# Patient Record
Sex: Female | Born: 1985 | Race: Black or African American | Hispanic: No | Marital: Single | Smoking: Never smoker
Health system: Southern US, Community
[De-identification: ages and names within clinical notes are randomized; demographics above are authoritative.]

## PROBLEM LIST (undated history)

## (undated) ENCOUNTER — Emergency Department (HOSPITAL_COMMUNITY): Admission: EM | Payer: No Typology Code available for payment source | Source: Home / Self Care

## (undated) DIAGNOSIS — F200 Paranoid schizophrenia: Secondary | ICD-10-CM

## (undated) DIAGNOSIS — I671 Cerebral aneurysm, nonruptured: Secondary | ICD-10-CM

## (undated) DIAGNOSIS — F79 Unspecified intellectual disabilities: Secondary | ICD-10-CM

## (undated) DIAGNOSIS — R011 Cardiac murmur, unspecified: Secondary | ICD-10-CM

## (undated) HISTORY — PX: BRAIN SURGERY: SHX531

---
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
## 2005-11-07 ENCOUNTER — Emergency Department (HOSPITAL_COMMUNITY): Admission: EM | Admit: 2005-11-07 | Discharge: 2005-11-07 | Payer: Self-pay | Admitting: Family Medicine

## 2007-09-28 ENCOUNTER — Emergency Department (HOSPITAL_COMMUNITY): Admission: EM | Admit: 2007-09-28 | Discharge: 2007-09-28 | Payer: Self-pay | Admitting: Emergency Medicine

## 2009-01-07 ENCOUNTER — Emergency Department (HOSPITAL_COMMUNITY): Admission: EM | Admit: 2009-01-07 | Discharge: 2009-01-08 | Payer: Self-pay | Admitting: Emergency Medicine

## 2010-07-25 ENCOUNTER — Encounter: Payer: Self-pay | Admitting: Orthopedic Surgery

## 2010-12-14 ENCOUNTER — Emergency Department (HOSPITAL_COMMUNITY)
Admission: EM | Admit: 2010-12-14 | Discharge: 2010-12-14 | Disposition: A | Discharge status: Home / Self Care | Payer: No Typology Code available for payment source | Attending: Emergency Medicine | Admitting: Emergency Medicine

## 2010-12-14 DIAGNOSIS — Y9229 Other specified public building as the place of occurrence of the external cause: Secondary | ICD-10-CM | POA: Insufficient documentation

## 2010-12-14 DIAGNOSIS — S0003XA Contusion of scalp, initial encounter: Secondary | ICD-10-CM | POA: Insufficient documentation

## 2010-12-14 DIAGNOSIS — W208XXA Other cause of strike by thrown, projected or falling object, initial encounter: Secondary | ICD-10-CM | POA: Insufficient documentation

## 2010-12-14 DIAGNOSIS — E669 Obesity, unspecified: Secondary | ICD-10-CM | POA: Insufficient documentation

## 2010-12-14 DIAGNOSIS — S1093XA Contusion of unspecified part of neck, initial encounter: Secondary | ICD-10-CM | POA: Insufficient documentation

## 2010-12-14 DIAGNOSIS — Y9301 Activity, walking, marching and hiking: Secondary | ICD-10-CM | POA: Insufficient documentation

## 2014-09-17 ENCOUNTER — Encounter (HOSPITAL_COMMUNITY): Payer: Self-pay | Admitting: *Deleted

## 2014-09-17 ENCOUNTER — Emergency Department (HOSPITAL_COMMUNITY)
Admission: EM | Admit: 2014-09-17 | Discharge: 2014-09-17 | Disposition: A | Discharge status: Home / Self Care | Payer: No Typology Code available for payment source | Attending: Emergency Medicine | Admitting: Emergency Medicine

## 2014-09-17 ENCOUNTER — Emergency Department (HOSPITAL_COMMUNITY): Payer: No Typology Code available for payment source

## 2014-09-17 DIAGNOSIS — S5001XA Contusion of right elbow, initial encounter: Secondary | ICD-10-CM | POA: Insufficient documentation

## 2014-09-17 DIAGNOSIS — Y9289 Other specified places as the place of occurrence of the external cause: Secondary | ICD-10-CM | POA: Insufficient documentation

## 2014-09-17 DIAGNOSIS — Y9389 Activity, other specified: Secondary | ICD-10-CM | POA: Insufficient documentation

## 2014-09-17 DIAGNOSIS — Z79899 Other long term (current) drug therapy: Secondary | ICD-10-CM | POA: Insufficient documentation

## 2014-09-17 DIAGNOSIS — R011 Cardiac murmur, unspecified: Secondary | ICD-10-CM | POA: Insufficient documentation

## 2014-09-17 DIAGNOSIS — Y998 Other external cause status: Secondary | ICD-10-CM | POA: Insufficient documentation

## 2014-09-17 DIAGNOSIS — S0990XA Unspecified injury of head, initial encounter: Secondary | ICD-10-CM

## 2014-09-17 DIAGNOSIS — S40022A Contusion of left upper arm, initial encounter: Secondary | ICD-10-CM

## 2014-09-17 DIAGNOSIS — S0011XA Contusion of right eyelid and periocular area, initial encounter: Secondary | ICD-10-CM | POA: Insufficient documentation

## 2014-09-17 DIAGNOSIS — Z8679 Personal history of other diseases of the circulatory system: Secondary | ICD-10-CM | POA: Insufficient documentation

## 2014-09-17 HISTORY — DX: Cerebral aneurysm, nonruptured: I67.1

## 2014-09-17 HISTORY — DX: Cardiac murmur, unspecified: R01.1

## 2014-09-17 MED ORDER — OXYCODONE-ACETAMINOPHEN 5-325 MG PO TABS
1.0000 | ORAL_TABLET | Freq: Once | ORAL | Status: AC
  Administered 2014-09-17: 1 via ORAL
  Filled 2014-09-17: qty 1
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
## 2014-09-17 NOTE — ED Notes (Signed)
Note:  She declines v.s., nor any other intervention.

## 2014-09-17 NOTE — Discharge Instructions (Signed)
Head Injury °You have received a head injury. It does not appear serious at this time. Headaches and vomiting are common following head injury. It should be easy to awaken from sleeping. Sometimes it is necessary for you to stay in the emergency department for a while for observation. Sometimes admission to the hospital may be needed. After injuries such as yours, most problems occur within the first 24 hours, but side effects may occur up to 7-10 days after the injury. It is important for you to carefully monitor your condition and contact your health care provider or seek immediate medical care if there is a change in your condition. °WHAT ARE THE TYPES OF HEAD INJURIES? °Head injuries can be as minor as a bump. Some head injuries can be more severe. More severe head injuries include: °· A jarring injury to the brain (concussion). °· A bruise of the brain (contusion). This mean there is bleeding in the brain that can cause swelling. °· A cracked skull (skull fracture). °· Bleeding in the brain that collects, clots, and forms a bump (hematoma). °WHAT CAUSES A HEAD INJURY? °A serious head injury is most likely to happen to someone who is in a car wreck and is not wearing a seat belt. Other causes of major head injuries include bicycle or motorcycle accidents, sports injuries, and falls. °HOW ARE HEAD INJURIES DIAGNOSED? °A complete history of the event leading to the injury and your current symptoms will be helpful in diagnosing head injuries. Many times, pictures of the brain, such as CT or MRI are needed to see the extent of the injury. Often, an overnight hospital stay is necessary for observation.  °WHEN SHOULD I SEEK IMMEDIATE MEDICAL CARE?  °You should get help right away if: °· You have confusion or drowsiness. °· You feel sick to your stomach (nauseous) or have continued, forceful vomiting. °· You have dizziness or unsteadiness that is getting worse. °· You have severe, continued headaches not relieved by  medicine. Only take over-the-counter or prescription medicines for pain, fever, or discomfort as directed by your health care provider. °· You do not have normal function of the arms or legs or are unable to walk. °· You notice changes in the black spots in the center of the colored part of your eye (pupil). °· You have a clear or bloody fluid coming from your nose or ears. °· You have a loss of vision. °During the next 24 hours after the injury, you must stay with someone who can watch you for the warning signs. This person should contact local emergency services (911 in the [place] if you have seizures, you become unconscious, or you are unable to wake up. °HOW CAN I PREVENT A HEAD INJURY IN THE FUTURE? °The most important factor for preventing major head injuries is avoiding motor vehicle accidents.  To minimize the potential for damage to your head, it is crucial to wear seat belts while riding in motor vehicles. Wearing helmets while bike riding and playing collision sports (like football) is also helpful. Also, avoiding dangerous activities around the house will further help reduce your risk of head injury.  °WHEN CAN I RETURN TO NORMAL ACTIVITIES AND ATHLETICS? °You should be reevaluated by your health care provider before returning to these activities. If you have any of the following symptoms, you should not return to activities or contact sports until 1 week after the symptoms have stopped: °· Persistent headache. °· Dizziness or vertigo. °· Poor attention and concentration. °· Confusion. °·   Memory problems. °· Nausea or vomiting. °· Fatigue or tire easily. °· Irritability. °· Intolerant of bright lights or loud noises. °· Anxiety or depression. °· Disturbed sleep. °MAKE SURE YOU:  °· Understand these instructions. °· Will watch your condition. °· Will get help right away if you are not doing well or get worse. °Document Released: 06/20/2005 Document Revised: 06/25/2013 Document Reviewed:  02/25/2013 °ExitCare® Patient Information ©2015 [employer]. This information is not intended to replace advice given to you by your health care provider. Make sure you discuss any questions you have with your health care provider. ° ° °Emergency Department Resource Guide °1) Find a Doctor and Pay Out of Pocket °Although you won't have to find out who is covered by your insurance plan, it is a good idea to ask around and get recommendations. You will then need to call the office and see if the doctor you have chosen will accept you as a new patient and what types of options they offer for patients who are self-pay. Some doctors offer discounts or will set up payment plans for their patients who do not have insurance, but you will need to ask so you aren't surprised when you get to your appointment. ° °2) Contact Your Local Health Department °Not all health departments have doctors that can see patients for sick visits, but many do, so it is worth a call to see if yours does. If you don't know where your local health department is, you can check in your phone book. The CDC also has a tool to help you locate your state's health department, and many state websites also have listings of all of their local health departments. ° °3) Find a Walk-in Clinic °If your illness is not likely to be very severe or complicated, you may want to try a walk in clinic. These are popping up all over the country in pharmacies, drugstores, and shopping centers. They're usually staffed by nurse practitioners or physician assistants that have been trained to treat common illnesses and complaints. They're usually fairly quick and inexpensive. However, if you have serious medical issues or chronic medical problems, these are probably not your best option. ° °No Primary Care Doctor: °- Call [facility] at  832-8000 - they can help you locate a primary care doctor that  accepts your insurance, provides certain services, etc. °- Physician  Referral Service- 1-800-533-3463 ° °Chronic Pain Problems: °Organization         Address  Phone   Notes  °[facility]  (336) 297-2271 Patients need to be referred by their primary care doctor.  ° °Medication Assistance: °Organization         Address  Phone   Notes  °[place] [facility] [address] °[place], [place] [address] (336) 641-8030 --Must be a resident of [place] °-- Must have NO insurance coverage whatsoever (no Medicaid/ Medicare, etc.) °-- The pt. MUST have a primary care doctor that directs their care regularly and follows them in the community °  °[facility]  (866) 331-1348   °[employer]  (888) 892-1162   ° °Agencies that provide inexpensive medical care: °Organization         Address  Phone   Notes  °[facility]  (336) 832-8035   °[facility]    (336) 832-7272   °[facility] [address] °[place], [place] [address] (336) 832-4777   °[facility] [address], °[place] (336) 271-4999   °[facility]    (336) 373-0678   °[facility]    (336) 272-1050   °[facility] ° [address], [place] Phone:  (336) 832-4444, Fax:  (336) 832-4440 Hours of Operation:  9 am - 6 pm, M-F.  Also accepts Medicaid/Medicare and self-pay.  °[facility] ° [address], [place] Phone: (336) 832-3150, Fax: (336) 832-3151. Hours of Operation:  8:30 am - 5:30 pm, M-F.  Also accepts Medicaid and self-pay.  °[facility] [place] [address], [place] Phone: (336) 878-6027   °[facility] [address], [place], [place] (336)723-1848, Ext. 123 Mondays & Thursdays: 7-9 AM.  First 15 patients are seen on a first come, first serve basis. °  ° °Medicaid-accepting [place] Providers: ° °Organization         Address  Phone   Notes  °[facility] [address], [place] (336) 641-2100 Also accepts self-pay patients.  °[facility] [address], [place] ° (336) 856-9996   °[facility] [address], [place] (336) 288-8857   °[facility] [address] [place] [address], [place] (336) 299-7000   °Veita Bland [address], No Name  ° (336) 373-1557 Only accepts [place] Access Medicaid patients after they have their name applied to their card.  ° °Self-Pay (no insurance) in [place]: ° °Organization         Address  Phone   Notes  °Sickle Cell Patients, [facility] [address], [place] (336) 832-1970   °[facility] [address], [place] (336) 832-4400   ° [facility] ° [address], [place] (336) 992-4800   °[facility]/Dr. Osei-Bonsu ° [address] [place] [address], [place] or [address], [place] (336) [phone] Phone number for both [place] and [place] locations is the same.  °[facility] [address], [place] (336) 299-0000   °[facility] [address] [place] [address], [place] or [address] (336) 852-7530 °(336) 878-2260   °[facility] [address], [place] (336) 350-1642, phone; (336) 294-5005, fax Sees patients 1st and 3rd Saturday of every month.  Must not qualify for public or private insurance (i.e. Medicaid, Medicare, [place] Health Choice, Veterans' Benefits) • Household income should be no more than 200% of the poverty level •The clinic cannot treat you if you are pregnant or think you are pregnant • Sexually transmitted diseases are not treated at the clinic.  ° ° °Dental Care: °Organization         Address  Phone  Notes  °[facility] [address], [place] (336) 641-6152 Accepts children up to age 21 who are enrolled  in Medicaid or [place] Health Choice; pregnant women with a Medicaid card; and children who have applied for Medicaid or [place] Health Choice, but were declined, whose parents can pay a reduced fee at time of service.  °[facility] [place]  [address], [place] (336) 641-7733 Accepts children up to age 21 who are enrolled in Medicaid or [place] Health Choice; pregnant women with a Medicaid card; and children who have applied for   Medicaid or [place] Health Choice, but were declined, whose parents can pay a reduced fee at time of service.  °[facility] ° [address], [place] (336) 641-4533 Patients are seen by appointment only. Walk-ins are not accepted. [facility] will see patients 18 years of age and older. °Monday - Tuesday (8am-5pm) °Most Wednesdays (8:30-5pm) °$30 per visit, cash only  °[facility] ° [address], [place] (336) 641-4533 Patients are seen by appointment only. Walk-ins are not accepted. [facility] will see patients 18 years of age and older. °One Wednesday Evening (Monthly: Volunteer Based).  $30 per visit, cash only  °[facility]  (919) 537-3737 for adults; Children under age 4, call Graduate Pediatric Dentistry at (919) [phone]. Children aged 4-14, please call (919) [phone] to request a pediatric application. ° Dental services are provided in all areas of dental care including fillings, crowns and bridges, complete and partial dentures, implants, gum treatment, root canals, and extractions. Preventive care is also provided. Treatment is provided to both adults and children. °Patients are selected via a lottery and there is often a waiting list. °  °[facility] [address], °[place] ° (336) 763-8833 [email or web address] °  °[facility] [address], [place], [place] (336)723-1848, Ext. 123 Second and Fourth Thursday of each month, opens at  6:30 AM; Clinic ends at 9 AM.  Patients are seen on a first-come first-served basis, and a limited number are seen during each clinic.  ° °[facility] ° [address], [place], [place] (336) 723-7904   Eligibility Requirements °You must have lived in [place], [place], or [place] counties for at least the last three months. °  You cannot be eligible for state or federal sponsored healthcare insurance, including Veterans Administration, Medicaid, or Medicare. °  You generally cannot be eligible for healthcare insurance through your employer.  °  How to apply: °Eligibility screenings are held every Tuesday and Wednesday afternoon from 1:00 pm until 4:00 pm. You do not need an appointment for the interview!  °[facility] [address], [place], [place] 336-631-2330   °[facility]  336-342-8273   °[facility]  336-703-3100   °[facility]  336-570-6415   ° °Behavioral Health Resources in the Community: °Intensive Outpatient Programs °Organization         Address  Phone  Notes  °[facility] [address], [place], [place] 336-878-6098   °[facility] [address], [place], [place] 336-832-9800   °[facility] [address], [place], [place] ° 336-882-2125   °[facility] [address],  °[place], [place] 1-800-853-5163 or 336-641-4981   °Substance Abuse Resources °Organization         Address  Phone  Notes  °[facility]  336-882-2125   °[facility]  336-784-9470   °[facility]  336-285-9073   °[facility]  336-845-3988   °Residential & Outpatient Substance Abuse Program  1-800-659-3381   °Psychological Services °Organization         Address  Phone  Notes  °[facility]  336- 832-9600   °[facility]  336- 378-7881   °[facility] [address],   1-800-853-5163 or 336-641-4981   ° °Mobile Crisis Teams °Organization         Address    Phone  Notes  °[facility], Mobile Crisis Care Unit  1-877-626-1772   °Assertive °[facility] ° [address] [place], [place] 336-834-9664   °Sharon DeEsch [address] °[place] 336-554-5454   ° °Self-Help/Support Groups °Organization         Address  Phone             Notes  °[facility] [place] - variety of support groups  336- 373-1402 Call for more information  °Narcotics Anonymous (NA), [facility] [address], °[place]  2 meetings at this location  ° °Residential Treatment Programs °Organization         Address  Phone  Notes  °[facility] [address],    °[place]  1-866-801-8205   °[facility] ° [address], [place], [place] 704-293-8524   °[facility] [address], [place] 336-845-3988 Admissions: 8am-3pm M-F  °[facility] [address]    °[place], [place] 336-841-1104   °[facility] [address], [place], [place] 336-379-7146   °[facility] [address]  °[place], [place] 336-285-9073   °[facility] - Intensive Outpatient [address], [place], [place] 336-852-3033   °[facility] [address]  °[place], [place] 1-877-615-2722 or 336-784-9470   °[facility] [address] [place], [place] 336-227-7417 Accepts Medicaid  °[facility] [address]  °[place] 1-800-659-3381 Substance Abuse/Addiction Treatment  ° °[place] Behavioral Health Resources °Organization         Address  Phone  Notes  °[facility]  (888) 581-9988   °Julie Brannon, PhD [address] [place], [place]   (336) 349-5553 or (336) 951-0000   °[facility]   [address] °[place], [place] (336) 349-4454   °[facility]  [address], [place], [place] (336) 342-8316 Insurance/Medicaid/sponsorship through [facility]  °[facility] [address]                                    [place], [place] (336) 342-8316 Therapy/tele-psych/case  °[facility] [address]  ° [place], [place] (336) 349-2233    °Dr. Arfeen  (336) 349-4544   °[facility] [place]  [employer] [place] [facility] 1) [address], [place] °2) [address], [place] °3)  [address], [place] (336) 349-3220 °(336) 342-7768 ° °(336) 342-8140   °[place] [facility] (336) 342-1394 or (336) 342-3537 (After Hours)    ° ° ° °

## 2015-02-09 ENCOUNTER — Emergency Department (HOSPITAL_COMMUNITY)
Admission: EM | Admit: 2015-02-09 | Discharge: 2015-02-09 | Disposition: A | Discharge status: Home / Self Care | Payer: Medicaid Other | Attending: Emergency Medicine | Admitting: Emergency Medicine

## 2015-02-09 ENCOUNTER — Encounter (HOSPITAL_COMMUNITY): Payer: Self-pay | Admitting: Emergency Medicine

## 2015-02-09 ENCOUNTER — Emergency Department (HOSPITAL_COMMUNITY): Payer: Medicaid Other

## 2015-02-09 DIAGNOSIS — Y998 Other external cause status: Secondary | ICD-10-CM | POA: Insufficient documentation

## 2015-02-09 DIAGNOSIS — Y9389 Activity, other specified: Secondary | ICD-10-CM | POA: Insufficient documentation

## 2015-02-09 DIAGNOSIS — W228XXA Striking against or struck by other objects, initial encounter: Secondary | ICD-10-CM | POA: Diagnosis not present

## 2015-02-09 DIAGNOSIS — Z8679 Personal history of other diseases of the circulatory system: Secondary | ICD-10-CM | POA: Diagnosis not present

## 2015-02-09 DIAGNOSIS — Z79899 Other long term (current) drug therapy: Secondary | ICD-10-CM | POA: Diagnosis not present

## 2015-02-09 DIAGNOSIS — S8992XA Unspecified injury of left lower leg, initial encounter: Secondary | ICD-10-CM | POA: Diagnosis present

## 2015-02-09 DIAGNOSIS — R011 Cardiac murmur, unspecified: Secondary | ICD-10-CM | POA: Diagnosis not present

## 2015-02-09 DIAGNOSIS — Z23 Encounter for immunization: Secondary | ICD-10-CM | POA: Diagnosis not present

## 2015-02-09 DIAGNOSIS — Y92513 Shop (commercial) as the place of occurrence of the external cause: Secondary | ICD-10-CM | POA: Insufficient documentation

## 2015-02-09 DIAGNOSIS — S91312A Laceration without foreign body, left foot, initial encounter: Secondary | ICD-10-CM | POA: Insufficient documentation

## 2015-02-09 MED ORDER — TETANUS-DIPHTH-ACELL PERTUSSIS 5-2.5-18.5 LF-MCG/0.5 IM SUSP
0.5000 mL | Freq: Once | INTRAMUSCULAR | Status: AC
  Administered 2015-02-09: 0.5 mL via INTRAMUSCULAR
  Filled 2015-02-09: qty 0.5

## 2015-02-09 MED ORDER — NAPROXEN 500 MG PO TABS
500.0000 mg | ORAL_TABLET | Freq: Once | ORAL | Status: AC
  Administered 2015-02-09: 500 mg via ORAL
  Filled 2015-02-09: qty 1

## 2015-02-09 MED ORDER — NAPROXEN 500 MG PO TABS: 500.0000 mg | ORAL_TABLET | Freq: Two times a day (BID) | ORAL | Status: DC
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
## 2015-02-09 NOTE — ED Notes (Signed)
Dressing applied to L foot per EDPA request
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
## 2015-02-09 NOTE — ED Notes (Signed)
To radiology

## 2015-02-09 NOTE — ED Notes (Signed)
Steri-strips given to provider

## 2015-02-17 ENCOUNTER — Emergency Department (HOSPITAL_COMMUNITY)
Admission: EM | Admit: 2015-02-17 | Discharge: 2015-02-17 | Disposition: A | Discharge status: Home / Self Care | Payer: Medicaid Other | Attending: Emergency Medicine | Admitting: Emergency Medicine

## 2015-02-17 ENCOUNTER — Encounter (HOSPITAL_COMMUNITY): Payer: Self-pay | Admitting: Emergency Medicine

## 2015-02-17 DIAGNOSIS — Z791 Long term (current) use of non-steroidal anti-inflammatories (NSAID): Secondary | ICD-10-CM | POA: Insufficient documentation

## 2015-02-17 DIAGNOSIS — S91312D Laceration without foreign body, left foot, subsequent encounter: Secondary | ICD-10-CM | POA: Diagnosis present

## 2015-02-17 DIAGNOSIS — Z8679 Personal history of other diseases of the circulatory system: Secondary | ICD-10-CM | POA: Diagnosis not present

## 2015-02-17 DIAGNOSIS — S91319A Laceration without foreign body, unspecified foot, initial encounter: Secondary | ICD-10-CM

## 2015-02-17 DIAGNOSIS — R011 Cardiac murmur, unspecified: Secondary | ICD-10-CM | POA: Diagnosis not present

## 2015-02-17 DIAGNOSIS — W228XXD Striking against or struck by other objects, subsequent encounter: Secondary | ICD-10-CM | POA: Insufficient documentation

## 2015-02-17 DIAGNOSIS — Z79899 Other long term (current) drug therapy: Secondary | ICD-10-CM | POA: Diagnosis not present

## 2015-02-17 NOTE — ED Notes (Signed)
Patient was very upset and verbally abusive to staff.   Patient was okay with this RN.  She did allow me to put bacitracin on the wound, cover with gauze and place kerlix around the wound.   Patient did accept 2 small packets of bacitracin which she left on the floor.

## 2015-02-17 NOTE — ED Notes (Signed)
Patient states ankle pain, swelling to L ankle from old laceration.   No s/s of infection.   Patient very upset when I walked in the room with PA and other staff.   Patient did cooperate with this RN, but noone else that came into the room.   Patient states "this is serious business and I need a piece of paper saying this is a permanent injury".

## 2015-02-17 NOTE — Discharge Instructions (Signed)
Anytime you have a cut, it can leave a scar. Follow the directions to help minimize the appearance.  Scar Minimization You will have a scar anytime you have surgery and a cut is made in the skin or you have something removed from your skin (mole, skin cancer, cyst). Although scars are unavoidable following surgery, there are ways to minimize their appearance. It is important to follow all the instructions you receive from your caregiver about wound care. How your wound heals will influence the appearance of your scar. If you do not follow the wound care instructions as directed, complications such as infection may occur. Wound instructions include keeping the wound clean, moist, and not letting the wound form a scab. Some people form scars that are raised and lumpy (hypertrophic) or larger than the initial wound (keloidal). HOME CARE INSTRUCTIONS  Follow wound care instructions as directed. Keep the wound clean by washing it with soap and water. Keep the wound moist with provided antibiotic cream or petroleum jelly until completely healed. Moisten twice a day for about 2 weeks. Get stitches (sutures) taken out at the scheduled time. Avoid touching or manipulating your wound unless needed. Wash your hands thoroughly before and after touching your wound. Follow all restrictions such as limits on exercise or work. This depends on where your scar is located. Keep the scar protected from sunburn. Cover the scar with sunscreen/sunblock with SPF 30 or higher. Gently massage the scar using a circular motion to help minimize the appearance of the scar. Do this only after the wound has closed and all the sutures have been removed. For hypertrophic or keloidal scars, there are several ways to treat and minimize their appearance. Methods include compression therapy, intralesional corticosteroids, laser therapy, or surgery. These methods are performed by your caregiver. Remember that the scar may appear lighter or  darker than your normal skin color. This difference in color should even out with time. SEEK MEDICAL CARE IF:  You have a fever. You develop signs of infection such as pain, redness, pus, and warmth. You have questions or concerns. Document Released: 12/08/2009 Document Revised: 09/12/2011 Document Reviewed: 12/08/2009 [facility] Patient Information 2015 [place], [place]. This information is not intended to replace advice given to you by your health care provider. Make sure you discuss any questions you have with your health care provider. Wound Care Wound care helps prevent pain and infection.  You may need a tetanus shot if:  You cannot remember when you had your last tetanus shot.  You have never had a tetanus shot.  The injury broke your skin. If you need a tetanus shot and you choose not to have one, you may get tetanus. Sickness from tetanus can be serious. HOME CARE   Only take medicine as told by your doctor.  Clean the wound daily with mild soap and water.  Change any bandages (dressings) as told by your doctor.  Put medicated cream and a bandage on the wound as told by your doctor.  Change the bandage if it gets wet, dirty, or starts to smell.  Take showers. Do not take baths, swim, or do anything that puts your wound under water.  Rest and raise (elevate) the wound until the pain and puffiness (swelling) are better.  Keep all doctor visits as told. GET HELP RIGHT AWAY IF:   Yellowish-white fluid (pus) comes from the wound.  Medicine does not lessen your pain.  There is a red streak going away from the wound.  You have  a fever. MAKE SURE YOU:   Understand these instructions.  Will watch your condition.  Will get help right away if you are not doing well or get worse. Document Released: 03/29/2008 Document Revised: 09/12/2011 Document Reviewed: 10/24/2010 [facility] Patient Information 2015 [place], [place]. This information is not intended to replace advice  given to you by your health care provider. Make sure you discuss any questions you have with your health care provider.
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
## 2015-04-22 ENCOUNTER — Ambulatory Visit: Payer: Self-pay | Admitting: Internal Medicine

## 2015-04-27 ENCOUNTER — Encounter (HOSPITAL_COMMUNITY): Payer: Self-pay | Admitting: Emergency Medicine

## 2015-04-27 ENCOUNTER — Emergency Department (HOSPITAL_COMMUNITY): Payer: Medicaid Other

## 2015-04-27 ENCOUNTER — Emergency Department (HOSPITAL_COMMUNITY)
Admission: EM | Admit: 2015-04-27 | Discharge: 2015-04-28 | Disposition: A | Discharge status: Home / Self Care | Payer: Medicaid Other | Attending: Emergency Medicine | Admitting: Emergency Medicine

## 2015-04-27 DIAGNOSIS — Z3202 Encounter for pregnancy test, result negative: Secondary | ICD-10-CM | POA: Insufficient documentation

## 2015-04-27 DIAGNOSIS — R519 Headache, unspecified: Secondary | ICD-10-CM

## 2015-04-27 DIAGNOSIS — R4182 Altered mental status, unspecified: Secondary | ICD-10-CM | POA: Diagnosis not present

## 2015-04-27 DIAGNOSIS — R011 Cardiac murmur, unspecified: Secondary | ICD-10-CM | POA: Insufficient documentation

## 2015-04-27 DIAGNOSIS — F22 Delusional disorders: Secondary | ICD-10-CM | POA: Diagnosis present

## 2015-04-27 DIAGNOSIS — F79 Unspecified intellectual disabilities: Secondary | ICD-10-CM

## 2015-04-27 DIAGNOSIS — Z8679 Personal history of other diseases of the circulatory system: Secondary | ICD-10-CM | POA: Insufficient documentation

## 2015-04-27 DIAGNOSIS — R51 Headache: Secondary | ICD-10-CM | POA: Diagnosis not present

## 2015-04-27 DIAGNOSIS — Z79899 Other long term (current) drug therapy: Secondary | ICD-10-CM | POA: Insufficient documentation

## 2015-04-27 DIAGNOSIS — R079 Chest pain, unspecified: Secondary | ICD-10-CM | POA: Diagnosis not present

## 2015-04-27 HISTORY — DX: Unspecified intellectual disabilities: F79

## 2015-04-27 LAB — URINALYSIS, ROUTINE W REFLEX MICROSCOPIC
BILIRUBIN URINE: NEGATIVE
Glucose, UA: NEGATIVE mg/dL
HGB URINE DIPSTICK: NEGATIVE
Ketones, ur: NEGATIVE mg/dL
Leukocytes, UA: NEGATIVE
NITRITE: NEGATIVE
PROTEIN: NEGATIVE mg/dL
Specific Gravity, Urine: 1.017 (ref 1.005–1.030)
Urobilinogen, UA: 0.2 mg/dL (ref 0.0–1.0)
pH: 6 (ref 5.0–8.0)

## 2015-04-27 LAB — CBC WITH DIFFERENTIAL/PLATELET
BASOS ABS: 0 10*3/uL (ref 0.0–0.1)
Basophils Relative: 0 %
Eosinophils Absolute: 0 10*3/uL (ref 0.0–0.7)
Eosinophils Relative: 1 %
HEMATOCRIT: 34.4 % — AB (ref 36.0–46.0)
Hemoglobin: 11.4 g/dL — ABNORMAL LOW (ref 12.0–15.0)
LYMPHS PCT: 50 %
Lymphs Abs: 2.2 10*3/uL (ref 0.7–4.0)
MCH: 30 pg (ref 26.0–34.0)
MCHC: 33.1 g/dL (ref 30.0–36.0)
MCV: 90.5 fL (ref 78.0–100.0)
Monocytes Absolute: 0.4 10*3/uL (ref 0.1–1.0)
Monocytes Relative: 10 %
NEUTROS ABS: 1.7 10*3/uL (ref 1.7–7.7)
Neutrophils Relative %: 39 %
PLATELETS: 365 10*3/uL (ref 150–400)
RBC: 3.8 MIL/uL — AB (ref 3.87–5.11)
RDW: 13.5 % (ref 11.5–15.5)
WBC: 4.3 10*3/uL (ref 4.0–10.5)

## 2015-04-27 LAB — RAPID URINE DRUG SCREEN, HOSP PERFORMED
AMPHETAMINES: NOT DETECTED
Barbiturates: NOT DETECTED
Benzodiazepines: NOT DETECTED
COCAINE: NOT DETECTED
OPIATES: NOT DETECTED
TETRAHYDROCANNABINOL: NOT DETECTED

## 2015-04-27 LAB — POC URINE PREG, ED: PREG TEST UR: NEGATIVE

## 2015-04-27 LAB — COMPREHENSIVE METABOLIC PANEL
ALK PHOS: 41 U/L (ref 38–126)
ALT: 14 U/L (ref 14–54)
AST: 32 U/L (ref 15–41)
Albumin: 3.3 g/dL — ABNORMAL LOW (ref 3.5–5.0)
Anion gap: 8 (ref 5–15)
BUN: 10 mg/dL (ref 6–20)
CO2: 24 mmol/L (ref 22–32)
CREATININE: 0.68 mg/dL (ref 0.44–1.00)
Calcium: 9.1 mg/dL (ref 8.9–10.3)
Chloride: 108 mmol/L (ref 101–111)
GFR calc non Af Amer: >60 mL/min (ref >60)
GLUCOSE: 81 mg/dL (ref 65–99)
Potassium: 4.1 mmol/L (ref 3.5–5.1)
SODIUM: 140 mmol/L (ref 135–145)
TOTAL PROTEIN: 7 g/dL (ref 6.5–8.1)
Total Bilirubin: 0.8 mg/dL (ref 0.3–1.2)

## 2015-04-27 LAB — I-STAT TROPONIN, ED: Troponin i, poc: 0.01 ng/mL (ref 0.00–0.08)

## 2015-04-27 LAB — ETHANOL

## 2015-04-27 MED ORDER — DIPHENHYDRAMINE HCL 25 MG PO CAPS
25.0000 mg | ORAL_CAPSULE | Freq: Once | ORAL | Status: AC
  Administered 2015-04-27: 25 mg via ORAL
  Filled 2015-04-27: qty 1

## 2015-04-27 MED ORDER — SODIUM CHLORIDE 0.9 % IV BOLUS (SEPSIS)
1000.0000 mL | Freq: Once | INTRAVENOUS | Status: AC
  Administered 2015-04-27: 1000 mL via INTRAVENOUS

## 2015-04-27 MED ORDER — METOCLOPRAMIDE HCL 10 MG PO TABS
5.0000 mg | ORAL_TABLET | Freq: Once | ORAL | Status: AC
  Administered 2015-04-27: 5 mg via ORAL
  Filled 2015-04-27: qty 1
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
## 2015-04-27 NOTE — ED Notes (Signed)
Pt remains monitored by blood pressure, pulse ox, and 12 lead.  

## 2015-04-27 NOTE — ED Notes (Signed)
Pt remains monitored by blood pressure and pulse ox.  
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
## 2015-04-27 NOTE — ED Notes (Signed)
Pt remains monitored by blood pressure, pulse ox, and 5 lead.  
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
## 2015-04-28 ENCOUNTER — Encounter (HOSPITAL_COMMUNITY): Payer: Self-pay | Admitting: *Deleted

## 2015-04-28 DIAGNOSIS — F22 Delusional disorders: Secondary | ICD-10-CM | POA: Diagnosis present

## 2015-04-28 DIAGNOSIS — F79 Unspecified intellectual disabilities: Secondary | ICD-10-CM

## 2015-04-28 LAB — SALICYLATE LEVEL: Salicylate Lvl: <4 mg/dL (ref 2.8–30.0)

## 2015-04-28 LAB — ACETAMINOPHEN LEVEL: Acetaminophen (Tylenol), Serum: <10 ug/mL — ABNORMAL LOW (ref 10–30)

## 2015-04-28 MED ORDER — METOCLOPRAMIDE HCL 10 MG PO TABS
5.0000 mg | ORAL_TABLET | Freq: Once | ORAL | Status: AC
  Administered 2015-04-28: 5 mg via ORAL
  Filled 2015-04-28: qty 1

## 2015-04-28 MED ORDER — DIPHENHYDRAMINE HCL 25 MG PO CAPS
25.0000 mg | ORAL_CAPSULE | Freq: Once | ORAL | Status: AC
  Administered 2015-04-28: 25 mg via ORAL
  Filled 2015-04-28: qty 1

## 2015-04-28 MED ORDER — ACETAMINOPHEN 325 MG PO TABS
650.0000 mg | ORAL_TABLET | Freq: Once | ORAL | Status: AC
  Administered 2015-04-28: 650 mg via ORAL
  Filled 2015-04-28: qty 2

## 2015-04-28 NOTE — Discharge Instructions (Signed)
General Headache  Your headache does not appear to be life threatening or to need any further emergent intervention.  Take tylenol/motrin/excedrin/benadryl as needed over the counter for your headache and follow up with a primary care physician outpatient.  A headache is pain or discomfort felt around the head or neck area. The specific cause of a headache may not be found. There are many causes and types of headaches. A few common ones are:  Tension headaches.  Migraine headaches.  Cluster headaches.  Chronic daily headaches. HOME CARE INSTRUCTIONS  Watch your condition for any changes. Take these steps to help with your condition: Managing Pain  Take over-the-counter and prescription medicines only as told by your health care provider.  Lie down in a dark, quiet room when you have a headache.  If directed, apply ice to the head and neck area:  Put ice in a plastic bag.  Place a towel between your skin and the bag.  Leave the ice on for 20 minutes, 2-3 times per day.  Use a heating pad or hot shower to apply heat to the head and neck area as told by your health care provider.  Keep lights dim if bright lights bother you or make your headaches worse. Eating and Drinking  Eat meals on a regular schedule.  Limit alcohol use.  Decrease the amount of caffeine you drink, or stop drinking caffeine. General Instructions  Keep all follow-up visits as told by your health care provider. This is important.  Keep a headache journal to help find out what may trigger your headaches. For example, write down:  What you eat and drink.  How much sleep you get.  Any change to your diet or medicines.  Try massage or other relaxation techniques.  Limit stress.  Sit up straight, and do not tense your muscles.  Do not use tobacco products, including cigarettes, chewing tobacco, or e-cigarettes. If you need help quitting, ask your health care provider.  Exercise regularly as told  by your health care provider.  Sleep on a regular schedule. Get 7-9 hours of sleep, or the amount recommended by your health care provider. SEEK MEDICAL CARE IF:   Your symptoms are not helped by medicine.  You have a headache that is different from the usual headache.  You have nausea or you vomit.  You have a fever. SEEK IMMEDIATE MEDICAL CARE IF:   Your headache becomes severe.  You have repeated vomiting.  You have a stiff neck.  You have a loss of vision.  You have problems with speech.  You have pain in the eye or ear.  You have muscular weakness or loss of muscle control.  You lose your balance or have trouble walking.  You feel faint or pass out.  You have confusion.   This information is not intended to replace advice given to you by your health care provider. Make sure you discuss any questions you have with your health care provider.   Document Released: 06/20/2005 Document Revised: 03/11/2015 Document Reviewed: 10/13/2014 Elsevier Interactive Patient Education 2016 [employer].  Psychosis  You need to follow up outpatient with the psychiatric resources provided to you by social work.  It is very important to your health that this be done.    Psychosis refers to a severe loss of contact with reality. During a psychotic episode, a person is not able to think clearly, and his or her emotions and responses do not match up with what is actually happening.  Someone may have false beliefs about what is happening or who they are (delusions). Someone may see, hear, taste, smell, or feel things that are not present (hallucinations).  Psychosis usually occurs with very serious mental health (psychiatric) conditions such as schizophrenia, bipolar disorder, or major depression. It can sometimes also be the result of drug use or certain medical conditions. SYMPTOMS Symptoms of a psychotic episode include:  Delusions, such as:  Feeling excessive fear or suspicion  (paranoia).  Believing something that is odd, unrealistic, or false, such as having a false belief about being someone else.  Hallucinations.  Disorganized thinking, such as thoughts that jump from one to another that do not make sense to others.  Disorganized speech, such as saying things that do not make sense to others.  Inappropriate behavior, such as talking to oneself or intruding on unfamiliar people. DIAGNOSIS A diagnosis of psychosis is made through an assessment by a health care provider, who will ask questions about thoughts, feelings, behavior, drug use, and medical conditions. The health care provider may also do one or more of the following:  Physical exam.  Blood tests.  Brain imaging, such as a CT scan or MRI.  Brain wave study (EEG). The health care provider may make a referral for further evaluation by a mental health professional. TREATMENT  Treatment depends on the cause of the psychosis. Treatment may include one or more of the following:  Monitoring and supportive care in the emergency room or hospital.   Taking medicines (antipsychotic medicine) to reduce symptoms and to balance chemicals in the brain.  Treating an underlying medical condition.  Stopping or reducing drugs that are causing psychosis.  Therapy and other supportive programs outside of the hospital. HOME CARE INSTRUCTIONS  Over-the-counter and prescription medicines should be taken only as told by the health care provider.  The health care provider should be consulted before over-the-counter medicines, herbs, or supplements are used.  All follow-up visits should be kept as told by the health care provider. This is important.  A healthy lifestyle should be maintained. This includes:  Eating a healthy diet.  Getting enough sleep.  Exercising regularly.  Avoiding alcohol and recreational drugs as told by the health care provider. SEEK MEDICAL CARE IF:  Medicines do not seem to be  helping.  The person hears voices telling him or her to do things.  The person continues to see, smell, or feel things that are not there.  The person feels extremely fearful and suspicious that someone or something will harm him or her.  The person feels unable to leave his or her house.  The person has trouble taking care of himself or herself.  The person experiences side effects of medicines, such as:  Changes in sleep patterns.  Dizziness.  Weight gain.  Restlessness.  Movement changes.  Muscle spasms.  Tremors. SEEK IMMEDIATE MEDICAL CARE IF:  Serious thoughts occur about self-harm or about hurting others.  There are serious side effects of medicine, such as:  Swelling of the face, lips, tongue, or throat.  Fever, confusion, muscle spasms, or seizures.   This information is not intended to replace advice given to you by your health care provider. Make sure you discuss any questions you have with your health care provider.   Document Released: 12/08/2009 Document Revised: 11/04/2014 Document Reviewed: 06/24/2014 Elsevier Interactive Patient Education [employer] Inc.

## 2015-04-28 NOTE — ED Notes (Signed)
Left message for pt's brother to call so may pick up pt, per pt's request. Pt asked to wait in lobby.

## 2015-04-28 NOTE — ED Provider Notes (Signed)
Patient seen at bedside.  Well appearing.  Notes reviewed.  Patient cleared by psychiatry and SW directed her for outpatient follow up.  Patient directed to use Benadryl/Tylenol/Motrin as needed for headache at home.  Patient expressed understanding of plan and said she will follow up with outpatient psychiatric services.  Discharged in stable condition.  Leta BaptistEmily Roe Courteney Alderete, MD 04/28/15 [phone]
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
## 2015-04-28 NOTE — ED Notes (Signed)
Dahlia ClientHannah, SW, on phone w/pt's mother on speaker w/pt present.
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
## 2015-04-28 NOTE — ED Notes (Signed)
Pt requesting pain medication for "my brain injury and I need my heart medicine." Pt unable to name medications prescribed to her.

## 2015-04-28 NOTE — ED Notes (Signed)
Pt requesting to be given medicine prescriptions when she is d/c'd. Advised pt EDP will discuss w/her prior to discharging. Voiced understanding.

## 2015-04-28 NOTE — Progress Notes (Signed)
LCSW spoke with mother via phone with patient. Mother agreeable to have patient come back home and her brother Cloretta NedQuan will come and pick her up.  Patient asking for SCAT information to arrange transport. LCSW will also assist patient with PCP information as she has no insurance and no follow up and she is wanting a Insurance account managereurologist as well as a Development worker, international aidCardiologist for her chronic problems. [facility].  No inpatient acute need at this time. All referrals given for outpatient services and patient voices understanding and follow up.  Wendy EmoryHannah Nasira Janusz LCSW, MSW Clinical Social Work: Emergency Room [phone]

## 2015-04-28 NOTE — ED Notes (Signed)
TTS at bedside. 

## 2015-04-28 NOTE — ED Notes (Signed)
Pt called out from room asking about results and "what's causing my pain right here at my temple." Informed pt of normal results. Pt requesting benadryl and Reglan "like what I got when I first got here."
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
## 2015-04-28 NOTE — ED Notes (Signed)
Patient was given a snack and drink at snack time, Regular diet order taken.

## 2015-04-28 NOTE — ED Notes (Addendum)
Pt on phone at nurses' desk. Graham crackers given as requested.

## 2015-05-27 ENCOUNTER — Emergency Department (HOSPITAL_COMMUNITY)
Admission: EM | Admit: 2015-05-27 | Discharge: 2015-06-02 | Disposition: A | Discharge status: Home / Self Care | Payer: Medicaid Other | Attending: Emergency Medicine | Admitting: Emergency Medicine

## 2015-05-27 ENCOUNTER — Encounter (HOSPITAL_COMMUNITY): Payer: Self-pay | Admitting: *Deleted

## 2015-05-27 DIAGNOSIS — R451 Restlessness and agitation: Secondary | ICD-10-CM | POA: Diagnosis not present

## 2015-05-27 DIAGNOSIS — F22 Delusional disorders: Secondary | ICD-10-CM | POA: Diagnosis not present

## 2015-05-27 DIAGNOSIS — Z046 Encounter for general psychiatric examination, requested by authority: Secondary | ICD-10-CM | POA: Diagnosis not present

## 2015-05-27 DIAGNOSIS — Z8679 Personal history of other diseases of the circulatory system: Secondary | ICD-10-CM | POA: Insufficient documentation

## 2015-05-27 DIAGNOSIS — R011 Cardiac murmur, unspecified: Secondary | ICD-10-CM | POA: Diagnosis not present

## 2015-05-27 DIAGNOSIS — F419 Anxiety disorder, unspecified: Secondary | ICD-10-CM | POA: Insufficient documentation

## 2015-05-27 NOTE — ED Notes (Signed)
Pt arrives to the ER via [employer] under IVC; per IVC papers pt has been mentally unstable and aggressive since her brain surgery; per mother pt has not been eating or sleeping; Per IVC papers pt has been destroying the cushions with knives, throwing canned goods, broke a glass coffee table, and throwing other items across the house; per papers pt has been throwing away her disabled mother's food and has been swinging knifes at her family; Mother is afraid that she is in danger and will not be able to call for help if she needs it; per reports pt has called 911 numerous times today for false reasons; pt is not willing to answer questions; upon talking to pt she will say " you have the cameras, you can look at them and see all you need"; pt believes that her house is under surveillance and that we are watching her; pt will not answer the SI / HI question or any other questons

## 2015-05-28 ENCOUNTER — Encounter (HOSPITAL_COMMUNITY): Payer: Self-pay | Admitting: *Deleted

## 2015-05-28 DIAGNOSIS — Z046 Encounter for general psychiatric examination, requested by authority: Secondary | ICD-10-CM

## 2015-05-28 DIAGNOSIS — F22 Delusional disorders: Secondary | ICD-10-CM

## 2015-05-28 LAB — CBC
HEMATOCRIT: 36.8 % (ref 36.0–46.0)
HEMOGLOBIN: 12.3 g/dL (ref 12.0–15.0)
MCH: 30.6 pg (ref 26.0–34.0)
MCHC: 33.4 g/dL (ref 30.0–36.0)
MCV: 91.5 fL (ref 78.0–100.0)
Platelets: 435 10*3/uL — ABNORMAL HIGH (ref 150–400)
RBC: 4.02 MIL/uL (ref 3.87–5.11)
RDW: 13.9 % (ref 11.5–15.5)
WBC: 5.7 10*3/uL (ref 4.0–10.5)

## 2015-05-28 LAB — ACETAMINOPHEN LEVEL: Acetaminophen (Tylenol), Serum: 14 ug/mL (ref 10–30)

## 2015-05-28 LAB — COMPREHENSIVE METABOLIC PANEL
ALK PHOS: 69 U/L (ref 38–126)
ALT: 16 U/L (ref 14–54)
AST: 20 U/L (ref 15–41)
Albumin: 4.2 g/dL (ref 3.5–5.0)
Anion gap: 7 (ref 5–15)
BILIRUBIN TOTAL: 0.7 mg/dL (ref 0.3–1.2)
BUN: 22 mg/dL — ABNORMAL HIGH (ref 6–20)
CO2: 24 mmol/L (ref 22–32)
CREATININE: 0.74 mg/dL (ref 0.44–1.00)
Calcium: 9.4 mg/dL (ref 8.9–10.3)
Chloride: 106 mmol/L (ref 101–111)
GFR calc Af Amer: >60 mL/min (ref >60)
GLUCOSE: 84 mg/dL (ref 65–99)
Potassium: 3.9 mmol/L (ref 3.5–5.1)
Sodium: 137 mmol/L (ref 135–145)
TOTAL PROTEIN: 8.5 g/dL — AB (ref 6.5–8.1)

## 2015-05-28 LAB — ETHANOL

## 2015-05-28 LAB — SALICYLATE LEVEL: Salicylate Lvl: <4 mg/dL (ref 2.8–30.0)

## 2015-05-28 MED ORDER — RISPERIDONE 1 MG PO TBDP
1.0000 mg | ORAL_TABLET | Freq: Two times a day (BID) | ORAL | Status: DC
  Administered 2015-05-28 – 2015-06-02 (×11): 1 mg via ORAL
  Filled 2015-05-28 (×13): qty 1

## 2015-05-28 MED ORDER — NICOTINE 21 MG/24HR TD PT24: 21.0000 mg | MEDICATED_PATCH | Freq: Every day | TRANSDERMAL | Status: DC

## 2015-05-28 MED ORDER — HYDROXYZINE HCL 25 MG PO TABS
25.0000 mg | ORAL_TABLET | Freq: Three times a day (TID) | ORAL | Status: DC | PRN
  Administered 2015-05-30 – 2015-05-31 (×4): 25 mg via ORAL
  Filled 2015-05-28 (×5): qty 1

## 2015-05-28 MED ORDER — LORAZEPAM 2 MG/ML IJ SOLN
2.0000 mg | Freq: Once | INTRAMUSCULAR | Status: DC
  Filled 2015-05-28: qty 1

## 2015-05-28 MED ORDER — ZIPRASIDONE MESYLATE 20 MG IM SOLR
20.0000 mg | Freq: Once | INTRAMUSCULAR | Status: AC
  Administered 2015-05-28: 20 mg via INTRAMUSCULAR
  Filled 2015-05-28: qty 20

## 2015-05-28 MED ORDER — LORAZEPAM 1 MG PO TABS
1.0000 mg | ORAL_TABLET | Freq: Three times a day (TID) | ORAL | Status: DC | PRN
  Administered 2015-05-29 – 2015-05-31 (×3): 1 mg via ORAL
  Filled 2015-05-28 (×3): qty 1

## 2015-05-28 MED ORDER — DIPHENHYDRAMINE HCL 50 MG/ML IJ SOLN
50.0000 mg | Freq: Once | INTRAMUSCULAR | Status: AC
  Administered 2015-05-28: 50 mg via INTRAMUSCULAR
  Filled 2015-05-28: qty 1

## 2015-05-28 MED ORDER — ZOLPIDEM TARTRATE 5 MG PO TABS
5.0000 mg | ORAL_TABLET | Freq: Every evening | ORAL | Status: DC | PRN
  Administered 2015-05-29 – 2015-05-31 (×3): 5 mg via ORAL
  Filled 2015-05-28 (×3): qty 1

## 2015-05-28 MED ORDER — BENZTROPINE MESYLATE 1 MG PO TABS
0.5000 mg | ORAL_TABLET | Freq: Two times a day (BID) | ORAL | Status: DC
  Administered 2015-05-28 – 2015-05-30 (×5): 0.5 mg via ORAL
  Administered 2015-05-30: 1 mg via ORAL
  Administered 2015-05-31 – 2015-06-02 (×5): 0.5 mg via ORAL
  Filled 2015-05-28 (×11): qty 1

## 2015-05-28 MED ORDER — ACETAMINOPHEN 325 MG PO TABS
650.0000 mg | ORAL_TABLET | ORAL | Status: DC | PRN
  Administered 2015-05-28 – 2015-06-02 (×16): 650 mg via ORAL
  Filled 2015-05-28 (×16): qty 2

## 2015-05-28 MED ORDER — LORAZEPAM 2 MG/ML IJ SOLN
2.0000 mg | Freq: Once | INTRAMUSCULAR | Status: AC
  Administered 2015-05-28: 2 mg via INTRAMUSCULAR

## 2015-05-28 NOTE — ED Notes (Signed)
[facility] via amb[facility] with a steady gait. Patient talking loud and pushing bedside table into wall. Patient also continually slamming her room door. Patient yelling "I'm not staying here give me my stuff so  I can go"! Patient calling staff names at this times. Writer tried to explain to patient that she would have to see the doctor in the morning. Staff unable to re-direct and reason with patient at this time. Encouragement and support provided and safety maintain. Q 15 min safety checks in place.

## 2015-05-28 NOTE — ED Notes (Signed)
Wendy SievertSpencer Simon, PA informed of patient behavior and telephone orders received and read back and verified.

## 2015-05-28 NOTE — BH Assessment (Signed)
Assessment completed. Consulted Donell SievertSpencer Simon, PA-C who recommended that additional information be gathered from pt's brother and an am psych eval for final disposition. Informed Antony MaduraKelly Humes, PA-C of recommendation.
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
## 2015-05-28 NOTE — ED Notes (Signed)
Patient has been in and out of her room all day.  Irritable at times.  She keeps stating that she wants to go home.  I have explained to her that she is under an involuntary commitment and cannot leave until the doctor has cleared her.  I also explained that we are seeking placement for her at an inpatient facility.  She has agreed to stay, but states she does not want to go to a dirty place, she wants to go to a nice place.  I assured her that any place we sent her would be nice.  She was medicated once for complaints of a headache with total relief.

## 2015-05-28 NOTE — ED Notes (Signed)
PA at bedside.

## 2015-05-28 NOTE — BH Assessment (Signed)
Attempted to contact petitioner (pt's brother) to gather additional information; however he did not answer the phone and the voice mail is generic. This Clinical research associatewriter will attempt to make contact at a later time.

## 2015-05-28 NOTE — ED Provider Notes (Signed)
CSN: [id number]     Arrival date & time 05/27/15  2311 History   First MD Initiated Contact with Patient 05/28/15 0003     Chief Complaint  Patient presents with  . IVC   . Paranoid     (Consider location/radiation/quality/duration/timing/severity/associated sxs/prior Treatment) HPI Comments: Patient is a 29 year old female with a history of heart murmur, mental retardation, and cerebral aneurysm. She reports having brain surgery in March 2016. Patient presents under IVC taken out by mother. IVC papers state that patient has been mentally unstable and aggressive since her brain surgery. Per mother, patient has not been eating or sleeping. IVC papers also reference the patient has been destroying the cushions with knives and throwing canned goods. She has also broken a glass coffee table and throwing items across the house. Mother is not with the patient as she is disabled. Mother is afraid that she is in danger and that she will not be able to call for help if she needs it. Her nursing note, patient has called 911 numerous times today for false reasons. Patient expresses thoughts of paranoia, believing that her house is under surveillance. Patient reports to me that she has been having "many seizures today". She states that she felt, "the tongue going down my throat which caused me to wake up". No reports of SI/HI.  The history is provided by the patient. No language interpreter was used.    Past Medical History  Diagnosis Date  . Heart murmur   . Cerebral aneurysm   . Mental retardation    Past Surgical History  Procedure Laterality Date  . Brain surgery     No family history on file. Social History  Substance Use Topics  . Smoking status: Never Smoker   . Smokeless tobacco: None  . Alcohol Use: No   OB History    No data available      Review of Systems  Psychiatric/Behavioral: Positive for behavioral problems and agitation. Negative for suicidal ideas and self-injury.  All  other systems reviewed and are negative.   Allergies  Review of patient's allergies indicates no known allergies.  Home Medications   Prior to Admission medications   Medication Sig Start Date End Date Taking? Authorizing Provider  PRESCRIPTION MEDICATION Pain medication.    Historical Provider, MD   BP 144/102 mmHg  Pulse 89  Temp(Src) 98 F (36.7 C) (Oral)  Resp 15  SpO2 100%  LMP 05/18/2015   Physical Exam  Constitutional: She is oriented to person, place, and time. She appears well-developed and well-nourished. No distress.  Nontoxic/nonseptic appearing  HENT:  Head: Normocephalic and atraumatic.  Eyes: Conjunctivae and EOM are normal. No scleral icterus.  Neck: Normal range of motion.  Pulmonary/Chest: Effort normal. No respiratory distress.  Respirations even and unlabored  Musculoskeletal: Normal range of motion.  Neurological: She is alert and oriented to person, place, and time. She exhibits normal muscle tone. Coordination normal.  GCS 15. Speech is goal oriented. Patient moves extremities without ataxia. No focal deficits appreciated.  Skin: Skin is warm and dry. No rash noted. She is not diaphoretic. No erythema. No pallor.  Psychiatric: Her mood appears anxious. Her speech is rapid and/or pressured. She is agitated (mild).  Nursing note and vitals reviewed.   ED Course  Procedures (including critical care time) Labs Review Labs Reviewed  COMPREHENSIVE METABOLIC PANEL - Abnormal; Notable for the following:    BUN 22 (*)    Total Protein 8.5 (*)    All  other components within normal limits  CBC - Abnormal; Notable for the following:    Platelets 435 (*)    All other components within normal limits  ETHANOL  SALICYLATE LEVEL  ACETAMINOPHEN LEVEL  URINE RAPID DRUG SCREEN, HOSP PERFORMED    Imaging Review No results found.   I have personally reviewed and evaluated these images and lab results as part of my medical decision-making.   EKG  Interpretation None      MDM   Final diagnoses:  Paranoid type delusional disorder (HCC)  Involuntary commitment    Patient here under IVC. She is pending psych evaluation in the AM. Patient medically cleared. Disposition to be determined by oncoming ED provider.     Antony MaduraKelly Khalin Royce, PA-C 05/28/15 0448  Cy BlamerApril Palumbo, MD 05/28/15 [phone]

## 2015-05-28 NOTE — Consult Note (Signed)
[facility] Consult   Reason for Consult:  Delusional disorder,  Referring Physician:  EDP Patient Identification: Wendy Berry MRN:  [id number] Principal Diagnosis: Paranoid type delusional disorder [facility]) Diagnosis:   Patient Active Problem List   Diagnosis Date Noted  . Paranoid type delusional disorder ([place]) [F22] 04/28/2015    Priority: High  . Mental retardation [F79] 04/28/2015    Total Time spent with patient: 45 minutes  Subjective:   Wendy Berry is a 29 y.o. female patient admitted with Delusional disorder.  HPI:  AA female, 29 years old was evaluated for Paranoia stating that surveillance Cameras are placed all over that causes her to be huert.  Her brother  IVC and staff cannot reach him for collateral information.  Patient states that she came to the ER because she had a Seizure and she also reports a hx of brain Surgery.  She denies previous [place] hospitalization and denies seeing a Psychiatrist or taking MH medications.  She was seen here at our ER 10/25 for similar presentation and was sent home to follow up with outpatient provider.  Patient is angry, has tangential speech and is very argumentative.  Patient is well groomed with make up and perseverates  on her Aaron Edelman surgery.  CT of the head shows no previous brain surgery scar.  Patient became angry when informed we will speak to her family members.  It is documented that she has been hostile towards her disabled mother.   Patient reports poor sleep.  Patient has been accepted for admission and we will be seeking placement at any facility with available bed.    Past Psychiatric History:  Unable to obtain, patient was seen in our ER in October for similar presentation  Risk to Self: Suicidal Ideation: No Suicidal Intent: No Is patient at risk for suicide?: No Suicidal Plan?: No Access to Means: No What has been your use of drugs/alcohol within the last 12 months?: Pt denies  How many times?:  0 Other Self Harm Risks: None  Triggers for Past Attempts: None known Intentional Self Injurious Behavior: None Risk to Others: Homicidal Ideation: No Thoughts of Harm to Others: No Current Homicidal Intent: No Current Homicidal Plan: No Access to Homicidal Means: No Identified Victim: None History of harm to others?: No Assessment of Violence: None Noted Violent Behavior Description: No violent behaviors observed.  Does patient have access to weapons?: No Criminal Charges Pending?: No Does patient have a court date: No Prior Inpatient Therapy: Prior Inpatient Therapy: No Prior Therapy Dates: None  Prior Therapy Facilty/Provider(s): None  Reason for Treatment: None  Prior Outpatient Therapy: Prior Outpatient Therapy: No Prior Therapy Dates: None  Prior Therapy Facilty/Provider(s): None  Reason for Treatment: None  Does patient have an ACCT team?: No Does patient have Intensive In-House Services?  : No Does patient have [facility] services? : No Does patient have P4CC services?: No  Past Medical History:  Past Medical History  Diagnosis Date  . Heart murmur   . Cerebral aneurysm   . Mental retardation     Past Surgical History  Procedure Laterality Date  . Brain surgery     Family History: No family history on file.   Family Psychiatric  History:  Unable to obtain Social History:  History  Alcohol Use No     History  Drug Use No    Social History   Social History  . Marital Status: Single    Spouse Name: N/A  . Number of  Children: N/A  . Years of Education: N/A   Social History Main Topics  . Smoking status: Never Smoker   . Smokeless tobacco: None  . Alcohol Use: No  . Drug Use: No  . Sexual Activity: Not Asked   Other Topics Concern  . None   Social History Narrative   Additional Social History:    History of alcohol / drug use?: No history of alcohol / drug abuse                     Allergies:  No Known Allergies  Labs:  Results for  orders placed or performed during the hospital encounter of 05/27/15 (from the past 48 hour(s))  Comprehensive metabolic panel     Status: Abnormal   Collection Time: 05/27/15 11:52 PM  Result Value Ref Range   Sodium 137 135 - 145 mmol/L   Potassium 3.9 3.5 - 5.1 mmol/L   Chloride 106 101 - 111 mmol/L   CO2 24 22 - 32 mmol/L   Glucose, Bld 84 65 - 99 mg/dL   BUN 22 (H) 6 - 20 mg/dL   Creatinine, Ser 0.74 0.44 - 1.00 mg/dL   Calcium 9.4 8.9 - 10.3 mg/dL   Total Protein 8.5 (H) 6.5 - 8.1 g/dL   Albumin 4.2 3.5 - 5.0 g/dL   AST 20 15 - 41 U/L   ALT 16 14 - 54 U/L   Alkaline Phosphatase 69 38 - 126 U/L   Total Bilirubin 0.7 0.3 - 1.2 mg/dL   GFR calc non Af Amer >60 >60 mL/min   GFR calc Af Amer >60 >60 mL/min    Comment: (NOTE) The eGFR has been calculated using the CKD EPI equation. This calculation has not been validated in all clinical situations. eGFR's persistently <60 mL/min signify possible Chronic Kidney Disease.    Anion gap 7 5 - 15  Ethanol (ETOH)     Status: None   Collection Time: 05/27/15 11:52 PM  Result Value Ref Range   Alcohol, Ethyl (B) <5 <5 mg/dL    Comment:        LOWEST DETECTABLE LIMIT FOR SERUM ALCOHOL IS 5 mg/dL FOR MEDICAL PURPOSES ONLY   Salicylate level     Status: None   Collection Time: 05/27/15 11:52 PM  Result Value Ref Range   Salicylate Lvl <4.0 2.8 - 30.0 mg/dL  Acetaminophen level     Status: None   Collection Time: 05/27/15 11:52 PM  Result Value Ref Range   Acetaminophen (Tylenol), Serum 14 10 - 30 ug/mL    Comment:        THERAPEUTIC CONCENTRATIONS VARY SIGNIFICANTLY. A RANGE OF 10-30 ug/mL MAY BE AN EFFECTIVE CONCENTRATION FOR MANY PATIENTS. HOWEVER, SOME ARE BEST TREATED AT CONCENTRATIONS OUTSIDE THIS RANGE. ACETAMINOPHEN CONCENTRATIONS >150 ug/mL AT 4 HOURS AFTER INGESTION AND >50 ug/mL AT 12 HOURS AFTER INGESTION ARE OFTEN ASSOCIATED WITH TOXIC REACTIONS.   CBC     Status: Abnormal   Collection Time: 05/27/15 11:52  PM  Result Value Ref Range   WBC 5.7 4.0 - 10.5 K/uL   RBC 4.02 3.87 - 5.11 MIL/uL   Hemoglobin 12.3 12.0 - 15.0 g/dL   HCT 36.8 36.0 - 46.0 %   MCV 91.5 78.0 - 100.0 fL   MCH 30.6 26.0 - 34.0 pg   MCHC 33.4 30.0 - 36.0 g/dL   RDW 13.9 11.5 - 15.5 %   Platelets 435 (H) 150 - 400 K/uL    Current  Facility-Administered Medications  Medication Dose Route Frequency Provider Last Rate Last Dose  . acetaminophen (TYLENOL) tablet 650 mg  650 mg Oral Q4H PRN Antonietta Breach, PA-C      . benztropine (COGENTIN) tablet 0.5 mg  0.5 mg Oral BID Delfin Gant, NP      . hydrOXYzine (ATARAX/VISTARIL) tablet 25 mg  25 mg Oral TID PRN Delfin Gant, NP      . LORazepam (ATIVAN) tablet 1 mg  1 mg Oral Q8H PRN Delfin Gant, NP      . nicotine (NICODERM CQ - dosed in mg/24 hours) patch 21 mg  21 mg Transdermal Daily Antonietta Breach, PA-C      . risperiDONE (RISPERDAL M-TABS) disintegrating tablet 1 mg  1 mg Oral BID Delfin Gant, NP      . zolpidem (AMBIEN) tablet 5 mg  5 mg Oral QHS PRN Antonietta Breach, PA-C       Current Outpatient Prescriptions  Medication Sig Dispense Refill  . PRESCRIPTION MEDICATION Pain medication.      Musculoskeletal: Strength & Muscle Tone: within normal limits Gait & Station: normal Patient leans: N/A  Psychiatric Specialty Exam: Review of Systems  Unable to perform ROS: mental acuity    Blood pressure 144/102, pulse 89, temperature 98 F (36.7 C), temperature source Oral, resp. rate 15, last menstrual period 05/18/2015, SpO2 100 %.There is no height or weight on file to calculate BMI.  General Appearance: Casual and Well Groomed  Eye Contact::  Minimal  Speech:  Clear and Coherent  Volume:  Increased  Mood:  Angry, Anxious, Depressed and Irritable  Affect:  Congruent  Thought Process:  Circumstantial, Disorganized, Loose and Tangential  Orientation:  Full (Time, Place, and Person)  Thought Content:  Delusions and Paranoid Ideation  Suicidal Thoughts:   No  Homicidal Thoughts:  No  Memory:  Immediate;   Poor Recent;   Poor Remote;   Poor  Judgement:  Poor  Insight:  Lacking  Psychomotor Activity:  Normal  Concentration:  Poor  Recall:  NA  Fund of Knowledge:Poor  Language: Fair  Akathisia:  No  Handed:  Right  AIMS (if indicated):     Assets:  Desire for Improvement  ADL's:  Intact  Cognition: Impaired,  Severe  Sleep:      Treatment Plan Summary: Daily contact with patient to assess and evaluate symptoms and progress in treatment and Medication management  Disposition: Accepted for admission and we will be seeking placement at any facility with available bed.  We have started Risperdal 1 mg po bid for mood control, Cogentin 0.5 mg po bid for medication induced EPS, Hydroxyzine 25 mg tid as needed for anxiety and Ativan 1 mg po tid for agitation  Delfin Gant   PMHNP-BC 05/28/2015 10:53 AM   Patient seen and I agree with treatment plan  Levonne Spiller M.D.

## 2015-05-28 NOTE — ED Notes (Signed)
Patient pleasant and cooperative with care. Pt interacting well with staff on assessment. No s/s of distress noted. Pt requesting to take her HS medications early.

## 2015-05-29 LAB — RAPID URINE DRUG SCREEN, HOSP PERFORMED
Amphetamines: NOT DETECTED
Barbiturates: NOT DETECTED
Benzodiazepines: NOT DETECTED
COCAINE: NOT DETECTED
OPIATES: NOT DETECTED
Tetrahydrocannabinol: NOT DETECTED

## 2015-05-29 MED ORDER — TRAMADOL HCL 50 MG PO TABS
50.0000 mg | ORAL_TABLET | Freq: Once | ORAL | Status: AC
  Administered 2015-05-29: 50 mg via ORAL
  Filled 2015-05-29: qty 1

## 2015-05-29 NOTE — Progress Notes (Signed)
Pt A & O X 2. Denied SI, HI and AVH. Cooperative with care. Pt is delusional and paranoid. Presents with irritable and labile mood. Speech pressured at intervals. Pt continues to demand d/c. Pt is somatic, reports needing therapy for brain surgery which was not found in chart. Pt became agitated, loud and argumentative. Refused PRN Vistaril or Ativan when offered. Compliant with scheduled medications. Pt medicated for c/o of headache unrelieved by PRN Tylenol. EDP Dr. Jeraldine LootsLockwood was notified and new order received for Tramadol 50 mg PO X 1 for c/o headache. Refused both breakfast and lunch but ate 100% of snacks and tolerated fluids well. Pt encouraged to eat dinner. Continued support and availability offered to pt. Safety maintained on Q 15 minutes checks without gestures or incident to harm self thus far this shift.

## 2015-05-29 NOTE — Progress Notes (Signed)
[facility] been [facility] out [facility] the [facility].  CSW will follow up with facilities and continue to seek placement.   Fernande BoydenJoyce Gasper Hopes, LCSWA Clinical Social Worker [facility] Ph: [phone]

## 2015-05-29 NOTE — BH Assessment (Addendum)
Patient was reassessed by TTS.  Patient states that she is "not doing too good." Patient states that she "had surgery on my brain" and states that he is here to be monitored. Patient states that she is "supposed to be discharged today." Patient requested this writer speak with the psychiatrist regarding her follow up about her recent brain surgery.  Informed Psychiatrist who reports that there is no record of patient surgery and she spoke with her yesterday.    Patient continues to meet inpatient criteria per Dr. Tenny Crawoss and  Dahlia ByesJosephine Onuoha, NP. [facility]. TTS to seek placement.

## 2015-05-29 NOTE — ED Notes (Signed)
Pt awake, alert & responsive, no distress noted, complaint of head pain, rated as 9-10 on pain scale at present,  Denies SI, HI or AV hallucinations.  Monitoring for safety, Q 15 min checks in effect.

## 2015-05-30 NOTE — ED Notes (Signed)
Patient pleasant and cooperative with care this shift. Pt brightens on approach with staff. Pt denies SI/HI or plans to harm herself. Pt does, however continue to have the delusion of having recent brain surgery. Pt states "They cut out my aneurysm and worked on my brain". Pt with no s/s of distress noted.

## 2015-05-30 NOTE — BHH Counselor (Signed)
As requested, TTS Counselor faxed all nursing notes to [facility]. They are currently reviewing pt for potential inpatient placement.  Cyndie Mull- Gwenna Fuston, [facility]   Therapeutic Triage    05/30/15  22:06

## 2015-05-30 NOTE — Progress Notes (Signed)
CSW spoke with pt's brother, Trinidad Curetaquwan Buckels ([phone]) to ask for documentation of pt's IQ (a psychological). Though pt brought extensive medical records to hospital, CSW reviewed them and no psychological included. Flett to speak with pt's mother, Colin Broacheresa Rogers [phone]) to get psychological. [facility] if they can get documentation of her IQ.  York SpanielAlexandra Imunique Samad [facility]WA Clinical Social Worker [facility] phone: [phone]

## 2015-05-30 NOTE — ED Notes (Signed)
Patient's mother calling the unit 3 times in a 20 minute period. Staff explained that the patient was asleep and would be informed of her calls when she awakens. Pt's mother then called a 4th time shortly thereafter asking to speak to her again. Staff instructed her not to call again this evening, as the patient would call her if she wanted to speak to her.

## 2015-05-30 NOTE — ED Notes (Signed)
Patient continues to have somatic delusions of "brain surgery".. Is repeatedly asking to "go home".  Hoarding food in room and refusing all meal trays.

## 2015-05-31 NOTE — ED Notes (Signed)
Pt is becoming more agitated.  She is being verbally aggressive toward other patients in hallway and refusing to go back to her room.  She keeps reminding me that she wants to go home today and also that she will not eat hospital food.  She states that she will only eat graham crackers.  Prn given.  15 minute checks and video monitoring continue.

## 2015-05-31 NOTE — ED Notes (Signed)
Patient appears bizzare, guarded. Reports pain, headache. States that she is sad "because I feel bad". Reports hx of multiple falls r/t dizziness. Denies SI, HI, AVH.  Encouragement offered.  Q 15 safety checks continue.

## 2015-05-31 NOTE — ED Notes (Signed)
Pt has been refusing meals and eating graham crackers only.  After Encouraging patient to eat meals all day she continues to ask for graham crackers.  After setting limits patient became very angry and refused to eat meal or optional sandwich.

## 2015-05-31 NOTE — Consult Note (Signed)
  Psychiatric Specialty Exam: Physical Exam  ROS  Blood pressure 151/111, pulse 91, temperature 98.2 F (36.8 C), temperature source Oral, resp. rate 16, last menstrual period 05/18/2015, SpO2 100 %.There is no height or weight on file to calculate BMI.  General Appearance: Casual and Well Groomed  Eye Contact:: Minimal  Speech: Clear and Coherent  Volume: Increased  Mood: Angry, Anxious  Affect: Congruent  Thought Process: Circumstantial, Disorganized,  Orientation: Full (Time, Place, and Person)  Thought Content: Delusions and Paranoid Ideation  Suicidal Thoughts: No  Homicidal Thoughts: No  Memory: Immediate; Poor Recent; Poor Remote; Poor  Judgement: Poor  Insight: Lacking  Psychomotor Activity: Normal  Concentration: Poor  Recall: NA  Fund of Knowledge:Poor  Language: Fair  Akathisia: No  Handed: Right  AIMS (if indicated):    Assets: Desire for Improvement  ADL's: Intact  Cognition: Impaired, Severe          Patient is calmer today but remains delusional.  She is asking to be discharged home to seek treatment for her brain surgery.  Patient had no brain surgery and have been informed by several staff members that she had head contusion and not surgery.  Patient lacks insight and perseverates on her brain surgery.  Patient is compliant with medications.  We are looking for placement at any facility with available beds.  Patient denies SI/HI/AVH. Paranoid type delusional disorder [facility](HCC)   Plan:  Continue plan of care, seek placement and offer medications.   Dahlia ByesJosephine Onuoha   PMHNP-BC Patient seen and I agree with treatment and plan  Jamse BelfastDeborah Ross M.D.

## 2015-06-01 MED ORDER — TRAZODONE HCL 100 MG PO TABS
100.0000 mg | ORAL_TABLET | Freq: Every day | ORAL | Status: DC
  Administered 2015-06-01: 100 mg via ORAL
  Filled 2015-06-01: qty 1

## 2015-06-01 NOTE — BH Assessment (Signed)
Patient was reassessed by TTS.  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  Patient states that she is taking her medication as prescribed and is "ready to go home now, I should be going home today." patient was informed that the psychiatrist would make her request known to the psychiatrist. Informed DNP of patient requests and complaints.   Patient continues to meet criteria for inpatient treatment. TTS to seek placement.

## 2015-06-01 NOTE — Progress Notes (Signed)
CM spoke with pt who [place]cp. Pt reports "seeing a lady name Lurena JoinerRebecca [place] [address] for mental retardation" CM discussed and provided written information for uninsured accepting pcps, discussed the importance of pcp vs EDP services for f/u care, www.needymeds.org, www.goodrx.com, discounted pharmacies and other [place] resources such as [facility] , [facility], affordable care act, financial assistance, uninsured dental services, [place] med assist, DSS and  health department  Reviewed resources for [place] uninsured accepting pcps like [facility], family medicine at [address], [facility], [facility], local urgent care centers, [facility], [facility] family practice, general medical clinics, [facility], [facility] urgent care plus others, medication resources, [facility] out patient pharmacies and housing Pt voiced understanding and appreciation of resources provided   Provided P4CC contact information Pt stated " I don't need to an orange card

## 2015-06-01 NOTE — ED Notes (Signed)
Patient is irritable. She is in room awake on stretcher. She questioned what medications she will receive tonight. Patient educated on medications. She does not want to talk at this time. Patient left alone and I introduced myself and informed patient to call me with questions. Patient with Q 15 minute checks in progress and maintained for safety. Monitoring continues.

## 2015-06-01 NOTE — ED Notes (Signed)
Milana Huntsmanamecia has been on the phone multiple times today, often speaking loudly and arguing with someone on the other end of the line.  The rules are that patients are allowed 3 phone calls per day and she has exceeded that.  She has been informed she may not use the phone again today.  She then went into her room and there was a lot of noise as if someone was throwing things around.  When confronted she denied that she had done anything.  Her mother has also called multiple times today demanding that patient be released and that she will be coming up here to get her.  I did speak to her as there is a signed release in her chart.  I informed the mother that we are still in the process of looking for a bed for her.  She asked what kind of bed and I told her a psych bed.  She stated that her daughter does not belong in a psych hospital and we better be finding her a bed somewhere to get her brain fixed.  States since her "brain surgery" in March of this year she has had terrible headaches and she needed to come to the hospital to get rid of them.  Also stated that we better not be placing her in any psych bed and [place].  I explained to her that we do not always have control over where a person is going to go when they leave here.  She then asked to speak with the doctor or to have the doctor call her today, she wants her daughter evaluated again and discharged as soon as possible.

## 2015-06-01 NOTE — ED Notes (Signed)
Patient continues to be intrusive, demanding and argumentative.  She is very difficult to re-direct.  Continues with delusions of having had brain surgery in March.

## 2015-06-01 NOTE — BH Assessment (Signed)
Received call from [facility] saying Pt is declined due to aggressive behavior.  Harlin RainFord Ellis Patsy BaltimoreWarrick Jr, LPC, [facility], [facility] Triage Specialist [phone]

## 2015-06-02 MED ORDER — TRAZODONE HCL 100 MG PO TABS: 100.0000 mg | ORAL_TABLET | Freq: Every day | ORAL | Status: DC

## 2015-06-02 MED ORDER — BENZTROPINE MESYLATE 0.5 MG PO TABS: 0.5000 mg | ORAL_TABLET | Freq: Two times a day (BID) | ORAL | Status: DC

## 2015-06-02 MED ORDER — RISPERIDONE 1 MG PO TBDP: 1.0000 mg | ORAL_TABLET | Freq: Two times a day (BID) | ORAL | Status: DC

## 2015-06-02 NOTE — Consult Note (Signed)
Psychiatric Specialty Exam: Physical Exam  ROS  Blood pressure 125/68, pulse 76, temperature 98 F (36.7 C), temperature source Oral, resp. rate 16, last menstrual period 05/18/2015, SpO2 97 %.There is no height or weight on file to calculate BMI.  General Appearance: Casual and Fairly Groomed  Eye Contact::  Good  Speech:  Clear and Coherent and Normal Rate  Volume:  Normal  Mood:  Euthymic  Affect:  Congruent  Thought Process:  Coherent, Goal Directed and delusioal, still believes she had brain surgery in March.  Orientation:  Full (Time, Place, and Person)  Thought Content:  Delusions  Suicidal Thoughts:  No  Homicidal Thoughts:  No  Memory:  Immediate;   Fair Recent;   Fair Remote;   Fair  Judgement:  Fair  Insight:  Shallow  Psychomotor Activity:  Normal  Concentration:  Fair  Recall:  NA  Fund of Knowledge:  Fair  Language:  Good  Akathisia:  NA  Handed:  Right  AIMS (if indicated):     Assets:  Desire for Improvement  ADL's:  Intact  Cognition:  Impaired,  Moderate  Sleep:      Patient is calm and cooperative today.  Patient states that she is ready to go home and pursue her claims.  Patient reports occasional to frequent headache after brain surgery.  Patient denies SI/HI/AVH.  Patient is discharged home.  Paranoid type delusional disorder (HCC)   Plan:   Discharge home.  Wendy Berry   PMHNP-BC Patient seen face-to-face for psychiatric evaluation, chart reviewed and case discussed with the physician extender and developed treatment plan. Reviewed the information documented and agree with the treatment plan. Thedore MinsMojeed Quaron Delacruz, MD

## 2015-06-02 NOTE — BHH Counselor (Signed)
TTS Counselor faxed referrals to the following additional facilities in effort to obtain inpt placement:  [facility]

## 2015-06-02 NOTE — ED Notes (Signed)
Patient continues to talk about her "brain surgery."  She has administered tylenol for a continuing pain level of 7-10.  Patient's mother has also called this morning requesting patient be discharged "because she has an appointment."  She denies SI/HI/AVH.  She has been calm and cooperative.  She has been interacting well staff.

## 2015-06-02 NOTE — BH Assessment (Signed)
BHH Assessment Progress Note  Per Thedore MinsMojeed Akintayo, MD, this pt does not require psychiatric hospitalization at this time.  She presents under IVC initiated by her brother.  Pt is to be released from petition and [facility].  IVC has been rescinded.  Discharge instructions [facility].  Pt's nurse, Rayfield CitizenCaroline, has been notified.  Doylene Canninghomas Jinan Biggins, MA Triage Specialist [phone]

## 2015-06-02 NOTE — Discharge Instructions (Signed)
For your ongoing behavioral health needs you are advised to follow up with [facility].  New patients are seen at their walk-in clinic.  Walk-in hours are Monday - Friday from 8:00 am - 12:00 pm, and from 1:00 pm - 3:00 pm.  Walk-in patients are seen on a first come, first served basis, so try to arrive as early as possible for the best chance of being seen the same day.  There is an initial fee of $22.50: ° °     [facility] °     [address] °     [place], [place] [address] °     [phone] °

## 2015-06-02 NOTE — [facility] Suicide Risk Assessment (Signed)
Suicide Risk Assessment  Discharge Assessment   [facility]   Demographic Factors:  Low socioeconomic status and Unemployed  Total Time spent with patient: 20 minutes  Musculoskeletal: Strength & Muscle Tone: within normal limits Gait & Station: normal Patient leans: N/A  Psychiatric Specialty Exam:     Blood pressure 125/68, pulse 76, temperature 98 F (36.7 C), temperature source Oral, resp. rate 16, last menstrual period 05/18/2015, SpO2 97 %.There is no height or weight on file to calculate BMI.  General Appearance: Casual and Fairly Groomed  Eye Contact:: Good  Speech: Clear and Coherent and Normal Rate  Volume: Normal  Mood: Euthymic  Affect: Congruent  Thought Process: Coherent, Goal Directed and delusioal, still believes she had brain surgery in March.  Orientation: Full (Time, Place, and Person)  Thought Content: Delusions  Suicidal Thoughts: No  Homicidal Thoughts: No  Memory: Immediate; Fair Recent; Fair Remote; Fair  Judgement: Fair  Insight: Shallow  Psychomotor Activity: Normal  Concentration: Fair  Recall: NA  Fund of Knowledge: Fair  Language: Good  Akathisia: NA  Handed: Right  AIMS (if indicated):    Assets: Desire for Improvement  ADL's: Intact  Cognition: Impaired, Moderate  Sleep:              Has this patient used any form of tobacco in the last 30 days? (Cigarettes, Smokeless Tobacco, Cigars, and/or Pipes) N/A  Mental Status Per Nursing Assessment::   On Admission:     Current Mental Status by Physician: NA  Loss Factors: NA  Historical Factors: NA  Risk Reduction Factors:   Living with another person, especially a relative  Continued Clinical Symptoms:  Currently Psychotic  Cognitive Features That Contribute To Risk:  Polarized thinking    Suicide Risk:  Minimal: No identifiable suicidal ideation.  Patients presenting with no risk  factors but with morbid ruminations; may be classified as minimal risk based on the severity of the depressive symptoms  Principal Problem: Paranoid type delusional disorder [facility](HCC) Discharge Diagnoses:  Patient Active Problem List   Diagnosis Date Noted  . 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 Involuntary commitment [Z04.6]   . Mental retardation [F79] 04/28/2015    Follow-up Information    Follow up with Please use the resources provided to you in emergency room by case manager to assist with doctor for follow up .   Contact information:   These [place] uninsured resources provide possible primary care providers, resources for discounted medications, housing, dental resources, affordable care act information, plus other resources [place]        Plan Of Care/Follow-up recommendations:  Activity:  As tolerated Diet:  regular  Is patient on multiple antipsychotic therapies at discharge:  No   Has Patient had three or more failed trials of antipsychotic monotherapy by history:  No  Recommended Plan for Multiple Antipsychotic Therapies: NA    Earney NavyONUOHA, JOSEPHINE C   PMHNP-BC 06/02/2015, 10:54 AM   Patient seen and I agree with treatment plan  Diannia Rudereborah Ross M.D.

## 2015-06-02 NOTE — ED Notes (Signed)
Patient discharged home today per MD order.  Patient called her family to pick her up.  She received a copy of her discharge instructions and prescriptions.  She denies SI/HI/AVH.  Patient left without incident to the care of her family.

## 2015-06-06 ENCOUNTER — Encounter (HOSPITAL_COMMUNITY): Payer: Self-pay | Admitting: Emergency Medicine

## 2015-06-06 ENCOUNTER — Emergency Department (HOSPITAL_COMMUNITY)
Admission: EM | Admit: 2015-06-06 | Discharge: 2015-06-06 | Disposition: A | Discharge status: Home / Self Care | Payer: Medicaid Other | Attending: Emergency Medicine | Admitting: Emergency Medicine

## 2015-06-06 DIAGNOSIS — F419 Anxiety disorder, unspecified: Secondary | ICD-10-CM | POA: Diagnosis not present

## 2015-06-06 DIAGNOSIS — F99 Mental disorder, not otherwise specified: Secondary | ICD-10-CM | POA: Insufficient documentation

## 2015-06-06 DIAGNOSIS — F79 Unspecified intellectual disabilities: Secondary | ICD-10-CM | POA: Insufficient documentation

## 2015-06-06 DIAGNOSIS — R011 Cardiac murmur, unspecified: Secondary | ICD-10-CM | POA: Diagnosis not present

## 2015-06-06 DIAGNOSIS — R002 Palpitations: Secondary | ICD-10-CM | POA: Diagnosis present

## 2015-06-06 MED ORDER — BENZTROPINE MESYLATE 0.5 MG PO TABS: 0.5000 mg | ORAL_TABLET | Freq: Two times a day (BID) | ORAL | Status: DC

## 2015-06-06 MED ORDER — TRAZODONE HCL 100 MG PO TABS: 100.0000 mg | ORAL_TABLET | Freq: Every day | ORAL | Status: DC

## 2015-06-06 MED ORDER — RISPERIDONE 1 MG PO TBDP: 1.0000 mg | ORAL_TABLET | Freq: Two times a day (BID) | ORAL | Status: DC

## 2015-06-06 NOTE — [facility] Counselor (Signed)
EDP Freida BusmanAllen requests info for pt re: possible financial help with scripts. CSW not here today. Writer left voicemail for weekend case manager RN Melvia Heapslicia Chavis [phone] requesting Jyl HeinzChavis provide info similar to info provided by Edd ArbourKimberly Gibbs case manager last week. EDP Freida BusmanAllen reports he will provide Rxs to pt and then pt to be d/c. Writer attempted to call pt's mom Colin Broacheresa Rogers at [phone], however person who answered stated that number didn't belong to Colin Broacheresa Rogers.  Evette Cristalaroline Paige Deshea Pooley, [place] Therapeutic Triage Specialist

## 2015-06-06 NOTE — Discharge Instructions (Signed)
Followup with your doctor as needed

## 2015-06-06 NOTE — ED Notes (Signed)
Per pt, states her heart has been hurting on and off since she was young-states both ankle have been giving out-called police to take her to ED-police state she thinks police are monitoring her at her home

## 2015-06-06 NOTE — Care Management Note (Signed)
Case Management Note  Patient Details  Name: Wendy Berry Schwandt MRN: [id number] Date of Birth: 1985/10/20  Subjective/Objective:    headaches                Action/Plan: NCM attempted to contact pt via phone to follow up to see if she received medications from her pharmacy. Attempted call listed to number in chart. Wrong number.   Expected Discharge Date:  06/06/2015              Expected Discharge Plan:  Home/Self Care  In-House Referral:     Discharge planning Services  CM Consult, Medication Assistance, Indigent Health Clinic   Status of Service:  Completed, signed off  Medicare Important Message Given:    Date Medicare IM Given:    Medicare IM give by:    Date Additional Medicare IM Given:    Additional Medicare Important Message give by:     If discussed at Long Length of Stay Meetings, dates discussed:    Additional Comments:  Elliot CousinShavis, Deidrea Gaetz Ellen, RN 06/06/2015, 5:18 PM

## 2015-06-06 NOTE — ED Provider Notes (Signed)
CSN: [id number]     Arrival date & time 06/06/15  1334 History   First MD Initiated Contact with Patient 06/06/15 1528     Chief Complaint  Patient presents with  . Chest Pain/medical clearance     (Consider location/radiation/quality/duration/timing/severity/associated sxs/prior Treatment) HPI Comments: Pt here with increased anxiety and papitations due to not taking her respidal and depakote Denies hi/si D/c from [facility] 2 days ago and according to mother didn't get rx She denies any drug usage Has had heart papitations since she was young and this is no different No auiditory or visual hallucinations No tx used pta  The history is provided by the patient.    Past Medical History  Diagnosis Date  . Heart murmur   . Cerebral aneurysm   . Mental retardation    Past Surgical History  Procedure Laterality Date  . Brain surgery     No family history on file. Social History  Substance Use Topics  . Smoking status: Never Smoker   . Smokeless tobacco: None  . Alcohol Use: No   OB History    No data available     Review of Systems  All other systems reviewed and are negative.     Allergies  Review of patient's allergies indicates no known allergies.  Home Medications   Prior to Admission medications   Medication Sig Start Date End Date Taking? Authorizing Provider  hydrOXYzine (VISTARIL) 25 MG capsule Take 25 mg by mouth 3 (three) times daily as needed for anxiety.   Yes Historical Provider, MD  benztropine (COGENTIN) 0.5 MG tablet Take 1 tablet (0.5 mg total) by mouth 2 (two) times daily. Patient not taking: Reported on 06/06/2015 06/02/15   Earney Navy, NP  risperiDONE (RISPERDAL M-TABS) 1 MG disintegrating tablet Take 1 tablet (1 mg total) by mouth 2 (two) times daily. Patient not taking: Reported on 06/06/2015 06/02/15   Earney Navy, NP  traZODone (DESYREL) 100 MG tablet Take 1 tablet (100 mg total) by mouth at bedtime. Patient not taking: Reported on  06/06/2015 06/02/15   Earney Navy, NP   BP 148/95 mmHg  Pulse 86  Temp(Src) 98 F (36.7 C) (Oral)  Resp 17  SpO2 100%  LMP 05/18/2015 Physical Exam  Constitutional: She is oriented to person, place, and time. She appears well-developed and well-nourished.  Non-toxic appearance. No distress.  HENT:  Head: Normocephalic and atraumatic.  Eyes: Conjunctivae, EOM and lids are normal. Pupils are equal, round, and reactive to light.  Neck: Normal range of motion. Neck supple. No tracheal deviation present. No thyroid mass present.  Cardiovascular: Normal rate, regular rhythm and normal heart sounds.  Exam reveals no gallop.   No murmur heard. Pulmonary/Chest: Effort normal and breath sounds normal. No stridor. No respiratory distress. She has no decreased breath sounds. She has no wheezes. She has no rhonchi. She has no rales.  Abdominal: Soft. Normal appearance and bowel sounds are normal. She exhibits no distension. There is no tenderness. There is no rebound and no CVA tenderness.  Musculoskeletal: Normal range of motion. She exhibits no edema or tenderness.  Neurological: She is alert and oriented to person, place, and time. She has normal strength. No cranial nerve deficit or sensory deficit. GCS eye subscore is 4. GCS verbal subscore is 5. GCS motor subscore is 6.  Skin: Skin is warm and dry. No abrasion and no rash noted.  Psychiatric: Her speech is normal and behavior is normal. Her mood appears anxious.  Nursing note and vitals reviewed.   ED Course  Procedures (including critical care time) Labs Review Labs Reviewed - No data to display  Imaging Review No results found. I have personally reviewed and evaluated these images and lab results as part of my medical decision-making.   EKG Interpretation   Date/Time:  Saturday June 06 2015 13:50:42 EST Ventricular Rate:  74 PR Interval:  170 QRS Duration: 75 QT Interval:  370 QTC Calculation: 410 R Axis:   68 Text  Interpretation:  Sinus rhythm Baseline wander in lead(s) II III aVF  No significant change since last tracing Confirmed by Xaiver Roskelley  MD, Janavia Rottman  ([id number]) on 06/06/2015 3:38:00 PM      MDM   Final diagnoses:  None    Patient without acute psychiatric emergency will refill her medications and discharge    Lorre NickAnthony Taiyo Kozma, MD 06/06/15 1647

## 2015-06-28 ENCOUNTER — Emergency Department (HOSPITAL_COMMUNITY)
Admission: EM | Admit: 2015-06-28 | Discharge: 2015-06-28 | Disposition: A | Discharge status: Home / Self Care | Payer: Medicaid Other | Attending: Emergency Medicine | Admitting: Emergency Medicine

## 2015-06-28 ENCOUNTER — Encounter (HOSPITAL_COMMUNITY): Payer: Self-pay | Admitting: Vascular Surgery

## 2015-06-28 ENCOUNTER — Other Ambulatory Visit: Payer: Self-pay

## 2015-06-28 DIAGNOSIS — R2240 Localized swelling, mass and lump, unspecified lower limb: Secondary | ICD-10-CM | POA: Insufficient documentation

## 2015-06-28 DIAGNOSIS — F419 Anxiety disorder, unspecified: Secondary | ICD-10-CM | POA: Diagnosis not present

## 2015-06-28 DIAGNOSIS — Z8679 Personal history of other diseases of the circulatory system: Secondary | ICD-10-CM | POA: Insufficient documentation

## 2015-06-28 DIAGNOSIS — Z79899 Other long term (current) drug therapy: Secondary | ICD-10-CM | POA: Diagnosis not present

## 2015-06-28 DIAGNOSIS — F22 Delusional disorders: Secondary | ICD-10-CM | POA: Diagnosis not present

## 2015-06-28 DIAGNOSIS — R011 Cardiac murmur, unspecified: Secondary | ICD-10-CM | POA: Insufficient documentation

## 2015-06-28 DIAGNOSIS — R079 Chest pain, unspecified: Secondary | ICD-10-CM | POA: Diagnosis present

## 2015-06-28 LAB — I-STAT TROPONIN, ED: Troponin i, poc: 0.06 ng/mL (ref 0.00–0.08)

## 2015-06-28 LAB — BASIC METABOLIC PANEL
Anion gap: 7 (ref 5–15)
BUN: 25 mg/dL — AB (ref 6–20)
CHLORIDE: 106 mmol/L (ref 101–111)
CO2: 25 mmol/L (ref 22–32)
CREATININE: 0.84 mg/dL (ref 0.44–1.00)
Calcium: 9.5 mg/dL (ref 8.9–10.3)
GFR calc Af Amer: >60 mL/min (ref >60)
GLUCOSE: 77 mg/dL (ref 65–99)
Potassium: 3.8 mmol/L (ref 3.5–5.1)
Sodium: 138 mmol/L (ref 135–145)

## 2015-06-28 LAB — CBC
HCT: 38.6 % (ref 36.0–46.0)
HEMOGLOBIN: 12.6 g/dL (ref 12.0–15.0)
MCH: 30.4 pg (ref 26.0–34.0)
MCHC: 32.6 g/dL (ref 30.0–36.0)
MCV: 93 fL (ref 78.0–100.0)
Platelets: 380 10*3/uL (ref 150–400)
RBC: 4.15 MIL/uL (ref 3.87–5.11)
RDW: 13.3 % (ref 11.5–15.5)
WBC: 6.6 10*3/uL (ref 4.0–10.5)

## 2015-06-28 LAB — ETHANOL: Alcohol, Ethyl (B): <5 mg/dL (ref <5)

## 2015-06-28 MED ORDER — HYDROXYZINE HCL 25 MG PO TABS: 25.0000 mg | ORAL_TABLET | Freq: Three times a day (TID) | ORAL | Status: DC | PRN

## 2015-06-28 MED ORDER — TRAZODONE HCL 100 MG PO TABS: 100.0000 mg | ORAL_TABLET | Freq: Every day | ORAL | Status: DC

## 2015-06-28 MED ORDER — HYDROXYZINE PAMOATE 25 MG PO CAPS
25.0000 mg | ORAL_CAPSULE | Freq: Three times a day (TID) | ORAL | Status: DC | PRN
  Filled 2015-06-28: qty 1

## 2015-06-28 MED ORDER — BENZTROPINE MESYLATE 1 MG PO TABS: 0.5000 mg | ORAL_TABLET | Freq: Two times a day (BID) | ORAL | Status: DC

## 2015-06-28 MED ORDER — ACETAMINOPHEN 325 MG PO TABS
325.0000 mg | ORAL_TABLET | Freq: Once | ORAL | Status: AC
  Administered 2015-06-28: 325 mg via ORAL
  Filled 2015-06-28: qty 1

## 2015-06-28 MED ORDER — RISPERIDONE 1 MG PO TBDP
1.0000 mg | ORAL_TABLET | Freq: Two times a day (BID) | ORAL | Status: DC
  Filled 2015-06-28: qty 1

## 2015-06-28 NOTE — ED Notes (Signed)
Pt wanded by security and escorted to [facility]

## 2015-06-28 NOTE — ED Notes (Signed)
Pt called someone from nurses' desk and requested for them to pick her up.

## 2015-06-28 NOTE — ED Notes (Signed)
Pt arrived to [facility] - ambulatory w/RN - belongings placed at nurses' desk for inventory. Pt requesting to leave d/t not being allowed to bra. Dr Ethelda ChickJacubowitz and this RN spoke w/pt - encouraged to stay if feels she needs to stay. Pt states she lives alone and feels safe. Denies SI/HI.

## 2015-06-28 NOTE — ED Provider Notes (Signed)
CSN: [id number]     Arrival date & time 06/28/15  1130 History   First MD Initiated Contact with Patient 06/28/15 1243     Chief Complaint  Patient presents with  . Chest Pain  . Anxiety   HPI   29 year old female presents today with chest pain and paranoid delusions. Patient reports that she has a Wellsite geologist video in her home that causes me to get beat up". She notes that the surveyor video "is sick and pervert and and wants me to get hurt". She reports chest pain today, unable to ascertain when chest pain started this patient has no linear thought process and continually refers back to her "tumors in the brain" and brain surgeries. When asked if she is taking her medication patient is unable to give a clear answer reporting that she she does take it, but one only in the hospital as she cannot afford it at home. When asked where she is living she is unable to tell me where she lives other than the place she stays as the surveyor video's.   Past Medical History  Diagnosis Date  . Heart murmur   . Cerebral aneurysm   . Mental retardation    Past Surgical History  Procedure Laterality Date  . Brain surgery     No family history on file. Social History  Substance Use Topics  . Smoking status: Never Smoker   . Smokeless tobacco: None  . Alcohol Use: No   OB History    No data available     Review of Systems  All other systems reviewed and are negative.   Allergies  Review of patient's allergies indicates no known allergies.  Home Medications   Prior to Admission medications   Medication Sig Start Date End Date Taking? Authorizing Provider  benztropine (COGENTIN) 0.5 MG tablet Take 1 tablet (0.5 mg total) by mouth 2 (two) times daily. 06/06/15   Lorre Nick, MD  hydrOXYzine (VISTARIL) 25 MG capsule Take 25 mg by mouth 3 (three) times daily as needed for anxiety.    Historical Provider, MD  risperiDONE (RISPERDAL M-TABS) 1 MG disintegrating tablet Take 1 tablet (1 mg total)  by mouth 2 (two) times daily. 06/06/15   Lorre Nick, MD  traZODone (DESYREL) 100 MG tablet Take 1 tablet (100 mg total) by mouth at bedtime. 06/06/15   Lorre Nick, MD   BP 128/87 mmHg  Pulse 99  Temp(Src) 97.4 F (36.3 C) (Oral)  Resp 17  SpO2 93%   Physical Exam  Constitutional: She is oriented to person, place, and time. She appears well-developed and well-nourished. No distress.  HENT:  Head: Normocephalic and atraumatic.  Eyes: Conjunctivae are normal. Pupils are equal, round, and reactive to light. Right eye exhibits no discharge. Left eye exhibits no discharge. No scleral icterus.  Neck: Normal range of motion. No JVD present. No tracheal deviation present.  Cardiovascular: Normal rate, regular rhythm, normal heart sounds and intact distal pulses.  Exam reveals no gallop and no friction rub.   No murmur heard. Pulmonary/Chest: Effort normal and breath sounds normal. No stridor. No respiratory distress. She has no wheezes. She has no rales. She exhibits no tenderness.  Abdominal: Soft. She exhibits no distension.  Musculoskeletal: Normal range of motion. She exhibits no edema or tenderness.  No lower extremity swelling or edema  Neurological: She is alert and oriented to person, place, and time. Coordination normal.  Skin: Skin is warm and dry. No erythema.  Psychiatric: She has a  normal mood and affect. Her behavior is normal.  Nursing note and vitals reviewed.     ED Course  Procedures (including critical care time) Labs Review Labs Reviewed  BASIC METABOLIC PANEL - Abnormal; Notable for the following:    BUN 25 (*)    All other components within normal limits  CBC  I-STAT TROPOININ, ED    Imaging Review No results found. I have personally reviewed and evaluated these images and lab results as part of my medical decision-making.   EKG Interpretation None      MDM   Final diagnoses:  Delusions (HCC)    Labs: BMP, i-STAT troponin, CBC- no significant  findings  Imaging: ED EKG  Consults: TTS  Therapeutics: Tylenol  Discharge Meds:   Assessment/Plan: Patient presents with reports of chest pain and paranoid delusions. Previous history of similar presentations, no significant findings today that would necessitate further evaluation and management here in the ED setting. TTS consult at 2 advice for overnight observation with psych eval in the morning. I spoke with the patient who agreed to stay overnight for evaluation tomorrow.         Eyvonne MechanicJeffrey Kenry Daubert, PA-C 06/28/15 1636  Doug SouSam Jacubowitz, MD 06/28/15 [phone]

## 2015-06-28 NOTE — Discharge Instructions (Signed)
Return to the emergency department and call 911 if you feel worse for any reason or have any thoughts of harming himself or someone else.

## 2015-06-28 NOTE — ED Notes (Signed)
TTS speaking with pt at this time. 

## 2015-06-28 NOTE — ED Notes (Signed)
Pt reports to the ED for eval of chest tightness and bilateral arm numbness. Pt reports it began today after arguing with her brother. Pt has hx of anxiety attacks. Pt reports she has been unable to afford her medications. Per EMS she reports she needs to get away from her family for a while. Pt A&OX4, resp e/u, and skin warm and dry.

## 2015-06-28 NOTE — ED Notes (Signed)
Pt placed in paper scrubs, [place]

## 2015-06-28 NOTE — ED Provider Notes (Addendum)
Patient reports "my heart hurts "every day for the past several years, worse with standing and improved with sitting down and improved with walking. She also reports "gas in my ankles and both hands numb. Patient reports that she is seeing a psychiatrist as recently as one week ago. Patient is delusional. She vehemently denies wanting to harm herself or others. On exam alert lungs clear auscultation heart regular rate and rhythm no murmurs abdomen nondistended extremities without edema neurologic Glasgow Coma Score 15 gait normal cranial nerves II through XII grossly intact Patient is delusional. She does not wish to stay for observation, and evaluation by psychiatry. She vehemently denies she's harm herself or others. She appears well kempt and able to care for herself. She took the city bus here. She is advised to return to the emergency department if she feels worse for any reason. Or if she has any thought of harming herself or others. Results for orders placed or performed during the hospital encounter of 06/28/15  Basic metabolic panel  Result Value Ref Range   Sodium 138 135 - 145 mmol/L   Potassium 3.8 3.5 - 5.1 mmol/L   Chloride 106 101 - 111 mmol/L   CO2 25 22 - 32 mmol/L   Glucose, Bld 77 65 - 99 mg/dL   BUN 25 (H) 6 - 20 mg/dL   Creatinine, Ser 1.610.84 0.44 - 1.00 mg/dL   Calcium 9.5 8.9 - 09.610.3 mg/dL   GFR calc non Af Amer >60 >60 mL/min   GFR calc Af Amer >60 >60 mL/min   Anion gap 7 5 - 15  CBC  Result Value Ref Range   WBC 6.6 4.0 - 10.5 K/uL   RBC 4.15 3.87 - 5.11 MIL/uL   Hemoglobin 12.6 12.0 - 15.0 g/dL   HCT 04.538.6 40.936.0 - 81.146.0 %   MCV 93.0 78.0 - 100.0 fL   MCH 30.4 26.0 - 34.0 pg   MCHC 32.6 30.0 - 36.0 g/dL   RDW 91.413.3 78.211.5 - 95.615.5 %   Platelets 380 150 - 400 K/uL  I-stat troponin, ED ([facility])  Result Value Ref Range   Troponin i, poc 0.06 0.00 - 0.08 ng/mL   Comment 3           No results found.  ED ECG REPORT   Date: 06/28/2015  Rate: 95  Rhythm: normal  sinus rhythm  QRS Axis: normal  Intervals: normal  ST/T Wave abnormalities: nonspecific T wave changes  Conduction Disutrbances:none  Narrative Interpretation:   Old EKG Reviewed: unChanged from 06/06/2015 interpreted by me  I have personally reviewed the EKG tracing and agree with the computerized printout as noted. Doug SouSam Debroah Shuttleworth, MD 06/28/15 1704  Doug SouSam Bern Fare, MD 06/28/15 [phone]
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
## 2015-07-16 ENCOUNTER — Encounter (HOSPITAL_COMMUNITY): Payer: Self-pay

## 2015-07-16 ENCOUNTER — Emergency Department (HOSPITAL_COMMUNITY)
Admission: EM | Admit: 2015-07-16 | Discharge: 2015-07-17 | Disposition: A | Discharge status: Home / Self Care | Payer: Medicaid Other | Attending: Emergency Medicine | Admitting: Emergency Medicine

## 2015-07-16 DIAGNOSIS — Z046 Encounter for general psychiatric examination, requested by authority: Secondary | ICD-10-CM

## 2015-07-16 DIAGNOSIS — R011 Cardiac murmur, unspecified: Secondary | ICD-10-CM | POA: Insufficient documentation

## 2015-07-16 DIAGNOSIS — Z8679 Personal history of other diseases of the circulatory system: Secondary | ICD-10-CM | POA: Insufficient documentation

## 2015-07-16 DIAGNOSIS — Z79899 Other long term (current) drug therapy: Secondary | ICD-10-CM | POA: Diagnosis not present

## 2015-07-16 DIAGNOSIS — F22 Delusional disorders: Secondary | ICD-10-CM | POA: Diagnosis not present

## 2015-07-16 DIAGNOSIS — F79 Unspecified intellectual disabilities: Secondary | ICD-10-CM | POA: Diagnosis not present

## 2015-07-16 DIAGNOSIS — Z3202 Encounter for pregnancy test, result negative: Secondary | ICD-10-CM | POA: Diagnosis not present

## 2015-07-16 LAB — COMPREHENSIVE METABOLIC PANEL
ALBUMIN: 3.8 g/dL (ref 3.5–5.0)
ALK PHOS: 47 U/L (ref 38–126)
ALT: 20 U/L (ref 14–54)
ANION GAP: 9 (ref 5–15)
AST: 20 U/L (ref 15–41)
BUN: 19 mg/dL (ref 6–20)
CALCIUM: 9.2 mg/dL (ref 8.9–10.3)
CHLORIDE: 107 mmol/L (ref 101–111)
CO2: 23 mmol/L (ref 22–32)
CREATININE: 0.8 mg/dL (ref 0.44–1.00)
GFR calc non Af Amer: >60 mL/min (ref >60)
GLUCOSE: 88 mg/dL (ref 65–99)
Potassium: 4 mmol/L (ref 3.5–5.1)
SODIUM: 139 mmol/L (ref 135–145)
Total Bilirubin: 0.8 mg/dL (ref 0.3–1.2)
Total Protein: 7.9 g/dL (ref 6.5–8.1)

## 2015-07-16 LAB — CBC WITH DIFFERENTIAL/PLATELET
Basophils Absolute: 0 10*3/uL (ref 0.0–0.1)
Basophils Relative: 0 %
Eosinophils Absolute: 0.1 10*3/uL (ref 0.0–0.7)
Eosinophils Relative: 1 %
HCT: 34.9 % — ABNORMAL LOW (ref 36.0–46.0)
HEMOGLOBIN: 11.2 g/dL — AB (ref 12.0–15.0)
LYMPHS ABS: 2.7 10*3/uL (ref 0.7–4.0)
LYMPHS PCT: 41 %
MCH: 29.8 pg (ref 26.0–34.0)
MCHC: 32.1 g/dL (ref 30.0–36.0)
MCV: 92.8 fL (ref 78.0–100.0)
MONOS PCT: 14 %
Monocytes Absolute: 0.9 10*3/uL (ref 0.1–1.0)
NEUTROS PCT: 44 %
Neutro Abs: 2.9 10*3/uL (ref 1.7–7.7)
Platelets: 402 10*3/uL — ABNORMAL HIGH (ref 150–400)
RBC: 3.76 MIL/uL — AB (ref 3.87–5.11)
RDW: 13.4 % (ref 11.5–15.5)
WBC: 6.5 10*3/uL (ref 4.0–10.5)

## 2015-07-16 LAB — RAPID URINE DRUG SCREEN, HOSP PERFORMED
AMPHETAMINES: NOT DETECTED
BARBITURATES: NOT DETECTED
Benzodiazepines: NOT DETECTED
Cocaine: NOT DETECTED
Opiates: NOT DETECTED
TETRAHYDROCANNABINOL: NOT DETECTED

## 2015-07-16 LAB — URINALYSIS, ROUTINE W REFLEX MICROSCOPIC
BILIRUBIN URINE: NEGATIVE
Glucose, UA: NEGATIVE mg/dL
Hgb urine dipstick: NEGATIVE
KETONES UR: NEGATIVE mg/dL
LEUKOCYTES UA: NEGATIVE
NITRITE: NEGATIVE
PROTEIN: NEGATIVE mg/dL
Specific Gravity, Urine: >1.046 — ABNORMAL HIGH (ref 1.005–1.030)
pH: 5.5 (ref 5.0–8.0)

## 2015-07-16 LAB — POC URINE PREG, ED: PREG TEST UR: NEGATIVE

## 2015-07-16 LAB — ETHANOL: Alcohol, Ethyl (B): <5 mg/dL (ref <5)

## 2015-07-16 MED ORDER — LORAZEPAM 1 MG PO TABS: 1.0000 mg | ORAL_TABLET | Freq: Three times a day (TID) | ORAL | Status: DC | PRN

## 2015-07-16 MED ORDER — IBUPROFEN 200 MG PO TABS
600.0000 mg | ORAL_TABLET | Freq: Three times a day (TID) | ORAL | Status: DC | PRN
  Administered 2015-07-16 – 2015-07-17 (×2): 600 mg via ORAL
  Filled 2015-07-16 (×3): qty 3

## 2015-07-16 MED ORDER — BENZTROPINE MESYLATE 1 MG PO TABS
0.5000 mg | ORAL_TABLET | Freq: Two times a day (BID) | ORAL | Status: DC
  Administered 2015-07-16 – 2015-07-17 (×3): 0.5 mg via ORAL
  Filled 2015-07-16 (×3): qty 1

## 2015-07-16 MED ORDER — ACETAMINOPHEN 325 MG PO TABS
650.0000 mg | ORAL_TABLET | ORAL | Status: DC | PRN
  Administered 2015-07-17: 650 mg via ORAL
  Filled 2015-07-16: qty 2

## 2015-07-16 MED ORDER — TRAZODONE HCL 100 MG PO TABS
100.0000 mg | ORAL_TABLET | Freq: Every day | ORAL | Status: DC
  Administered 2015-07-16: 100 mg via ORAL
  Filled 2015-07-16: qty 1

## 2015-07-16 MED ORDER — ZOLPIDEM TARTRATE 5 MG PO TABS: 5.0000 mg | ORAL_TABLET | Freq: Every evening | ORAL | Status: DC | PRN

## 2015-07-16 MED ORDER — ONDANSETRON HCL 4 MG PO TABS
4.0000 mg | ORAL_TABLET | Freq: Three times a day (TID) | ORAL | Status: DC | PRN
Start: 2015-07-16 — End: 2015-07-17

## 2015-07-16 MED ORDER — ALUM & MAG HYDROXIDE-SIMETH 200-200-20 MG/5ML PO SUSP: 30.0000 mL | ORAL | Status: DC | PRN

## 2015-07-16 MED ORDER — RISPERIDONE 1 MG PO TBDP
1.0000 mg | ORAL_TABLET | Freq: Two times a day (BID) | ORAL | Status: DC
  Administered 2015-07-16 – 2015-07-17 (×2): 1 mg via ORAL
  Filled 2015-07-16 (×3): qty 1

## 2015-07-16 MED ORDER — HYDROXYZINE PAMOATE 25 MG PO CAPS
25.0000 mg | ORAL_CAPSULE | Freq: Three times a day (TID) | ORAL | Status: DC | PRN
  Filled 2015-07-16: qty 1

## 2015-07-16 MED ORDER — HYDROXYZINE HCL 25 MG PO TABS: 25.0000 mg | ORAL_TABLET | Freq: Three times a day (TID) | ORAL | Status: DC | PRN

## 2015-07-16 NOTE — ED Notes (Addendum)
Pt states,"the officers brought me in here and I just came her to get my medicine. I am suppose to go home. " Pt requested some cranberry juice. She is pleasant and cooperative. Pt is very concerned about her alopecia.;She keeps inquiring what type of treatment we have for this. Instructed pt to speak to the MD. Pt wants corn for dinner. Cafeteria was out of corn. Pt requested graham crackers and cranberry juice. She continues to be cooperative but is limited.
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
## 2015-07-17 DIAGNOSIS — F22 Delusional disorders: Secondary | ICD-10-CM

## 2015-07-17 MED ORDER — QUETIAPINE FUMARATE 100 MG PO TABS: 100.0000 mg | ORAL_TABLET | Freq: Every day | ORAL | Status: DC

## 2015-07-17 MED ORDER — OXCARBAZEPINE 300 MG PO TABS: 300.0000 mg | ORAL_TABLET | Freq: Two times a day (BID) | ORAL | Status: DC

## 2015-07-17 MED ORDER — OXCARBAZEPINE 300 MG PO TABS
300.0000 mg | ORAL_TABLET | Freq: Two times a day (BID) | ORAL | Status: DC
  Administered 2015-07-17: 300 mg via ORAL
  Filled 2015-07-17: qty 1

## 2015-07-17 NOTE — [facility] Suicide Risk Assessment (Signed)
Suicide Risk Assessment  Discharge Assessment   [facility]   Demographic Factors:  Adolescent or young adult  Total Time spent with patient: 45 minutes  Musculoskeletal: Strength & Muscle Tone: within normal limits Gait & Station: normal Patient leans: N/A  Psychiatric Specialty Exam: Review of Systems  Constitutional: Negative.   HENT: Negative.   Eyes: Negative.   Respiratory: Negative.   Cardiovascular: Negative.   Gastrointestinal: Negative.   Genitourinary: Negative.   Musculoskeletal: Negative.   Skin: Negative.   Neurological: Negative.   Endo/Heme/Allergies: Negative.   Psychiatric/Behavioral:       Delusions    Blood pressure 120/65, pulse 93, temperature 98.1 F (36.7 C), temperature source Oral, resp. rate 18, SpO2 100 %.There is no height or weight on file to calculate BMI.  General Appearance: Casual  Eye Contact::  Good  Speech:  Normal Rate  Volume:  Normal  Mood:  Anxious, mild  Affect:  Congruent  Thought Process:  Tangential  Orientation:  Full (Time, Place, and Person)  Thought Content:  Delusions  Suicidal Thoughts:  No  Homicidal Thoughts:  No  Memory:  Immediate;   Good Recent;   Good Remote;   Good  Judgement:  Fair  Insight:  Fair  Psychomotor Activity:  Normal  Concentration:  Good  Recall:  Good  Fund of Knowledge:Fair  Language: Good  Akathisia:  No  Handed:  Right  AIMS (if indicated):     Assets:  Housing Leisure Time Physical Health Resilience Social Support  ADL's:  Intact  Cognition: Impaired,  Mild  Sleep:         Has this patient used any form of tobacco in the last 30 days? (Cigarettes, Smokeless Tobacco, Cigars, and/or Pipes) No  Mental Status Per Nursing Assessment::   On Admission:   Aggression, delusions  Current Mental Status by Physician: NA  Loss Factors: NA  Historical Factors: NA  Risk Reduction Factors:   Sense of responsibility to family, Living with another  person, especially a relative, Positive social support and Positive therapeutic relationship  Continued Clinical Symptoms:  Delusions  Cognitive Features That Contribute To Risk:  None    Suicide Risk:  Minimal: No identifiable suicidal ideation.  Patients presenting with no risk factors but with morbid ruminations; may be classified as minimal risk based on the severity of the depressive symptoms  Principal Problem: Paranoid type delusional disorder [facility](HCC) Discharge Diagnoses:  Patient Active Problem List   Diagnosis Date Noted  . 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 Mental retardation [F79] 04/28/2015    Priority: High  . Involuntary commitment [Z04.6]       Plan Of Care/Follow-up recommendations:  Activity:  as tolerated Diet:  heart healthy diet  Is patient on multiple antipsychotic therapies at discharge:  No   Has Patient had three or more failed trials of antipsychotic monotherapy by history:  No  Recommended Plan for Multiple Antipsychotic Therapies: NA    LORD, JAMISON, PMH-NP 07/17/2015, 12:19 PM

## 2015-07-17 NOTE — Discharge Instructions (Signed)
For your ongoing mental health needs, you are advised to follow up with [facility].  New and returning patients are seen at their walk-in clinic.  Walk-in hours are Monday - Friday from 8:00 am - 3:00 pm.  Walk-in patients are seen on a first come, first served basis.  Try to arrive as early as possible for he best chance of being seen the same day: ° °     [facility] °     [address] °     [place], [place] [address] °     [phone] °

## 2015-07-17 NOTE — ED Notes (Signed)
Brothers number [phone] Left message for brother to call [facility]

## 2015-07-17 NOTE — ED Notes (Signed)
Attempted to call brother multiple times, left vm on cell phone x2, no returned call, charge rn notified

## 2015-07-17 NOTE — ED Notes (Signed)
Sitter at bedside.
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
## 2015-07-17 NOTE — BH Assessment (Signed)
[facility] Assessment Progress Note  Per Thedore MinsMojeed Akintayo, MD, this pt does not require psychiatric hospitalization at this time.  She presents under IVC initiated by her brother.  [facility]  Dr Jannifer FranklinAkintayo has rescinded IVC.  Discharge instructions advise pt to follow up with [facility].  Pt's nurse has been notified.  Doylene Canninghomas Dmarcus Decicco, MA Triage Specialist [phone]
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
## 2015-07-17 NOTE — ED Notes (Signed)
Spoke to brother, awaiting a call back for tx

## 2015-07-17 NOTE — ED Notes (Signed)
Patients Mother [phone]

## 2015-08-27 ENCOUNTER — Emergency Department (HOSPITAL_COMMUNITY): Payer: Medicaid Other

## 2015-08-27 ENCOUNTER — Encounter (HOSPITAL_COMMUNITY): Payer: Self-pay | Admitting: *Deleted

## 2015-08-27 DIAGNOSIS — Z9889 Other specified postprocedural states: Secondary | ICD-10-CM | POA: Insufficient documentation

## 2015-08-27 DIAGNOSIS — Z8659 Personal history of other mental and behavioral disorders: Secondary | ICD-10-CM | POA: Diagnosis not present

## 2015-08-27 DIAGNOSIS — R51 Headache: Secondary | ICD-10-CM | POA: Insufficient documentation

## 2015-08-27 DIAGNOSIS — G8929 Other chronic pain: Secondary | ICD-10-CM | POA: Diagnosis not present

## 2015-08-27 DIAGNOSIS — Z8679 Personal history of other diseases of the circulatory system: Secondary | ICD-10-CM | POA: Diagnosis not present

## 2015-08-27 DIAGNOSIS — Z79899 Other long term (current) drug therapy: Secondary | ICD-10-CM | POA: Insufficient documentation

## 2015-08-27 DIAGNOSIS — R0789 Other chest pain: Secondary | ICD-10-CM | POA: Insufficient documentation

## 2015-08-27 DIAGNOSIS — R011 Cardiac murmur, unspecified: Secondary | ICD-10-CM | POA: Diagnosis not present

## 2015-08-27 LAB — TROPONIN I

## 2015-08-27 LAB — BASIC METABOLIC PANEL
ANION GAP: 10 (ref 5–15)
BUN: 17 mg/dL (ref 6–20)
CALCIUM: 9.2 mg/dL (ref 8.9–10.3)
CO2: 24 mmol/L (ref 22–32)
Chloride: 104 mmol/L (ref 101–111)
Creatinine, Ser: 0.88 mg/dL (ref 0.44–1.00)
GFR calc Af Amer: >60 mL/min (ref >60)
Glucose, Bld: 81 mg/dL (ref 65–99)
POTASSIUM: 3.9 mmol/L (ref 3.5–5.1)
Sodium: 138 mmol/L (ref 135–145)

## 2015-08-27 LAB — CBC
HCT: 36.2 % (ref 36.0–46.0)
HEMOGLOBIN: 11.6 g/dL — AB (ref 12.0–15.0)
MCH: 29.1 pg (ref 26.0–34.0)
MCHC: 32 g/dL (ref 30.0–36.0)
MCV: 90.7 fL (ref 78.0–100.0)
Platelets: 403 10*3/uL — ABNORMAL HIGH (ref 150–400)
RBC: 3.99 MIL/uL (ref 3.87–5.11)
RDW: 12.7 % (ref 11.5–15.5)
WBC: 5.6 10*3/uL (ref 4.0–10.5)

## 2015-08-27 NOTE — ED Notes (Signed)
The pt is c/o a headache and chest pain for several weeks.  She arrived by gems from home.  lmp today

## 2015-08-27 NOTE — ED Notes (Signed)
The pt reports that she had brain surgery last year

## 2015-08-28 ENCOUNTER — Emergency Department (HOSPITAL_COMMUNITY)
Admission: EM | Admit: 2015-08-28 | Discharge: 2015-08-28 | Disposition: A | Discharge status: Home / Self Care | Payer: Medicaid Other | Attending: Emergency Medicine | Admitting: Emergency Medicine

## 2015-08-28 DIAGNOSIS — R51 Headache: Secondary | ICD-10-CM

## 2015-08-28 DIAGNOSIS — R0789 Other chest pain: Secondary | ICD-10-CM

## 2015-08-28 DIAGNOSIS — R519 Headache, unspecified: Secondary | ICD-10-CM

## 2015-08-28 MED ORDER — ACETAMINOPHEN 325 MG PO TABS
650.0000 mg | ORAL_TABLET | ORAL | Status: DC | PRN
  Administered 2015-08-28: 650 mg via ORAL
  Filled 2015-08-28: qty 2

## 2015-08-28 NOTE — Discharge Instructions (Signed)
Nonspecific Chest Pain  °Chest pain can be caused by many different conditions. There is always a chance that your pain could be related to something serious, such as a heart attack or a blood clot in your lungs. Chest pain can also be caused by conditions that are not life-threatening. If you have chest pain, it is very important to follow up with your health care provider. °CAUSES  °Chest pain can be caused by: °· Heartburn. °· Pneumonia or bronchitis. °· Anxiety or stress. °· Inflammation around your heart (pericarditis) or lung (pleuritis or pleurisy). °· A blood clot in your lung. °· A collapsed lung (pneumothorax). It can develop suddenly on its own (spontaneous pneumothorax) or from trauma to the chest. °· Shingles infection (varicella-zoster virus). °· Heart attack. °· Damage to the bones, muscles, and cartilage that make up your chest wall. This can include: °· Bruised bones due to injury. °· Strained muscles or cartilage due to frequent or repeated coughing or overwork. °· Fracture to one or more ribs. °· Sore cartilage due to inflammation (costochondritis). °RISK FACTORS  °Risk factors for chest pain may include: °· Activities that increase your risk for trauma or injury to your chest. °· Respiratory infections or conditions that cause frequent coughing. °· Medical conditions or overeating that can cause heartburn. °· Heart disease or family history of heart disease. °· Conditions or health behaviors that increase your risk of developing a blood clot. °· Having had chicken pox (varicella zoster). °SIGNS AND SYMPTOMS °Chest pain can feel like: °· Burning or tingling on the surface of your chest or deep in your chest. °· Crushing, pressure, aching, or squeezing pain. °· Dull or sharp pain that is worse when you move, cough, or take a deep breath. °· Pain that is also felt in your back, neck, shoulder, or arm, or pain that spreads to any of these areas. °Your chest pain may come and go, or it may stay  constant. °DIAGNOSIS °Lab tests or other studies may be needed to find the cause of your pain. Your health care provider may have you take a test called an ambulatory ECG (electrocardiogram). An ECG records your heartbeat patterns at the time the test is performed. You may also have other tests, such as: °· Transthoracic echocardiogram (TTE). During echocardiography, sound waves are used to create a picture of all of the heart structures and to look at how blood flows through your heart. °· Transesophageal echocardiogram (TEE). This is a more advanced imaging test that obtains images from inside your body. It allows your health care provider to see your heart in finer detail. °· Cardiac monitoring. This allows your health care provider to monitor your heart rate and rhythm in real time. °· Holter monitor. This is a portable device that records your heartbeat and can help to diagnose abnormal heartbeats. It allows your health care provider to track your heart activity for several days, if needed. °· Stress tests. These can be done through exercise or by taking medicine that makes your heart beat more quickly. °· Blood tests. °· Imaging tests. °TREATMENT  °Your treatment depends on what is causing your chest pain. Treatment may include: °· Medicines. These may include: °· Acid blockers for heartburn. °· Anti-inflammatory medicine. °· Pain medicine for inflammatory conditions. °· Antibiotic medicine, if an infection is present. °· Medicines to dissolve blood clots. °· Medicines to treat coronary artery disease. °· Supportive care for conditions that do not require medicines. This may include: °· Resting. °· Applying heat   or cold packs to injured areas. °· Limiting activities until pain decreases. °HOME CARE INSTRUCTIONS °· If you were prescribed an antibiotic medicine, finish it all even if you start to feel better. °· Avoid any activities that bring on chest pain. °· Do not use any tobacco products, including  cigarettes, chewing tobacco, or electronic cigarettes. If you need help quitting, ask your health care provider. °· Do not drink alcohol. °· Take medicines only as directed by your health care provider. °· Keep all follow-up visits as directed by your health care provider. This is important. This includes any further testing if your chest pain does not go away. °· If heartburn is the cause for your chest pain, you may be told to keep your head raised (elevated) while sleeping. This reduces the chance that acid will go from your stomach into your esophagus. °· Make lifestyle changes as directed by your health care provider. These may include: °· Getting regular exercise. Ask your health care provider to suggest some activities that are safe for you. °· Eating a heart-healthy diet. A registered dietitian can help you to learn healthy eating options. °· Maintaining a healthy weight. °· Managing diabetes, if necessary. °· Reducing stress. °SEEK MEDICAL CARE IF: °· Your chest pain does not go away after treatment. °· You have a rash with blisters on your chest. °· You have a fever. °SEEK IMMEDIATE MEDICAL CARE IF:  °· Your chest pain is worse. °· You have an increasing cough, or you cough up blood. °· You have severe abdominal pain. °· You have severe weakness. °· You faint. °· You have chills. °· You have sudden, unexplained chest discomfort. °· You have sudden, unexplained discomfort in your arms, back, neck, or jaw. °· You have shortness of breath at any time. °· You suddenly start to sweat, or your skin gets clammy. °· You feel nauseous or you vomit. °· You suddenly feel light-headed or dizzy. °· Your heart begins to beat quickly, or it feels like it is skipping beats. °These symptoms may represent a serious problem that is an emergency. Do not wait to see if the symptoms will go away. Get medical help right away. Call your local emergency services (911 in the [place] Do not drive yourself to the hospital. °  °This  information is not intended to replace advice given to you by your health care provider. Make sure you discuss any questions you have with your health care provider. °  °Document Released: 03/30/2005 Document Revised: 07/11/2014 Document Reviewed: 01/24/2014 °Elsevier Interactive Patient Education ©2016 [employer] °General Headache Without Cause °A headache is pain or discomfort felt around the head or neck area. The specific cause of a headache may not be found. There are many causes and types of headaches. A few common ones are: °· Tension headaches. °· Migraine headaches. °· Cluster headaches. °· Chronic daily headaches. °HOME CARE INSTRUCTIONS  °Watch your condition for any changes. Take these steps to help with your condition: °Managing Pain °· Take over-the-counter and prescription medicines only as told by your health care provider. °· Lie down in a dark, quiet room when you have a headache. °· If directed, apply ice to the head and neck area: °¨ Put ice in a plastic bag. °¨ Place a towel between your skin and the bag. °¨ Leave the ice on for 20 minutes, 2-3 times per day. °· Use a heating pad or hot shower to apply heat to the head and neck area as told by   your health care provider. °· Keep lights dim if bright lights bother you or make your headaches worse. °Eating and Drinking °· Eat meals on a regular schedule. °· Limit alcohol use. °· Decrease the amount of caffeine you drink, or stop drinking caffeine. °General Instructions °· Keep all follow-up visits as told by your health care provider. This is important. °· Keep a headache journal to help find out what may trigger your headaches. For example, write down: °¨ What you eat and drink. °¨ How much sleep you get. °¨ Any change to your diet or medicines. °· Try massage or other relaxation techniques. °· Limit stress. °· Sit up straight, and do not tense your muscles. °· Do not use tobacco products, including cigarettes, chewing tobacco, or e-cigarettes.  If you need help quitting, ask your health care provider. °· Exercise regularly as told by your health care provider. °· Sleep on a regular schedule. Get 7-9 hours of sleep, or the amount recommended by your health care provider. °SEEK MEDICAL CARE IF:  °· Your symptoms are not helped by medicine. °· You have a headache that is different from the usual headache. °· You have nausea or you vomit. °· You have a fever. °SEEK IMMEDIATE MEDICAL CARE IF:  °· Your headache becomes severe. °· You have repeated vomiting. °· You have a stiff neck. °· You have a loss of vision. °· You have problems with speech. °· You have pain in the eye or ear. °· You have muscular weakness or loss of muscle control. °· You lose your balance or have trouble walking. °· You feel faint or pass out. °· You have confusion. °  °This information is not intended to replace advice given to you by your health care provider. Make sure you discuss any questions you have with your health care provider. °  °Document Released: 06/20/2005 Document Revised: 03/11/2015 Document Reviewed: 10/13/2014 °Elsevier Interactive Patient Education ©2016 [employer] ° °

## 2015-08-28 NOTE — ED Notes (Signed)
Security and [employer] escorted pt off of unit.

## 2015-08-28 NOTE — ED Provider Notes (Signed)
CSN: [id number]     Arrival date & time 08/27/15  1957 History   By signing my name below, I, Freida Busman, attest that this documentation has been prepared under the direction and in the presence of Loren Racer, MD . Electronically Signed: Freida Busman, Scribe. 08/28/2015. 1:20 AM.    Chief Complaint  Patient presents with  . Chest Pain  . Headache   The history is provided by the patient. No language interpreter was used.     HPI Comments:  Wendy Berry is a 30 y.o. female who presents to the Emergency Department complaining of 10/10 HA. 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 Pt states that she is here because she "needs an IV for her brain". No alleviating factors noted.  No PCP  Past Medical History  Diagnosis Date  . Heart murmur   . Cerebral aneurysm   . Mental retardation    Past Surgical History  Procedure Laterality Date  . Brain surgery     No family history on file. Social History  Substance Use Topics  . Smoking status: Never Smoker   . Smokeless tobacco: None  . Alcohol Use: No   OB History    No data available     Review of Systems  Constitutional: Negative for fever and chills.  Respiratory: Negative for shortness of breath.   Cardiovascular: Negative for chest pain.  Gastrointestinal: Negative for nausea, vomiting and abdominal pain.  Musculoskeletal: Negative for back pain and neck pain.  Skin: Negative for wound.  Neurological: Positive for headaches. Negative for dizziness, syncope, weakness and numbness.  All other systems reviewed and are negative.   Allergies  Review of patient's allergies indicates no known allergies.  Home Medications   Prior to Admission medications   Medication Sig Start Date End Date Taking? Authorizing Provider  hydrOXYzine (VISTARIL) 25 MG capsule Take 25 mg by mouth 3 (three) times daily as needed for anxiety.   Yes Historical Provider, MD  Oxcarbazepine (TRILEPTAL) 300 MG  tablet Take 1 tablet (300 mg total) by mouth 2 (two) times daily. 07/17/15  Yes Charm Rings, NP  QUEtiapine (SEROQUEL) 100 MG tablet Take 1 tablet (100 mg total) by mouth at bedtime. 07/17/15  Yes Charm Rings, NP   BP 130/94 mmHg  Pulse 70  Temp(Src) 98.4 F (36.9 C) (Oral)  Resp 14  Ht  (1.626 m)  SpO2 100%  LMP 08/26/2015 Physical Exam  Constitutional: She is oriented to person, place, and time. She appears well-developed and well-nourished. No distress.  HENT:  Head: Normocephalic and atraumatic.  Mouth/Throat: Oropharynx is clear and moist.  No surgical scars  Eyes: EOM are normal. Pupils are equal, round, and reactive to light.  Neck: Normal range of motion. Neck supple.  No posterior midline cervical tenderness to palpation.  Cardiovascular: Normal rate and regular rhythm.   Pulmonary/Chest: Effort normal and breath sounds normal. No respiratory distress. She has no wheezes. She has no rales. She exhibits tenderness (chest tenderness reproduced with palpation over the left chest.).  Abdominal: Soft. Bowel sounds are normal. She exhibits no distension and no mass. There is no tenderness. There is no rebound and no guarding.  Musculoskeletal: Normal range of motion. She exhibits no edema or tenderness.  Neurological: She is alert and oriented to person, place, and time.  Patient with inconsistent exam. When asked to squeeze fingers patient states that she can't. She is clearly holding a cell phone  with no problems. She is moving all extremities except when asked to. She is ambulatory without any difficulty.  Skin: Skin is warm and dry. No rash noted. No erythema.  Psychiatric:  Malingering behavior  Nursing note and vitals reviewed.   ED Course  Procedures   DIAGNOSTIC STUDIES:  Oxygen Saturation is 98% on RA, normal by my interpretation.    COORDINATION OF CARE:  1:20 AM Discussed treatment plan with pt at bedside and pt agreed to plan.  Labs Review Labs  Reviewed  CBC - Abnormal; Notable for the following:    Hemoglobin 11.6 (*)    Platelets 403 (*)    All other components within normal limits  BASIC METABOLIC PANEL  TROPONIN I    Imaging Review Dg Chest 2 View  08/27/2015  CLINICAL DATA:  Chest pain for several weeks. EXAM: CHEST  2 VIEW COMPARISON:  04/27/2015 FINDINGS: Lateral film motion degraded. The lungs are clear wiithout focal pneumonia, edema, pneumothorax or pleural effusion. Cardiopericardial silhouette is at upper limits of normal for size. The visualized bony structures of the thorax are intact. IMPRESSION: Stable.  No acute findings. Electronically Signed   By: Kennith Center M.D.   On: 08/27/2015 21:18   I have personally reviewed and evaluated these images and lab results as part of my medical decision-making.   EKG Interpretation   Date/Time:  Thursday August 27 2015 20:20:24 EST Ventricular Rate:  73 PR Interval:  170 QRS Duration: 82 QT Interval:  362 QTC Calculation: 398 R Axis:   51 Text Interpretation:  Normal sinus rhythm with sinus arrhythmia  Nonspecific ST abnormality Abnormal ECG Confirmed by Shelisa Fern  MD, Philis Doke  ([id number]) on 08/28/2015 1:19:57 AM      MDM   Final diagnoses:  Intractable headache, unspecified chronicity pattern, unspecified headache type  Atypical chest pain    I personally performed the services described in this documentation, which was scribed in my presence. The recorded information has been reviewed and is accurate.  Chest pain is reproduced with palpation. There is no obvious injury. Likely chest wall pain. EKG without any evidence of ischemia. Low suspicion for coronary artery disease, PE. Review of patient's records so that she was in the emergency department March 3 of 2016. This time she states she had brain surgery. She since that time and had a CT which showed no acute intra cranial injury. There is no evidence the patient has had brain surgery in the past. She has a  normal neurologic exam when distracted. She is asking for an IV and to be admitted to the hospital. Do not believe that either is needed. Patient with no apparent life-threatening injury or illness. We'll discharge home to follow-up with her primary physician.   Loren Racer, MD 08/28/15 [phone]

## 2015-08-28 NOTE — ED Notes (Addendum)
This RN was going over the pts discharge instructions when the pt stated "I filmed yall", this RN told the pt that filming, recording and photography are not allowed and that I would have to inform security. The pt then stated "I said I didn't film yall!". The pt then went on to say "It's illegal for you not to take care of me!" this RN told the pt that she was examined and it was determined that she was not having a life threatening event and that she was stable to go home. Security and [employer] at bedside.

## 2015-09-10 ENCOUNTER — Emergency Department (HOSPITAL_COMMUNITY)
Admission: EM | Admit: 2015-09-10 | Discharge: 2015-09-10 | Disposition: A | Discharge status: Home / Self Care | Payer: Medicaid Other | Attending: Emergency Medicine | Admitting: Emergency Medicine

## 2015-09-10 ENCOUNTER — Encounter (HOSPITAL_COMMUNITY): Payer: Self-pay | Admitting: *Deleted

## 2015-09-10 DIAGNOSIS — Z8679 Personal history of other diseases of the circulatory system: Secondary | ICD-10-CM | POA: Diagnosis not present

## 2015-09-10 DIAGNOSIS — R011 Cardiac murmur, unspecified: Secondary | ICD-10-CM | POA: Diagnosis not present

## 2015-09-10 DIAGNOSIS — F22 Delusional disorders: Secondary | ICD-10-CM | POA: Diagnosis not present

## 2015-09-10 DIAGNOSIS — L659 Nonscarring hair loss, unspecified: Secondary | ICD-10-CM | POA: Insufficient documentation

## 2015-09-10 DIAGNOSIS — Z79899 Other long term (current) drug therapy: Secondary | ICD-10-CM | POA: Diagnosis not present

## 2015-09-10 LAB — COMPREHENSIVE METABOLIC PANEL
ALT: 13 U/L — ABNORMAL LOW (ref 14–54)
AST: 22 U/L (ref 15–41)
Albumin: 3.6 g/dL (ref 3.5–5.0)
Alkaline Phosphatase: 70 U/L (ref 38–126)
Anion gap: 8 (ref 5–15)
BILIRUBIN TOTAL: 0.9 mg/dL (ref 0.3–1.2)
BUN: 18 mg/dL (ref 6–20)
CHLORIDE: 105 mmol/L (ref 101–111)
CO2: 27 mmol/L (ref 22–32)
CREATININE: 1 mg/dL (ref 0.44–1.00)
Calcium: 9.4 mg/dL (ref 8.9–10.3)
Glucose, Bld: 72 mg/dL (ref 65–99)
POTASSIUM: 4.3 mmol/L (ref 3.5–5.1)
Sodium: 140 mmol/L (ref 135–145)
TOTAL PROTEIN: 8 g/dL (ref 6.5–8.1)

## 2015-09-10 LAB — CBC
HCT: 37.8 % (ref 36.0–46.0)
HEMOGLOBIN: 12.7 g/dL (ref 12.0–15.0)
MCH: 30.5 pg (ref 26.0–34.0)
MCHC: 33.6 g/dL (ref 30.0–36.0)
MCV: 90.6 fL (ref 78.0–100.0)
Platelets: 368 10*3/uL (ref 150–400)
RBC: 4.17 MIL/uL (ref 3.87–5.11)
RDW: 12.8 % (ref 11.5–15.5)
WBC: 6.6 10*3/uL (ref 4.0–10.5)

## 2015-09-10 LAB — RAPID URINE DRUG SCREEN, HOSP PERFORMED
AMPHETAMINES: NOT DETECTED
BENZODIAZEPINES: NOT DETECTED
Barbiturates: NOT DETECTED
COCAINE: NOT DETECTED
OPIATES: NOT DETECTED
TETRAHYDROCANNABINOL: NOT DETECTED

## 2015-09-10 LAB — SALICYLATE LEVEL

## 2015-09-10 LAB — ETHANOL

## 2015-09-10 LAB — ACETAMINOPHEN LEVEL: Acetaminophen (Tylenol), Serum: <10 ug/mL — ABNORMAL LOW (ref 10–30)

## 2015-09-10 NOTE — ED Notes (Signed)
Pt states "I wanted to be treated for my hair, my hair keeps falling out".
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
## 2015-09-10 NOTE — ED Notes (Signed)
Pt in states, "I need something for my hair. It is falling out where I had my brain surgery." pt confused, pt states, "It is April." pt Alert x3, pt paranoid, pt states, "Someone has survelllance video in my house to try to get me hurt. I don't understand stuff." pt calm at this time
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
## 2015-09-10 NOTE — Discharge Instructions (Signed)
Alopecia Areata  Alopecia areata is a type of hair loss. If you have this condition, you may lose hair on your scalp in patches. In some cases, you may lose all the hair on your scalp (alopecia totalis) or all the hair from your face and body (alopecia universalis).   Alopecia areata is an autoimmune disease. This means your body's defense system (immune system) mistakes normal parts of your body for germs or other things that can make you sick. When you have alopecia areata, your immune system attacks your hair follicles.   Alopecia areata often starts during childhood but can occur at any age. Alopecia areata is not a danger to your health but can be stressful.   CAUSES   The cause of alopecia areata is unknown.   RISK FACTORS  You may be at higher risk of alopecia areata if you:   · Have a family history of alopecia.  · Have a family history of another autoimmune disease, including type 1 diabetes and rheumatoid arthritis.  SIGNS AND SYMPTOMS  Signs of alopecia areata may include:  · Loss of scalp hair in small, round patches. These may be about the size of a quarter.  · Loss of all hair on your scalp.  · Loss of eyebrow hair, facial hair, or the hair inside your nose (nasal hair).  · Hair loss over your entire body.  DIAGNOSIS   Alopecia areata may be diagnosed by:  · Medical history and physical exam.  · Taking a sample of hair to check under a microscope.  · Taking a small piece of skin (biopsy) to examine under a microscope.  · Blood tests to rule out other autoimmune diseases.  TREATMENT   There is no cure for alopecia areata, but the disease often goes away over time. You will not lose the ability to regrow hair. Some medicines may help your hair regrow more quickly. These include:  · Corticosteroids. These block inflammation caused by your immune system. You may get this medicine as a lotion for your skin or as an injection.  · Minoxidil. This is a hair growth medicine you can use in areas of hair  loss.  · Anthralin. This is a medicine for a skin inflammation called psoriasis that may also help alopecia.  · Diphencyprone. This medicine is applied to your skin and may stimulate hair growth.  HOME CARE INSTRUCTIONS  · Use sunscreen or cover your head when outdoors.  · Take medicines only as directed by your health care provider.  · If you have lost your eyebrows, wear sunglasses outside to keep dust out of your eyes.  · If you have lost hair inside your nose, wear a kerchief over your face or apply ointment to the inside of your nose. This keeps out dust and other irritants.  · Keep all follow-up visits as directed by your health care provider. This is important.  SEEK MEDICAL CARE IF:  · Your symptoms change.  · You have new symptoms.  · You have a reaction to your medicines.  · You are struggling emotionally.     This information is not intended to replace advice given to you by your health care provider. Make sure you discuss any questions you have with your health care provider.     Document Released: 01/23/2004 Document Revised: 07/11/2014 Document Reviewed: 09/09/2013  Elsevier Interactive Patient Education ©2016 [employer] Inc[employer]

## 2015-09-10 NOTE — ED Notes (Signed)
This Rn walked with the pt outside to speak with a female who gave her a ride to the hospital, pt returned to the triage waiting area

## 2015-09-18 ENCOUNTER — Encounter (HOSPITAL_COMMUNITY): Payer: Self-pay | Admitting: Emergency Medicine

## 2015-09-18 ENCOUNTER — Emergency Department (HOSPITAL_COMMUNITY)
Admission: EM | Admit: 2015-09-18 | Discharge: 2015-09-18 | Disposition: A | Discharge status: Home / Self Care | Payer: Medicaid Other | Attending: Emergency Medicine | Admitting: Emergency Medicine

## 2015-09-18 DIAGNOSIS — F22 Delusional disorders: Secondary | ICD-10-CM | POA: Diagnosis not present

## 2015-09-18 DIAGNOSIS — R51 Headache: Secondary | ICD-10-CM | POA: Insufficient documentation

## 2015-09-18 DIAGNOSIS — R011 Cardiac murmur, unspecified: Secondary | ICD-10-CM | POA: Diagnosis not present

## 2015-09-18 DIAGNOSIS — Z9889 Other specified postprocedural states: Secondary | ICD-10-CM | POA: Diagnosis not present

## 2015-09-18 DIAGNOSIS — Z79899 Other long term (current) drug therapy: Secondary | ICD-10-CM | POA: Insufficient documentation

## 2015-09-18 DIAGNOSIS — F419 Anxiety disorder, unspecified: Secondary | ICD-10-CM | POA: Insufficient documentation

## 2015-09-18 DIAGNOSIS — Z8679 Personal history of other diseases of the circulatory system: Secondary | ICD-10-CM | POA: Insufficient documentation

## 2015-09-18 DIAGNOSIS — F411 Generalized anxiety disorder: Secondary | ICD-10-CM

## 2015-09-18 DIAGNOSIS — R519 Headache, unspecified: Secondary | ICD-10-CM

## 2015-09-18 MED ORDER — KETOROLAC TROMETHAMINE 30 MG/ML IJ SOLN
30.0000 mg | Freq: Once | INTRAMUSCULAR | Status: AC
  Administered 2015-09-18: 30 mg via INTRAVENOUS
  Filled 2015-09-18: qty 1

## 2015-09-18 NOTE — Discharge Instructions (Signed)
Please follow up with your primary care provider within one week. Return to the ER for new or worsening symptoms.

## 2015-09-18 NOTE — ED Provider Notes (Signed)
CSN: [id number]     Arrival date & time 09/18/15  0746 History   First MD Initiated Contact with Patient 09/18/15 [phone]     Chief Complaint  Patient presents with  . Headache    HPI  [name] is an 30 y.o. female with history of MR, chronic paranoia, who presents to the ED for evaluation of headache. She states her headache has been intermittent for the past "few years" since her brain surgery. She is unable to clarify when her brain surgery was. She reports history of cerebral aneurysm and brain surgery but I was not able to find record of such in EMR review. She does appear to have several ED visits for similar complaints, with negative head CT as recently as 04/2015. Pt denies visual disturbance, dizziness, n/v. States she hurts in her right parietal region. She states she "needs an IV" to help with her brain. During our conversation she is at times paranoid with tangential speech, often mentioning that "they are trying to take" her social security. This paranoia appears to be chronic.   Past Medical History  Diagnosis Date  . Heart murmur   . Cerebral aneurysm   . Mental retardation    Past Surgical History  Procedure Laterality Date  . Brain surgery     No family history on file. Social History  Substance Use Topics  . Smoking status: Never Smoker   . Smokeless tobacco: Not on file  . Alcohol Use: No   OB History    No data available     Review of Systems  All other systems reviewed and are negative.     Allergies  Review of patient's allergies indicates no known allergies.  Home Medications   Prior to Admission medications   Medication Sig Start Date End Date Taking? Authorizing Provider  hydrOXYzine (VISTARIL) 25 MG capsule Take 25 mg by mouth 3 (three) times daily as needed for anxiety.    Historical Provider, MD  Oxcarbazepine (TRILEPTAL) 300 MG tablet Take 1 tablet (300 mg total) by mouth 2 (two) times daily. 07/17/15   Charm RingsJamison Y Lord, NP  QUEtiapine  (SEROQUEL) 100 MG tablet Take 1 tablet (100 mg total) by mouth at bedtime. 07/17/15   Charm RingsJamison Y Lord, NP   BP 120/93 mmHg  Pulse 89  Temp(Src) 97.9 F (36.6 C) (Oral)  Resp 16  SpO2 100%  LMP 08/25/2015 Physical Exam  Constitutional: She is oriented to person, place, and time. No distress.  HENT:  Head: Atraumatic.  Right Ear: External ear normal.  Left Ear: External ear normal.  Nose: Nose normal.  Eyes: Conjunctivae are normal. No scleral icterus.  Neck: Normal range of motion. Neck supple.  Cardiovascular: Normal rate and regular rhythm.   Pulmonary/Chest: Effort normal. No respiratory distress. She exhibits no tenderness.  Abdominal: Soft. She exhibits no distension. There is no tenderness.  Neurological: She is alert and oriented to person, place, and time. She has normal strength. No cranial nerve deficit or sensory deficit. Coordination and gait normal.  Skin: Skin is warm and dry. She is not diaphoretic.  Psychiatric: She has a normal mood and affect. Her behavior is normal. Her speech is tangential. Thought content is paranoid.  Odd affect. Tangential speech.   Nursing note and vitals reviewed.   ED Course  Procedures (including critical care time) Labs Review Labs Reviewed - No data to display  Imaging Review No results found. I have personally reviewed and evaluated these images and lab results as  part of my medical decision-making.   EKG Interpretation None      MDM   Final diagnoses:  Acute nonintractable headache, unspecified headache type  Anxiety state  Paranoid type delusional disorder (HCC)    Pt is an 30 y.o. female with history of MR and chronic paranoia, presenting to the ED for evaluation of headache. This headache appears to be chronic and intermittent. Her neuro exam is intact and nonfocal. She is afebrile with no meningismus. Her mental status is at baseline. She is requesting an "IV to help my brain." Pain much improved with IV toradol.  Instructed to f/u with psych and PCP. She verbalized her agreement and understanding.     Carlene Coria, PA-C 09/18/15 1424  Gerhard Munch, MD 09/18/15 [phone]

## 2015-09-18 NOTE — ED Notes (Signed)
Per EMS patient c/o headache onset 1 year ago after brain tumor removal surgery, worsened today. Extensive psychiatric history.

## 2015-09-18 NOTE — ED Notes (Signed)
Bed: WA03 Expected date:  Expected time:  Means of arrival:  Comments: 13F/headache

## 2015-09-23 ENCOUNTER — Encounter (HOSPITAL_COMMUNITY): Payer: Self-pay | Admitting: Emergency Medicine

## 2015-09-23 ENCOUNTER — Emergency Department (HOSPITAL_COMMUNITY)
Admission: EM | Admit: 2015-09-23 | Discharge: 2015-09-23 | Disposition: A | Discharge status: Home / Self Care | Payer: Medicaid Other | Attending: Emergency Medicine | Admitting: Emergency Medicine

## 2015-09-23 DIAGNOSIS — R51 Headache: Secondary | ICD-10-CM | POA: Diagnosis present

## 2015-09-23 DIAGNOSIS — R011 Cardiac murmur, unspecified: Secondary | ICD-10-CM | POA: Insufficient documentation

## 2015-09-23 DIAGNOSIS — G8929 Other chronic pain: Secondary | ICD-10-CM | POA: Insufficient documentation

## 2015-09-23 DIAGNOSIS — M25579 Pain in unspecified ankle and joints of unspecified foot: Secondary | ICD-10-CM | POA: Insufficient documentation

## 2015-09-23 NOTE — ED Notes (Signed)
Per EMS-states chronic ankle and head pain since 2016

## 2015-09-25 ENCOUNTER — Encounter (HOSPITAL_COMMUNITY): Payer: Self-pay | Admitting: *Deleted

## 2015-09-25 ENCOUNTER — Emergency Department (HOSPITAL_COMMUNITY)
Admission: EM | Admit: 2015-09-25 | Discharge: 2015-09-25 | Disposition: A | Discharge status: Home / Self Care | Payer: Medicaid Other | Attending: Emergency Medicine | Admitting: Emergency Medicine

## 2015-09-25 DIAGNOSIS — R011 Cardiac murmur, unspecified: Secondary | ICD-10-CM | POA: Insufficient documentation

## 2015-09-25 DIAGNOSIS — Z79899 Other long term (current) drug therapy: Secondary | ICD-10-CM | POA: Insufficient documentation

## 2015-09-25 DIAGNOSIS — M25572 Pain in left ankle and joints of left foot: Secondary | ICD-10-CM | POA: Insufficient documentation

## 2015-09-25 DIAGNOSIS — Z87828 Personal history of other (healed) physical injury and trauma: Secondary | ICD-10-CM | POA: Insufficient documentation

## 2015-09-25 DIAGNOSIS — Z8679 Personal history of other diseases of the circulatory system: Secondary | ICD-10-CM | POA: Insufficient documentation

## 2015-09-25 DIAGNOSIS — Z8659 Personal history of other mental and behavioral disorders: Secondary | ICD-10-CM | POA: Diagnosis not present

## 2015-09-25 DIAGNOSIS — G8929 Other chronic pain: Secondary | ICD-10-CM | POA: Diagnosis not present
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
## 2015-09-25 NOTE — ED Notes (Signed)
To ED for eval of left foot/ankle pain. No injury noted. Pt ambulatory without any difficulty.

## 2015-09-25 NOTE — ED Notes (Signed)
Pt in hallway demanding a 'ticket  for a taxi". States she does not want to wait for case management. Pt encouraged to get medicines. Given a bus ticket.

## 2015-10-02 ENCOUNTER — Emergency Department (HOSPITAL_COMMUNITY)
Admission: EM | Admit: 2015-10-02 | Discharge: 2015-10-02 | Disposition: A | Discharge status: Home / Self Care | Payer: Medicaid Other | Attending: Emergency Medicine | Admitting: Emergency Medicine

## 2015-10-02 ENCOUNTER — Encounter (HOSPITAL_COMMUNITY): Payer: Self-pay | Admitting: Emergency Medicine

## 2015-10-02 ENCOUNTER — Emergency Department (HOSPITAL_COMMUNITY): Payer: Medicaid Other

## 2015-10-02 DIAGNOSIS — R011 Cardiac murmur, unspecified: Secondary | ICD-10-CM | POA: Insufficient documentation

## 2015-10-02 DIAGNOSIS — Z87828 Personal history of other (healed) physical injury and trauma: Secondary | ICD-10-CM | POA: Insufficient documentation

## 2015-10-02 DIAGNOSIS — G8929 Other chronic pain: Secondary | ICD-10-CM | POA: Diagnosis not present

## 2015-10-02 DIAGNOSIS — L84 Corns and callosities: Secondary | ICD-10-CM | POA: Diagnosis not present

## 2015-10-02 DIAGNOSIS — Z79899 Other long term (current) drug therapy: Secondary | ICD-10-CM | POA: Diagnosis not present

## 2015-10-02 DIAGNOSIS — F79 Unspecified intellectual disabilities: Secondary | ICD-10-CM | POA: Diagnosis not present

## 2015-10-02 DIAGNOSIS — L03116 Cellulitis of left lower limb: Secondary | ICD-10-CM | POA: Diagnosis not present

## 2015-10-02 DIAGNOSIS — Z8679 Personal history of other diseases of the circulatory system: Secondary | ICD-10-CM | POA: Insufficient documentation

## 2015-10-02 DIAGNOSIS — M79672 Pain in left foot: Secondary | ICD-10-CM | POA: Diagnosis present

## 2015-10-02 MED ORDER — CIPROFLOXACIN HCL 500 MG PO TABS: 500.0000 mg | ORAL_TABLET | Freq: Two times a day (BID) | ORAL | Status: DC

## 2015-10-02 NOTE — ED Notes (Signed)
When informed of discharge, pt became verbally abusive and insisted that she needed to be admitted for the sore on her foot. I reviewed the EDP's d/c instructions, medication, and avenues of pain management. Pt insisted upon receiving a cab voucher, and became verbally abusive again upon being told that she was denied such a voucher. I washed and wrapped pt's L foot upon request, and pt stood under her own power to transfer to a wheelchair. I escorted pt to waiting area and took her to one of the public phones so she could make a call. Pt departed in NAD.

## 2015-10-02 NOTE — Discharge Instructions (Signed)
Cipro as prescribed.  Ibuprofen 600 mg every 6 hours as needed for pain.   Cellulitis Cellulitis is an infection of the skin and the tissue beneath it. The infected area is usually red and tender. Cellulitis occurs most often in the arms and lower legs.  CAUSES  Cellulitis is caused by bacteria that enter the skin through cracks or cuts in the skin. The most common types of bacteria that cause cellulitis are staphylococci and streptococci. SIGNS AND SYMPTOMS   Redness and warmth.  Swelling.  Tenderness or pain.  Fever. DIAGNOSIS  Your health care provider can usually determine what is wrong based on a physical exam. Blood tests may also be done. TREATMENT  Treatment usually involves taking an antibiotic medicine. HOME CARE INSTRUCTIONS   Take your antibiotic medicine as directed by your health care provider. Finish the antibiotic even if you start to feel better.  Keep the infected arm or leg elevated to reduce swelling.  Apply a warm cloth to the affected area up to 4 times per day to relieve pain.  Take medicines only as directed by your health care provider.  Keep all follow-up visits as directed by your health care provider. SEEK MEDICAL CARE IF:   You notice red streaks coming from the infected area.  Your red area gets larger or turns dark in color.  Your bone or joint underneath the infected area becomes painful after the skin has healed.  Your infection returns in the same area or another area.  You notice a swollen bump in the infected area.  You develop new symptoms.  You have a fever. SEEK IMMEDIATE MEDICAL CARE IF:   You feel very sleepy.  You develop vomiting or diarrhea.  You have a general ill feeling (malaise) with muscle aches and pains.   This information is not intended to replace advice given to you by your health care provider. Make sure you discuss any questions you have with your health care provider.   Document Released: 03/30/2005  Document Revised: 03/11/2015 Document Reviewed: 09/05/2011 Elsevier Interactive Patient Education [employer]2016 [employer] Inc[employer]
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
## 2015-11-13 ENCOUNTER — Emergency Department (HOSPITAL_COMMUNITY)
Admission: EM | Admit: 2015-11-13 | Discharge: 2015-11-13 | Disposition: A | Discharge status: Home / Self Care | Payer: Medicaid Other | Attending: Emergency Medicine | Admitting: Emergency Medicine

## 2015-11-13 ENCOUNTER — Encounter (HOSPITAL_COMMUNITY): Payer: Self-pay | Admitting: *Deleted

## 2015-11-13 ENCOUNTER — Emergency Department (HOSPITAL_COMMUNITY): Payer: Medicaid Other

## 2015-11-13 DIAGNOSIS — Z8679 Personal history of other diseases of the circulatory system: Secondary | ICD-10-CM | POA: Insufficient documentation

## 2015-11-13 DIAGNOSIS — S199XXA Unspecified injury of neck, initial encounter: Secondary | ICD-10-CM | POA: Diagnosis not present

## 2015-11-13 DIAGNOSIS — F419 Anxiety disorder, unspecified: Secondary | ICD-10-CM | POA: Insufficient documentation

## 2015-11-13 DIAGNOSIS — Y998 Other external cause status: Secondary | ICD-10-CM | POA: Insufficient documentation

## 2015-11-13 DIAGNOSIS — S29001A Unspecified injury of muscle and tendon of front wall of thorax, initial encounter: Secondary | ICD-10-CM | POA: Insufficient documentation

## 2015-11-13 DIAGNOSIS — Z79899 Other long term (current) drug therapy: Secondary | ICD-10-CM | POA: Insufficient documentation

## 2015-11-13 DIAGNOSIS — Z792 Long term (current) use of antibiotics: Secondary | ICD-10-CM | POA: Insufficient documentation

## 2015-11-13 DIAGNOSIS — Y92410 Unspecified street and highway as the place of occurrence of the external cause: Secondary | ICD-10-CM | POA: Diagnosis not present

## 2015-11-13 DIAGNOSIS — R079 Chest pain, unspecified: Secondary | ICD-10-CM

## 2015-11-13 DIAGNOSIS — R011 Cardiac murmur, unspecified: Secondary | ICD-10-CM | POA: Insufficient documentation

## 2015-11-13 DIAGNOSIS — Y9389 Activity, other specified: Secondary | ICD-10-CM | POA: Diagnosis not present

## 2015-11-13 DIAGNOSIS — F79 Unspecified intellectual disabilities: Secondary | ICD-10-CM | POA: Insufficient documentation

## 2015-11-13 NOTE — Discharge Instructions (Signed)
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
## 2015-11-13 NOTE — ED Notes (Signed)
PA at bedside.
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
## 2016-02-11 ENCOUNTER — Ambulatory Visit (INDEPENDENT_AMBULATORY_CARE_PROVIDER_SITE_OTHER): Payer: Self-pay

## 2016-02-11 ENCOUNTER — Ambulatory Visit (HOSPITAL_COMMUNITY)
Admission: EM | Admit: 2016-02-11 | Discharge: 2016-02-11 | Disposition: A | Discharge status: Home / Self Care | Payer: Medicaid Other | Attending: Emergency Medicine | Admitting: Emergency Medicine

## 2016-02-11 ENCOUNTER — Encounter (HOSPITAL_COMMUNITY): Payer: Self-pay

## 2016-02-11 DIAGNOSIS — S161XXA Strain of muscle, fascia and tendon at neck level, initial encounter: Secondary | ICD-10-CM | POA: Diagnosis not present

## 2016-02-11 MED ORDER — CYCLOBENZAPRINE HCL 10 MG PO TABS: 10.0000 mg | ORAL_TABLET | Freq: Two times a day (BID) | ORAL | 0 refills | Status: DC | PRN

## 2016-02-11 MED ORDER — IBUPROFEN 600 MG PO TABS: 600.0000 mg | ORAL_TABLET | Freq: Four times a day (QID) | ORAL | 0 refills | Status: DC | PRN

## 2016-02-11 NOTE — ED Notes (Signed)
Patient discharged by Frank Patrick, PA 
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
## 2016-04-29 ENCOUNTER — Emergency Department (HOSPITAL_COMMUNITY)
Admission: EM | Admit: 2016-04-29 | Discharge: 2016-05-02 | Disposition: A | Discharge status: Home / Self Care | Payer: Medicaid Other | Attending: Emergency Medicine | Admitting: Emergency Medicine

## 2016-04-29 ENCOUNTER — Encounter (HOSPITAL_COMMUNITY): Payer: Self-pay | Admitting: Emergency Medicine

## 2016-04-29 DIAGNOSIS — F22 Delusional disorders: Secondary | ICD-10-CM | POA: Diagnosis not present

## 2016-04-29 DIAGNOSIS — F29 Unspecified psychosis not due to a substance or known physiological condition: Secondary | ICD-10-CM | POA: Insufficient documentation

## 2016-04-29 DIAGNOSIS — F79 Unspecified intellectual disabilities: Secondary | ICD-10-CM | POA: Diagnosis not present

## 2016-04-29 DIAGNOSIS — Z5181 Encounter for therapeutic drug level monitoring: Secondary | ICD-10-CM | POA: Insufficient documentation

## 2016-04-29 DIAGNOSIS — Z79899 Other long term (current) drug therapy: Secondary | ICD-10-CM | POA: Insufficient documentation

## 2016-04-29 DIAGNOSIS — R45850 Homicidal ideations: Secondary | ICD-10-CM | POA: Diagnosis present

## 2016-04-29 DIAGNOSIS — Z046 Encounter for general psychiatric examination, requested by authority: Secondary | ICD-10-CM | POA: Diagnosis not present

## 2016-04-29 LAB — COMPREHENSIVE METABOLIC PANEL
ALT: 11 U/L — ABNORMAL LOW (ref 14–54)
AST: 14 U/L — AB (ref 15–41)
Albumin: 3.7 g/dL (ref 3.5–5.0)
Alkaline Phosphatase: 65 U/L (ref 38–126)
Anion gap: 6 (ref 5–15)
BUN: 14 mg/dL (ref 6–20)
CHLORIDE: 116 mmol/L — AB (ref 101–111)
CO2: 18 mmol/L — AB (ref 22–32)
Calcium: 8.8 mg/dL — ABNORMAL LOW (ref 8.9–10.3)
Creatinine, Ser: 0.94 mg/dL (ref 0.44–1.00)
Glucose, Bld: 81 mg/dL (ref 65–99)
POTASSIUM: 3.2 mmol/L — AB (ref 3.5–5.1)
SODIUM: 140 mmol/L (ref 135–145)
Total Bilirubin: 0.6 mg/dL (ref 0.3–1.2)
Total Protein: 7.8 g/dL (ref 6.5–8.1)

## 2016-04-29 LAB — CBC
HCT: 35 % — ABNORMAL LOW (ref 36.0–46.0)
Hemoglobin: 12.3 g/dL (ref 12.0–15.0)
MCH: 29.9 pg (ref 26.0–34.0)
MCHC: 35.1 g/dL (ref 30.0–36.0)
MCV: 85.2 fL (ref 78.0–100.0)
PLATELETS: 377 10*3/uL (ref 150–400)
RBC: 4.11 MIL/uL (ref 3.87–5.11)
RDW: 13.8 % (ref 11.5–15.5)
WBC: 5.7 10*3/uL (ref 4.0–10.5)

## 2016-04-29 LAB — RAPID URINE DRUG SCREEN, HOSP PERFORMED
AMPHETAMINES: NOT DETECTED
BENZODIAZEPINES: NOT DETECTED
Barbiturates: NOT DETECTED
Cocaine: NOT DETECTED
OPIATES: NOT DETECTED
Tetrahydrocannabinol: NOT DETECTED

## 2016-04-29 LAB — ETHANOL

## 2016-04-29 LAB — ACETAMINOPHEN LEVEL: Acetaminophen (Tylenol), Serum: <10 ug/mL — ABNORMAL LOW (ref 10–30)

## 2016-04-29 LAB — PREGNANCY, URINE: Preg Test, Ur: NEGATIVE

## 2016-04-29 LAB — SALICYLATE LEVEL

## 2016-04-29 MED ORDER — IBUPROFEN 200 MG PO TABS
600.0000 mg | ORAL_TABLET | Freq: Three times a day (TID) | ORAL | Status: DC | PRN
  Administered 2016-04-30 – 2016-05-02 (×3): 600 mg via ORAL
  Filled 2016-04-29 (×3): qty 3

## 2016-04-29 MED ORDER — ACETAMINOPHEN 325 MG PO TABS
650.0000 mg | ORAL_TABLET | ORAL | Status: DC | PRN
  Administered 2016-04-30 – 2016-05-01 (×3): 650 mg via ORAL
  Filled 2016-04-29 (×3): qty 2

## 2016-04-29 MED ORDER — LORAZEPAM 1 MG PO TABS
1.0000 mg | ORAL_TABLET | Freq: Three times a day (TID) | ORAL | Status: DC | PRN
  Administered 2016-05-01 – 2016-05-02 (×3): 1 mg via ORAL
  Filled 2016-04-29 (×6): qty 1

## 2016-04-29 NOTE — ED Provider Notes (Signed)
[facility]-EMERGENCY DEPT Provider Note   CSN: [id number] Arrival date & time: 04/29/16  1549     History   Chief Complaint Chief Complaint  Patient presents with  . IVC  . Homicidal    HPI RIANN OMAN is a 30 y.o. female.  HPI Patient was brought in under IVC. Patient is without complaints states she has to go to a funeral. Per police the funeral that she wants to go to his for someone that died a long time ago. Per IVC paperwork states that she has been telling people that other people out. Kill her. She's become hostile confrontational pretends to have a gun and threatened to shoot people. States she is a danger to herself and others. Patient denies substance abuse.    Past Medical History:  Diagnosis Date  . Cerebral aneurysm   . Heart murmur   . Mental retardation     Patient Active Problem List   Diagnosis Date Noted  . Involuntary commitment   . Paranoid type delusional disorder (HCC) 04/28/2015  . Mental retardation 04/28/2015    Past Surgical History:  Procedure Laterality Date  . BRAIN SURGERY      OB History    No data available       Home Medications    Prior to Admission medications   Medication Sig Start Date End Date Taking? Authorizing Provider  ciprofloxacin (CIPRO) 500 MG tablet Take 1 tablet (500 mg total) by mouth 2 (two) times daily. 10/02/15   Geoffery Lyons, MD  cyclobenzaprine (FLEXERIL) 10 MG tablet Take 1 tablet (10 mg total) by mouth 2 (two) times daily as needed for muscle spasms. 02/11/16   Tharon Aquas, PA  hydrOXYzine (VISTARIL) 25 MG capsule Take 25 mg by mouth 3 (three) times daily as needed for anxiety.    Historical Provider, MD  ibuprofen (ADVIL,MOTRIN) 600 MG tablet Take 1 tablet (600 mg total) by mouth every 6 (six) hours as needed. 02/11/16   Tharon Aquas, PA  Oxcarbazepine (TRILEPTAL) 300 MG tablet Take 1 tablet (300 mg total) by mouth 2 (two) times daily. 07/17/15   Charm Rings, NP  QUEtiapine (SEROQUEL) 100 MG  tablet Take 1 tablet (100 mg total) by mouth at bedtime. 07/17/15   Charm Rings, NP    Family History History reviewed. No pertinent family history.  Social History Social History  Substance Use Topics  . Smoking status: Never Smoker  . Smokeless tobacco: Never Used  . Alcohol use No     Allergies   Review of patient's allergies indicates no known allergies.   Review of Systems Review of Systems  Unable to perform ROS: Psychiatric disorder  Constitutional: Negative for appetite change.     Physical Exam Updated Vital Signs BP (!) 144/103 (BP Location: Right Arm)   Pulse 92   Temp 98.2 F (36.8 C) (Oral)   Resp 18   SpO2 100%   Physical Exam  Constitutional: She appears well-developed.  HENT:  Head: Atraumatic.  Eyes: EOM are normal.  Cardiovascular: Normal rate.   Pulmonary/Chest: Effort normal.  Abdominal: Soft. There is no tenderness.  Musculoskeletal: She exhibits no edema.  Neurological: She is alert.  Skin: Skin is warm. Capillary refill takes less than 2 seconds.  Psychiatric:  Patient with mildly pressured speech.     ED Treatments / Results  Labs (all labs ordered are listed, but only abnormal results are displayed) Labs Reviewed  COMPREHENSIVE METABOLIC PANEL  ETHANOL  SALICYLATE LEVEL  ACETAMINOPHEN LEVEL  CBC  RAPID URINE DRUG SCREEN, HOSP PERFORMED    EKG  EKG Interpretation None       Radiology No results found.  Procedures Procedures (including critical care time)  Medications Ordered in ED Medications - No data to display   Initial Impression / Assessment and Plan / ED Course  I have reviewed the triage vital signs and the nursing notes.  Pertinent labs & imaging results that were available during my care of the patient were reviewed by me and considered in my medical decision making (see chart for details).  Clinical Course  patietn with psychosis. Psychiatric history . Patient denies IVC complaints. Labs pending  but will likely be medically cleared to see TTS.   Final Clinical Impressions(s) / ED Diagnoses   Final diagnoses:  Psychosis, unspecified psychosis type    New Prescriptions New Prescriptions   No medications on file     Benjiman CoreNathan Talayeh Bruinsma, MD 04/29/16 1648

## 2016-04-29 NOTE — ED Notes (Signed)
Bed: WTR5 Expected date:  Expected time:  Means of arrival:  Comments: 
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
## 2016-04-29 NOTE — ED Notes (Signed)
Pt requesting her "Papers" and states "last time when I was discharged they let me have my IVC papers and all them papers" The RN bringing pt back made pt aware that papers are being placed in the chart, no papers will be given at this moment.

## 2016-04-29 NOTE — ED Triage Notes (Signed)
Patient involuntary committed-PER IVC papers patient is bipolar not taking medications and believes the FBI is taping her.  Patient states she will burn her house down.  STates "certain people are out to stalk and kill her.  THe respondent has become increasingly hostile and confrontational, pretending to have a gun and threatening to shoot people.  In the respondent's present condition she is a danger to herself and to others".

## 2016-04-29 NOTE — ED Notes (Signed)
Hourly round on pt, pt verbalizes "she wants medicine for her hair. TSS in the room conducting interviews.

## 2016-04-30 DIAGNOSIS — F79 Unspecified intellectual disabilities: Secondary | ICD-10-CM | POA: Diagnosis not present

## 2016-04-30 DIAGNOSIS — R45850 Homicidal ideations: Secondary | ICD-10-CM

## 2016-04-30 DIAGNOSIS — Z046 Encounter for general psychiatric examination, requested by authority: Secondary | ICD-10-CM

## 2016-04-30 DIAGNOSIS — Z79899 Other long term (current) drug therapy: Secondary | ICD-10-CM | POA: Diagnosis not present

## 2016-04-30 DIAGNOSIS — F22 Delusional disorders: Secondary | ICD-10-CM | POA: Diagnosis not present

## 2016-04-30 MED ORDER — AMANTADINE HCL 100 MG PO CAPS
100.0000 mg | ORAL_CAPSULE | Freq: Two times a day (BID) | ORAL | Status: DC
  Administered 2016-04-30 – 2016-05-02 (×5): 100 mg via ORAL
  Filled 2016-04-30 (×5): qty 1

## 2016-04-30 MED ORDER — PALIPERIDONE ER 3 MG PO TB24
3.0000 mg | ORAL_TABLET | Freq: Every day | ORAL | Status: DC
  Administered 2016-04-30 – 2016-05-02 (×3): 3 mg via ORAL
  Filled 2016-04-30 (×3): qty 1

## 2016-04-30 MED ORDER — LORAZEPAM 2 MG/ML IJ SOLN
2.0000 mg | Freq: Once | INTRAMUSCULAR | Status: AC
  Administered 2016-04-30: 2 mg via INTRAMUSCULAR
  Filled 2016-04-30: qty 1

## 2016-04-30 MED ORDER — ZIPRASIDONE MESYLATE 20 MG IM SOLR
20.0000 mg | Freq: Once | INTRAMUSCULAR | Status: AC
  Administered 2016-04-30: 20 mg via INTRAMUSCULAR
  Filled 2016-04-30: qty 20

## 2016-04-30 MED ORDER — OXCARBAZEPINE 300 MG PO TABS
300.0000 mg | ORAL_TABLET | Freq: Two times a day (BID) | ORAL | Status: DC
  Administered 2016-04-30 – 2016-05-02 (×5): 300 mg via ORAL
  Filled 2016-04-30 (×5): qty 1

## 2016-04-30 MED ORDER — DIPHENHYDRAMINE HCL 50 MG/ML IJ SOLN
50.0000 mg | Freq: Once | INTRAMUSCULAR | Status: AC
  Administered 2016-04-30: 50 mg via INTRAMUSCULAR
  Filled 2016-04-30: qty 1

## 2016-04-30 MED ORDER — STERILE WATER FOR INJECTION IJ SOLN
INTRAMUSCULAR | Status: AC
  Administered 2016-04-30: 12:00:00
  Filled 2016-04-30: qty 10

## 2016-04-30 NOTE — ED Notes (Addendum)
Patient is anxious. Up and down from the bed , looking in the mirror and pacing in the room.  Patient also asking for pain medication for her HA.

## 2016-04-30 NOTE — ED Notes (Signed)
Patient agitated so security and [employer] on standby while medication administered IM.  Patient did allow nurse to give IM injections without intervention by security.  Patient resting in bed now.

## 2016-04-30 NOTE — Consult Note (Signed)
[facility] Consult   Reason for Consult:  Paranoia, hostility Referring Physician:  EDP Patient Identification: Wendy Berry MRN:  [id number] Principal Diagnosis: Paranoid type delusional disorder [facility]) Diagnosis:   Patient Active Problem List   Diagnosis Date Noted  . Paranoid type delusional disorder ([place]) [F22] 04/28/2015    Priority: High  . Involuntary commitment [Z04.6]   . Mental retardation [F79] 04/28/2015    Total Time spent with patient: 45 minutes  Subjective:   Wendy Berry is a 30 y.o. female patient admitted with delusion and aggressive behavior.  HPI:  Patient with history of Paranoid Schizophrenia, S/P Brain surgery secondary to Aneurysm who was IVC'd by her due to non-compliant with medications, bizarre behavior, aggressive behavior and hostility. Patient is a poor historian but collateral information obtained from her mother indicates that patient has been talking to herself, extremely paranoid, thinks she is being stalked and followed by [facility] and has been pretending to have a gun and threatening to shoot people.  Patient denies drugs and alcohol abuse.  Past Psychiatric History: as above  Risk to Self: Suicidal Ideation: No Suicidal Intent: No Is patient at risk for suicide?: No Suicidal Plan?: No Access to Means: No What has been your use of drugs/alcohol within the last 12 months?: Pt denies How many times?: 0 Other Self Harm Risks: None reported Triggers for Past Attempts: None known Intentional Self Injurious Behavior: None Risk to Others: Homicidal Ideation: No Thoughts of Harm to Others: No Current Homicidal Intent: No Current Homicidal Plan: No Access to Homicidal Means: No Identified Victim: No one History of harm to others?: Yes Assessment of Violence: In distant past Violent Behavior Description: Hitting others or threatening to. Does patient have access to weapons?: No Criminal Charges Pending?: No (Unknown) Does  patient have a court date: No Prior Inpatient Therapy: Prior Inpatient Therapy: Yes Prior Therapy Dates: 2 years ago? Prior Therapy Facilty/Provider(s): [facility]? Reason for Treatment: unknown Prior Outpatient Therapy: Prior Outpatient Therapy: Yes Prior Therapy Dates: Last 6 months Prior Therapy Facilty/Provider(s): [facility] Reason for Treatment: ACTT team Does patient have an ACCT team?: Yes (Pt says she has ACTT team) Does patient have Intensive In-House Services?  : No Does patient have [facility] services? : No Does patient have P4CC services?: No  Past Medical History:  Past Medical History:  Diagnosis Date  . Cerebral aneurysm   . Heart murmur   . Mental retardation     Past Surgical History:  Procedure Laterality Date  . BRAIN SURGERY     Family History: History reviewed. No pertinent family history. Family Psychiatric  History:  Social History:  History  Alcohol Use No     History  Drug Use No    Social History   Social History  . Marital status: Single    Spouse name: N/A  . Number of children: N/A  . Years of education: N/A   Social History Main Topics  . Smoking status: Never Smoker  . Smokeless tobacco: Never Used  . Alcohol use No  . Drug use: No  . Sexual activity: Not Asked   Other Topics Concern  . None   Social History Narrative  . None   Additional Social History:    Allergies:  No Known Allergies  Labs:  Results for orders placed or performed during the hospital encounter of 04/29/16 (from the past 48 hour(s))  Rapid urine drug screen (hospital performed)     Status: None   Collection Time: 04/29/16  4:23 PM  Result Value Ref Range   Opiates NONE DETECTED NONE DETECTED   Cocaine NONE DETECTED NONE DETECTED   Benzodiazepines NONE DETECTED NONE DETECTED   Amphetamines NONE DETECTED NONE DETECTED   Tetrahydrocannabinol NONE DETECTED NONE DETECTED   Barbiturates NONE DETECTED NONE DETECTED    Comment:        DRUG SCREEN  FOR MEDICAL PURPOSES ONLY.  IF CONFIRMATION IS NEEDED FOR ANY PURPOSE, NOTIFY LAB WITHIN 5 DAYS.        LOWEST DETECTABLE LIMITS FOR URINE DRUG SCREEN Drug Class       Cutoff (ng/mL) Amphetamine      1000 Barbiturate      200 Benzodiazepine   032 Tricyclics       122 Opiates          300 Cocaine          300 THC              50   Pregnancy, urine     Status: None   Collection Time: 04/29/16  4:23 PM  Result Value Ref Range   Preg Test, Ur NEGATIVE NEGATIVE    Comment:        THE SENSITIVITY OF THIS METHODOLOGY IS >20 mIU/mL.   Comprehensive metabolic panel     Status: Abnormal   Collection Time: 04/29/16  4:29 PM  Result Value Ref Range   Sodium 140 135 - 145 mmol/L   Potassium 3.2 (L) 3.5 - 5.1 mmol/L   Chloride 116 (H) 101 - 111 mmol/L   CO2 18 (L) 22 - 32 mmol/L   Glucose, Bld 81 65 - 99 mg/dL   BUN 14 6 - 20 mg/dL   Creatinine, Ser 0.94 0.44 - 1.00 mg/dL   Calcium 8.8 (L) 8.9 - 10.3 mg/dL   Total Protein 7.8 6.5 - 8.1 g/dL   Albumin 3.7 3.5 - 5.0 g/dL   AST 14 (L) 15 - 41 U/L   ALT 11 (L) 14 - 54 U/L   Alkaline Phosphatase 65 38 - 126 U/L   Total Bilirubin 0.6 0.3 - 1.2 mg/dL   GFR calc non Af Amer >60 >60 mL/min   GFR calc Af Amer >60 >60 mL/min    Comment: (NOTE) The eGFR has been calculated using the CKD EPI equation. This calculation has not been validated in all clinical situations. eGFR's persistently <60 mL/min signify possible Chronic Kidney Disease.    Anion gap 6 5 - 15  Ethanol     Status: None   Collection Time: 04/29/16  4:29 PM  Result Value Ref Range   Alcohol, Ethyl (B) <5 <5 mg/dL    Comment:        LOWEST DETECTABLE LIMIT FOR SERUM ALCOHOL IS 5 mg/dL FOR MEDICAL PURPOSES ONLY   Salicylate level     Status: None   Collection Time: 04/29/16  4:29 PM  Result Value Ref Range   Salicylate Lvl <4.8 2.8 - 30.0 mg/dL  Acetaminophen level     Status: Abnormal   Collection Time: 04/29/16  4:29 PM  Result Value Ref Range   Acetaminophen  (Tylenol), Serum <10 (L) 10 - 30 ug/mL    Comment:        THERAPEUTIC CONCENTRATIONS VARY SIGNIFICANTLY. A RANGE OF 10-30 ug/mL MAY BE AN EFFECTIVE CONCENTRATION FOR MANY PATIENTS. HOWEVER, SOME ARE BEST TREATED AT CONCENTRATIONS OUTSIDE THIS RANGE. ACETAMINOPHEN CONCENTRATIONS >150 ug/mL AT 4 HOURS AFTER INGESTION AND >50 ug/mL AT 12 HOURS AFTER INGESTION ARE  OFTEN ASSOCIATED WITH TOXIC REACTIONS.   cbc     Status: Abnormal   Collection Time: 04/29/16  4:29 PM  Result Value Ref Range   WBC 5.7 4.0 - 10.5 K/uL   RBC 4.11 3.87 - 5.11 MIL/uL   Hemoglobin 12.3 12.0 - 15.0 g/dL   HCT 35.0 (L) 36.0 - 46.0 %   MCV 85.2 78.0 - 100.0 fL   MCH 29.9 26.0 - 34.0 pg   MCHC 35.1 30.0 - 36.0 g/dL   RDW 13.8 11.5 - 15.5 %   Platelets 377 150 - 400 K/uL    Current Facility-Administered Medications  Medication Dose Route Frequency Provider Last Rate Last Dose  . acetaminophen (TYLENOL) tablet 650 mg  650 mg Oral Q4H PRN Davonna Belling, MD   650 mg at 04/30/16 0930  . amantadine (SYMMETREL) capsule 100 mg  100 mg Oral BID Lief Palmatier, MD      . diphenhydrAMINE (BENADRYL) injection 50 mg  50 mg Intramuscular Once Alaa Eyerman, MD      . ibuprofen (ADVIL,MOTRIN) tablet 600 mg  600 mg Oral Q8H PRN Davonna Belling, MD   600 mg at 04/30/16 0930  . LORazepam (ATIVAN) injection 2 mg  2 mg Intramuscular Once Guy Seese, MD      . LORazepam (ATIVAN) tablet 1 mg  1 mg Oral Q8H PRN Davonna Belling, MD      . Oxcarbazepine (TRILEPTAL) tablet 300 mg  300 mg Oral BID Delta Deshmukh, MD      . paliperidone (INVEGA) 24 hr tablet 3 mg  3 mg Oral Daily Astaria Nanez, MD      . ziprasidone (GEODON) injection 20 mg  20 mg Intramuscular Once Corena Pilgrim, MD       Current Outpatient Prescriptions  Medication Sig Dispense Refill  . Multiple Vitamin (MULTIVITAMIN WITH MINERALS) TABS tablet Take 1 tablet by mouth daily.    . paliperidone (INVEGA SUSTENNA) 234 MG/1.5ML SUSP injection Inject  234 mg into the muscle every 28 (twenty-eight) days.    . polyvinyl alcohol (LIQUIFILM TEARS) 1.4 % ophthalmic solution Place 1 drop into both eyes as needed for dry eyes.    Marland Kitchen topiramate (TOPAMAX) 50 MG tablet Take 50 mg by mouth 3 (three) times daily.  4  . benztropine (COGENTIN) 1 MG tablet Take 1 mg by mouth 2 (two) times daily.    . cyclobenzaprine (FLEXERIL) 10 MG tablet Take 1 tablet (10 mg total) by mouth 2 (two) times daily as needed for muscle spasms. (Patient not taking: Reported on 04/29/2016) 20 tablet 0  . Oxcarbazepine (TRILEPTAL) 300 MG tablet Take 1 tablet (300 mg total) by mouth 2 (two) times daily. (Patient not taking: Reported on 04/29/2016) 60 tablet 0  . QUEtiapine (SEROQUEL) 100 MG tablet Take 1 tablet (100 mg total) by mouth at bedtime. (Patient not taking: Reported on 04/29/2016) 30 tablet 0  . risperiDONE (RISPERDAL) 2 MG tablet Take 6 mg by mouth at bedtime.      Musculoskeletal: Strength & Muscle Tone: within normal limits Gait & Station: normal Patient leans: N/A  Psychiatric Specialty Exam: Physical Exam  Psychiatric: Her mood appears anxious. Her affect is labile. Her speech is rapid and/or pressured and tangential. She is agitated, aggressive and hyperactive. Thought content is paranoid and delusional. Cognition and memory are normal. She expresses impulsivity.    Review of Systems  Constitutional: Negative.   HENT: Negative.   Eyes: Negative.   Respiratory: Negative.   Cardiovascular: Negative.   Gastrointestinal: Negative.  Genitourinary: Negative.   Musculoskeletal: Negative.   Skin: Negative.   Neurological: Negative.   Endo/Heme/Allergies: Negative.   Psychiatric/Behavioral: Positive for hallucinations. The patient is nervous/anxious.     Blood pressure 129/97, pulse 72, temperature 98.3 F (36.8 C), temperature source Oral, resp. rate 18, SpO2 100 %.There is no height or weight on file to calculate BMI.  General Appearance: Casual  Eye  Contact:  Minimal  Speech:  Pressured  Volume:  Increased  Mood:  Angry and Irritable  Affect:  Labile  Thought Process:  Disorganized  Orientation:  Full (Time, Place, and Person)  Thought Content:  Delusions, Hallucinations: Auditory and Paranoid Ideation  Suicidal Thoughts:  No  Homicidal Thoughts:  Yes.  without intent/plan  Memory:  Immediate;   Fair Recent;   Fair Remote;   Fair  Judgement:  Poor  Insight:  Lacking  Psychomotor Activity:  Increased  Concentration:  Concentration: Poor and Attention Span: Poor  Recall:  [employer] of Knowledge:  Fair  Language:  Good  Akathisia:  No  Handed:  Right  AIMS (if indicated):     Assets:  Communication Skills Social Support  ADL's:  Intact  Cognition:  WNL  Sleep:        Treatment Plan Summary: Daily contact with patient to assess and evaluate symptoms and progress in treatment, Medication management. Crisis stabilization Start Trileptal 300 mg bid for mood Start Invega 3 mg daily for delusions Start Amantadine 128m bid for EPS prevention  Disposition: Recommend psychiatric Inpatient admission when medically cleared. Supportive therapy provided about ongoing stressors. Will benefit from inpatient admission for stanilization  ACorena Pilgrim MD 04/30/2016 11:34 AM

## 2016-04-30 NOTE — ED Notes (Signed)
Patient becoming increasingly agitated.  Up to use the phone again talking with her Mother saying she wants to go home.  Nurse attempted to give ativan 1mg  per prn order but patient said,"is this for my brain tumor or hair?"  I'm not going to take it.
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
## 2016-04-30 NOTE — ED Notes (Signed)
Unable to give patient's 12:00 medications because patient would not wake up enough to take them.  Vital signs stable, respirations even unlabored.  Patient resting from IM medications just given recently.  See Notes.

## 2016-04-30 NOTE — ED Notes (Signed)
Patient became upset after speaking with psychiatrist and nurse practitioner.  Patient up to nurses's desk using phone talking to her mother.  "I just want to go home."  Obtained phone number for patient's mother she calls Rosey Batheresa or just T.  It is [phone].

## 2016-05-01 MED ORDER — POLYETHYLENE GLYCOL 3350 17 G PO PACK
17.0000 g | PACK | Freq: Every day | ORAL | Status: DC | PRN
  Administered 2016-05-01: 17 g via ORAL
  Filled 2016-05-01: qty 1

## 2016-05-01 NOTE — Progress Notes (Signed)
Annell GreeningJonathan Bass with [facility] [phone]) called about patient referral.  Wanting to discuss patient and determine the most appropriate placement, so needing additional information about patient's intellectual level with supporting documentation. Informed Mr. Donavan FoilBass that I would include a note in chart and make sure social worker and physicians are aware.  York SpanielSaid he would keep her file open until he hears back from Child psychotherapistsocial worker about placement.

## 2016-05-01 NOTE — Progress Notes (Signed)
Disposition CSW completed patient referrals to the following inpatient psych facilities:  [facility]  Patient will most likely need a [facility] referral.  Seward SpeckLeo Yovani [facility] IncCSW,LCAS Behavioral Health Disposition CSW [phone]
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
## 2016-05-01 NOTE — ED Notes (Signed)
Patient requesting ativan. 

## 2016-05-02 DIAGNOSIS — Z79899 Other long term (current) drug therapy: Secondary | ICD-10-CM | POA: Diagnosis not present

## 2016-05-02 DIAGNOSIS — F22 Delusional disorders: Secondary | ICD-10-CM

## 2016-05-02 MED ORDER — OXCARBAZEPINE 300 MG PO TABS: 300.0000 mg | ORAL_TABLET | Freq: Two times a day (BID) | ORAL | 0 refills | Status: DC

## 2016-05-02 MED ORDER — WHITE PETROLATUM GEL
Status: AC
  Filled 2016-05-02: qty 5

## 2016-05-02 MED ORDER — PALIPERIDONE ER 3 MG PO TB24: 3.0000 mg | ORAL_TABLET | Freq: Every day | ORAL | 0 refills | Status: DC

## 2016-05-02 MED ORDER — AMANTADINE HCL 100 MG PO CAPS: 100.0000 mg | ORAL_CAPSULE | Freq: Two times a day (BID) | ORAL | 0 refills | Status: DC

## 2016-05-02 MED ORDER — LORAZEPAM 1 MG PO TABS: 1.0000 mg | ORAL_TABLET | Freq: Three times a day (TID) | ORAL | 0 refills | Status: DC | PRN

## 2016-05-02 NOTE — Consult Note (Signed)
[facility] Face-to-Face Psychiatry Consult   Reason for Consult:  Paranoia  Referring Physician:  EDP Patient Identification: Wendy Berry MRN:  [id number] Principal Diagnosis: Paranoid type delusional disorder [facility]) Diagnosis:   Patient Active Problem List   Diagnosis Date Noted  . 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 Mental retardation [F79] 04/28/2015    Priority: High  . Involuntary commitment [Z04.6]     Total Time spent with patient: 30 minutes  Subjective:   Wendy Berry is a 30 y.o. female patient does not warrant admission.  HPI:  30 yo female who was brought to the ED for paranoia and agitation.  She was started on medications and stabilized, at her baseline.  No suicidal/homicidal ideations, hallucinations, or alcohol/drug abuse.  She is delusional at times but not treatable, minimal at this time.  Stable for discharge.  Past Psychiatric History: paranoia, delusions  Risk to Self: Suicidal Ideation: No Suicidal Intent: No Is patient at risk for suicide?: No Suicidal Plan?: No Access to Means: No What has been your use of drugs/alcohol within the last 12 months?: Pt denies How many times?: 0 Other Self Harm Risks: None reported Triggers for Past Attempts: None known Intentional Self Injurious Behavior: None Risk to Others: Homicidal Ideation: No Thoughts of Harm to Others: No Current Homicidal Intent: No Current Homicidal Plan: No Access to Homicidal Means: No Identified Victim: No one History of harm to others?: Yes Assessment of Violence: In distant past Violent Behavior Description: Hitting others or threatening to. Does patient have access to weapons?: No Criminal Charges Pending?: No (Unknown) Does patient have a court date: No Prior Inpatient Therapy: Prior Inpatient Therapy: Yes Prior Therapy Dates: 2 years ago? Prior Therapy Facilty/Provider(s): [facility]? Reason for Treatment: unknown Prior Outpatient Therapy:  Prior Outpatient Therapy: Yes Prior Therapy Dates: Last 6 months Prior Therapy Facilty/Provider(s): [facility] Reason for Treatment: ACTT team Does patient have an ACCT team?: Yes (Pt says she has ACTT team) Does patient have Intensive In-House Services?  : No Does patient have [facility] services? : No Does patient have P4CC services?: No  Past Medical History:  Past Medical History:  Diagnosis Date  . Cerebral aneurysm   . Heart murmur   . Mental retardation     Past Surgical History:  Procedure Laterality Date  . BRAIN SURGERY     Family History: History reviewed. No pertinent family history. Family Psychiatric  History: none Social History:  History  Alcohol Use No     History  Drug Use No    Social History   Social History  . Marital status: Single    Spouse name: N/A  . Number of children: N/A  . Years of education: N/A   Social History Main Topics  . Smoking status: Never Smoker  . Smokeless tobacco: Never Used  . Alcohol use No  . Drug use: No  . Sexual activity: Not Asked   Other Topics Concern  . None   Social History Narrative  . None   Additional Social History:    Allergies:  No Known Allergies  Labs: No results found for this or any previous visit (from the past 48 hour(s)).  Current Facility-Administered Medications  Medication Dose Route Frequency Provider Last Rate Last Dose  . acetaminophen (TYLENOL) tablet 650 mg  650 mg Oral Q4H PRN Benjiman Core, MD   650 mg at 05/01/16 [id number]  . amantadine (SYMMETREL) capsule 100 mg  100 mg Oral BID  Thedore MinsMojeed Aryelle Figg, MD   100 mg at 05/02/16 0907  . ibuprofen (ADVIL,MOTRIN) tablet 600 mg  600 mg Oral Q8H PRN Benjiman CoreNathan Pickering, MD   600 mg at 05/02/16 0907  . LORazepam (ATIVAN) tablet 1 mg  1 mg Oral Q8H PRN Benjiman CoreNathan Pickering, MD   1 mg at 05/02/16 0907  . Oxcarbazepine (TRILEPTAL) tablet 300 mg  300 mg Oral BID Thedore MinsMojeed Damya Comley, MD   300 mg at 05/02/16 0907  . paliperidone (INVEGA) 24 hr  tablet 3 mg  3 mg Oral Daily Thedore MinsMojeed Lazlo Tunney, MD   3 mg at 05/02/16 0907  . polyethylene glycol (MIRALAX / GLYCOLAX) packet 17 g  17 g Oral Daily PRN Rolland PorterMark James, MD   17 g at 05/01/16 2056  . white petrolatum (VASELINE) gel           . white petrolatum (VASELINE) gel            Current Outpatient Prescriptions  Medication Sig Dispense Refill  . Multiple Vitamin (MULTIVITAMIN WITH MINERALS) TABS tablet Take 1 tablet by mouth daily.    . paliperidone (INVEGA SUSTENNA) 234 MG/1.5ML SUSP injection Inject 234 mg into the muscle every 28 (twenty-eight) days.    . polyvinyl alcohol (LIQUIFILM TEARS) 1.4 % ophthalmic solution Place 1 drop into both eyes as needed for dry eyes.    Marland Kitchen. topiramate (TOPAMAX) 50 MG tablet Take 50 mg by mouth 3 (three) times daily.  4  . benztropine (COGENTIN) 1 MG tablet Take 1 mg by mouth 2 (two) times daily.    . cyclobenzaprine (FLEXERIL) 10 MG tablet Take 1 tablet (10 mg total) by mouth 2 (two) times daily as needed for muscle spasms. (Patient not taking: Reported on 04/29/2016) 20 tablet 0  . Oxcarbazepine (TRILEPTAL) 300 MG tablet Take 1 tablet (300 mg total) by mouth 2 (two) times daily. (Patient not taking: Reported on 04/29/2016) 60 tablet 0  . QUEtiapine (SEROQUEL) 100 MG tablet Take 1 tablet (100 mg total) by mouth at bedtime. (Patient not taking: Reported on 04/29/2016) 30 tablet 0  . risperiDONE (RISPERDAL) 2 MG tablet Take 6 mg by mouth at bedtime.      Musculoskeletal: Strength & Muscle Tone: within normal limits Gait & Station: normal Patient leans: N/A  Psychiatric Specialty Exam: Physical Exam  Constitutional: She is oriented to person, place, and time. She appears well-developed and well-nourished.  HENT:  Head: Normocephalic.  Neck: Normal range of motion.  Respiratory: Effort normal.  Musculoskeletal: Normal range of motion.  Neurological: She is alert and oriented to person, place, and time.  Skin: Skin is warm and dry.  Psychiatric: She has a  normal mood and affect. Her speech is normal and behavior is normal. Judgment and thought content normal. Cognition and memory are normal.    ROS  Blood pressure (!) 149/104, pulse 94, temperature 98 F (36.7 C), temperature source Oral, resp. rate 18, SpO2 100 %.There is no height or weight on file to calculate BMI.  General Appearance: Casual  Eye Contact:  Good  Speech:  Normal Rate  Volume:  Normal  Mood:  Euthymic  Affect:  Congruent  Thought Process:  Coherent and Descriptions of Associations: Intact  Orientation:  Full (Time, Place, and Person)  Thought Content:  Delusions, minimial  Suicidal Thoughts:  No  Homicidal Thoughts:  No  Memory:  Immediate;   Fair Recent;   Fair Remote;   Fair  Judgement:  Fair  Insight:  Fair  Psychomotor Activity:  Normal  Concentration:  Concentration: Good and Attention Span: Good  Recall:  Good  Fund of Knowledge:  Fair  Language:  Good  Akathisia:  No  Handed:  Right  AIMS (if indicated):     Assets:  Housing Leisure Time Resilience Social Support  ADL's:  Intact  Cognition:  Impaired,  Mild  Sleep:        Treatment Plan Summary: Daily contact with patient to assess and evaluate symptoms and progress in treatment, Medication management and Plan paranoid delusional type:  -Crisis stabilization -Medication management:  Continue Symmetrel 100 mg BID for EPS, Ativan every 8 hours PRN anxiety, Trileptal 300 mg BID for mood stabilization, and Invega 3 mg daily for delusions -Individual counseling -Rx provided  Disposition: No evidence of imminent risk to self or others at present.    Nanine MeansLORD, JAMISON, NP 05/02/2016 9:37 AM  Patient seen face-to-face for psychiatric evaluation, chart reviewed and case discussed with the physician extender and developed treatment plan. Reviewed the information documented and agree with the treatment plan. Thedore MinsMojeed Supreme Rybarczyk, MD

## 2016-05-02 NOTE — Progress Notes (Signed)
Pt has had an excellent night and is hoping to go home with her mother today.  The mother spoke with nurse yesterday and stated she also wishes for her to come home. Pt has bathed this morning and asking appropriate questions about events of the day. Both the pt and the pt's mother are under the impression that she will be discharged home today, they have not been told this. Will report off to next RN the events of the shift. Jannett Schmall P Baran Kuhrt

## 2016-05-02 NOTE — [facility] Suicide Risk Assessment (Signed)
Suicide Risk Assessment  Discharge Assessment   [facility]   Principal Problem: Paranoid type delusional disorder [facility](HCC) Discharge Diagnoses:  Patient Active Problem List   Diagnosis Date Noted  . 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 Mental retardation [F79] 04/28/2015    Priority: High  . Involuntary commitment [Z04.6]     Total Time spent with patient: 30 minutes  Musculoskeletal: Strength & Muscle Tone: within normal limits Gait & Station: normal Patient leans: N/A  Psychiatric Specialty Exam: Physical Exam  Constitutional: She is oriented to person, place, and time. She appears well-developed and well-nourished.  HENT:  Head: Normocephalic.  Neck: Normal range of motion.  Respiratory: Effort normal.  Musculoskeletal: Normal range of motion.  Neurological: She is alert and oriented to person, place, and time.  Skin: Skin is warm and dry.  Psychiatric: She has a normal mood and affect. Her speech is normal and behavior is normal. Judgment and thought content normal. Cognition and memory are normal.    ROS  Blood pressure (!) 149/104, pulse 94, temperature 98 F (36.7 C), temperature source Oral, resp. rate 18, SpO2 100 %.There is no height or weight on file to calculate BMI.  General Appearance: Casual  Eye Contact:  Good  Speech:  Normal Rate  Volume:  Normal  Mood:  Euthymic  Affect:  Congruent  Thought Process:  Coherent and Descriptions of Associations: Intact  Orientation:  Full (Time, Place, and Person)  Thought Content:  Delusions, minimial  Suicidal Thoughts:  No  Homicidal Thoughts:  No  Memory:  Immediate;   Fair Recent;   Fair Remote;   Fair  Judgement:  Fair  Insight:  Fair  Psychomotor Activity:  Normal  Concentration:  Concentration: Good and Attention Span: Good  Recall:  Good  Fund of Knowledge:  Fair  Language:  Good  Akathisia:  No  Handed:  Right  AIMS (if indicated):      Assets:  Housing Leisure Time Resilience Social Support  ADL's:  Intact  Cognition:  Impaired,  Mild  Sleep:      Mental Status Per Nursing Assessment::   On Admission:   paranoid, delusional  Demographic Factors:  Adolescent or young adult  Loss Factors: NA  Historical Factors: NA  Risk Reduction Factors:   Sense of responsibility to family, Living with another person, especially a relative, Positive social support and Positive therapeutic relationship  Continued Clinical Symptoms:  Delusions, minimal  Cognitive Features That Contribute To Risk:  None    Suicide Risk:  Minimal: No identifiable suicidal ideation.  Patients presenting with no risk factors but with morbid ruminations; may be classified as minimal risk based on the severity of the depressive symptoms    Plan Of Care/Follow-up recommendations:  Activity:  as tolerated Diet:  heart healthy diet  LORD, JAMISON, NP 05/02/2016, 9:45 AM

## 2016-05-24 ENCOUNTER — Ambulatory Visit (HOSPITAL_COMMUNITY)
Admission: EM | Admit: 2016-05-24 | Discharge: 2016-05-24 | Disposition: A | Discharge status: Home / Self Care | Payer: Medicaid Other | Attending: Family Medicine | Admitting: Family Medicine

## 2016-05-24 ENCOUNTER — Encounter (HOSPITAL_COMMUNITY): Payer: Self-pay | Admitting: Family Medicine

## 2016-05-24 DIAGNOSIS — L659 Nonscarring hair loss, unspecified: Secondary | ICD-10-CM

## 2016-05-24 DIAGNOSIS — J301 Allergic rhinitis due to pollen: Secondary | ICD-10-CM | POA: Diagnosis not present

## 2016-05-24 DIAGNOSIS — L819 Disorder of pigmentation, unspecified: Secondary | ICD-10-CM | POA: Diagnosis not present

## 2016-05-24 MED ORDER — LEVOCETIRIZINE DIHYDROCHLORIDE 5 MG PO TABS: 5.0000 mg | ORAL_TABLET | Freq: Every evening | ORAL | 0 refills | Status: DC

## 2016-05-24 MED ORDER — HYDROCORTISONE 1 % EX CREA: TOPICAL_CREAM | CUTANEOUS | 0 refills | Status: DC

## 2016-05-24 MED ORDER — POLYMYXIN B-TRIMETHOPRIM 10000-0.1 UNIT/ML-% OP SOLN: 2.0000 [drp] | OPHTHALMIC | 0 refills | Status: DC

## 2016-05-24 MED ORDER — FLUTICASONE PROPIONATE 50 MCG/ACT NA SUSP: 2.0000 | Freq: Every day | NASAL | 2 refills | Status: DC

## 2016-05-24 MED ORDER — HYDROCORTISONE 2.5 % EX LOTN: TOPICAL_LOTION | Freq: Two times a day (BID) | CUTANEOUS | 0 refills | Status: DC

## 2016-05-24 NOTE — ED Provider Notes (Signed)
CSN: [id number]     Arrival date & time 05/24/16  1336 History   None    Chief Complaint  Patient presents with  . Alopecia  . Allergic Reaction   (Consider location/radiation/quality/duration/timing/severity/associated sxs/prior Treatment) Patient is here for c/o bilateral eye swelling and bilateral peri orbital hyper pigmentation and alopecia.     The history is provided by the patient.  Allergic Reaction  Severity:  Moderate Duration:  1 week Prior allergic episodes:  Seasonal allergies Relieved by:  Nothing Worsened by:  Nothing Ineffective treatments:  None tried   Past Medical History:  Diagnosis Date  . Cerebral aneurysm   . Heart murmur   . Mental retardation    Past Surgical History:  Procedure Laterality Date  . BRAIN SURGERY     History reviewed. No pertinent family history. Social History  Substance Use Topics  . Smoking status: Never Smoker  . Smokeless tobacco: Never Used  . Alcohol use No   OB History    No data available     Review of Systems  Constitutional: Negative.   HENT: Positive for postnasal drip and sneezing.   Eyes: Negative.   Respiratory: Negative.   Cardiovascular: Negative.   Gastrointestinal: Negative.   Endocrine: Negative.   Genitourinary: Negative.   Musculoskeletal: Negative.   Skin: Negative.   Allergic/Immunologic: Negative.   Neurological: Negative.   Hematological: Negative.   Psychiatric/Behavioral: Negative.     Allergies  Patient has no known allergies.  Home Medications   Prior to Admission medications   Medication Sig Start Date End Date Taking? Authorizing Provider  amantadine (SYMMETREL) 100 MG capsule Take 1 capsule (100 mg total) by mouth 2 (two) times daily. 05/02/16   Charm RingsJamison Y Lord, NP  benztropine (COGENTIN) 1 MG tablet Take 1 mg by mouth 2 (two) times daily.    Historical Provider, MD  cyclobenzaprine (FLEXERIL) 10 MG tablet Take 1 tablet (10 mg total) by mouth 2 (two) times daily as needed for  muscle spasms. Patient not taking: Reported on 04/29/2016 02/11/16   Tharon AquasFrank C Patrick, PA  LORazepam (ATIVAN) 1 MG tablet Take 1 tablet (1 mg total) by mouth every 8 (eight) hours as needed for anxiety (agitation). 05/02/16   Charm RingsJamison Y Lord, NP  Multiple Vitamin (MULTIVITAMIN WITH MINERALS) TABS tablet Take 1 tablet by mouth daily.    Historical Provider, MD  Oxcarbazepine (TRILEPTAL) 300 MG tablet Take 1 tablet (300 mg total) by mouth 2 (two) times daily. 05/02/16   Charm RingsJamison Y Lord, NP  paliperidone (INVEGA SUSTENNA) 234 MG/1.5ML SUSP injection Inject 234 mg into the muscle every 28 (twenty-eight) days.    Historical Provider, MD  paliperidone (INVEGA) 3 MG 24 hr tablet Take 1 tablet (3 mg total) by mouth daily. 05/03/16   Charm RingsJamison Y Lord, NP  polyvinyl alcohol (LIQUIFILM TEARS) 1.4 % ophthalmic solution Place 1 drop into both eyes as needed for dry eyes.    Historical Provider, MD  QUEtiapine (SEROQUEL) 100 MG tablet Take 1 tablet (100 mg total) by mouth at bedtime. Patient not taking: Reported on 04/29/2016 07/17/15   Charm RingsJamison Y Lord, NP  risperiDONE (RISPERDAL) 2 MG tablet Take 6 mg by mouth at bedtime.    Historical Provider, MD  topiramate (TOPAMAX) 50 MG tablet Take 50 mg by mouth 3 (three) times daily. 04/20/16   Historical Provider, MD   Meds Ordered and Administered this Visit  Medications - No data to display  BP 123/73   Pulse 92   Temp 98.4  F (36.9 C)   Resp 18   SpO2 100%  No data found.   Physical Exam  Constitutional: She is oriented to person, place, and time. She appears well-developed and well-nourished.  HENT:  Head: Normocephalic and atraumatic.  Right Ear: External ear normal.  Left Ear: External ear normal.  Mouth/Throat: Oropharynx is clear and moist.  Eyes: EOM are normal. Pupils are equal, round, and reactive to light.  Neck: Normal range of motion.  Cardiovascular: Normal rate, regular rhythm and normal heart sounds.   Pulmonary/Chest: Effort normal and breath  sounds normal.  Abdominal: Soft. Bowel sounds are normal.  Neurological: She is alert and oriented to person, place, and time.  Nursing note and vitals reviewed.   Urgent Care Course   Clinical Course     Procedures (including critical care time)  Labs Review Labs Reviewed - No data to display  Imaging Review No results found.   Visual Acuity Review  Right Eye Distance:   Left Eye Distance:   Bilateral Distance:    Right Eye Near:   Left Eye Near:    Bilateral Near:         MDM  Seasonal Allergies - Xyzal 5mg  one po qd #30 Hyperpigmentation of skin - apply hydrocortisone cream to affected area bid Alopecia - Derm referral made for alopecia and for hyperpigmentation of skin.    Deatra CanterWilliam J Haydn Cush, FNP 05/24/16 1411    Deatra CanterWilliam J Skarlette Lattner, FNP 05/24/16 [phone]

## 2016-05-24 NOTE — ED Triage Notes (Signed)
Pt here for allergic reaction to hair product.

## 2016-06-02 ENCOUNTER — Encounter (HOSPITAL_COMMUNITY): Payer: Self-pay | Admitting: Family Medicine

## 2016-06-02 ENCOUNTER — Ambulatory Visit (HOSPITAL_COMMUNITY)
Admission: EM | Admit: 2016-06-02 | Discharge: 2016-06-02 | Disposition: A | Discharge status: Home / Self Care | Payer: Medicaid Other | Attending: Family Medicine | Admitting: Family Medicine

## 2016-06-02 DIAGNOSIS — I10 Essential (primary) hypertension: Secondary | ICD-10-CM

## 2016-06-02 DIAGNOSIS — K529 Noninfective gastroenteritis and colitis, unspecified: Secondary | ICD-10-CM | POA: Diagnosis not present

## 2016-06-02 MED ORDER — TRIAMTERENE-HCTZ 37.5-25 MG PO TABS: 1.0000 | ORAL_TABLET | Freq: Every day | ORAL | 11 refills | Status: DC

## 2016-06-02 MED ORDER — ONDANSETRON 8 MG PO TBDP: 8.0000 mg | ORAL_TABLET | Freq: Three times a day (TID) | ORAL | 0 refills | Status: DC | PRN

## 2016-06-02 NOTE — ED Provider Notes (Signed)
[facility]    CSN: [id number] Arrival date & time: 06/02/16  1003     History   Chief Complaint Chief Complaint  Patient presents with  . Night Sweats  . Chills    HPI Wendy Berry is a 30 y.o. female.   30 year old woman who believes she ate some bad food at a restaurant recently and doesn't feel well. She's had no fever, sore throat, cough, nausea, vomiting, or diarrhea.  Patient lives with her mother. She had been working at [employer] but recently quit. She feels that people playing mind games with her. She is not taking any of her psychiatry medicine. She refuses a referral to a primary care doctor or psychiatrist.      Past Medical History:  Diagnosis Date  . Cerebral aneurysm   . Heart murmur   . Mental retardation     Patient Active Problem List   Diagnosis Date Noted  . Involuntary commitment   . Paranoid type delusional disorder (HCC) 04/28/2015  . Mental retardation 04/28/2015    Past Surgical History:  Procedure Laterality Date  . BRAIN SURGERY      OB History    No data available       Home Medications    Prior to Admission medications   Medication Sig Start Date End Date Taking? Authorizing Provider  amantadine (SYMMETREL) 100 MG capsule Take 1 capsule (100 mg total) by mouth 2 (two) times daily. 05/02/16   Charm RingsJamison Y Lord, NP  benztropine (COGENTIN) 1 MG tablet Take 1 mg by mouth 2 (two) times daily.    Historical Provider, MD  fluticasone (FLONASE) 50 MCG/ACT nasal spray Place 2 sprays into both nostrils daily. 05/24/16   Deatra CanterWilliam J Oxford, FNP  hydrocortisone 2.5 % lotion Apply topically 2 (two) times daily. 05/24/16   Deatra CanterWilliam J Oxford, FNP  hydrocortisone cream 1 % Apply to affected area 2 times daily 05/24/16   Deatra CanterWilliam J Oxford, FNP  levocetirizine (XYZAL) 5 MG tablet Take 1 tablet (5 mg total) by mouth every evening. 05/24/16   Deatra CanterWilliam J Oxford, FNP  LORazepam (ATIVAN) 1 MG tablet Take 1 tablet (1 mg total) by mouth  every 8 (eight) hours as needed for anxiety (agitation). 05/02/16   Charm RingsJamison Y Lord, NP  Multiple Vitamin (MULTIVITAMIN WITH MINERALS) TABS tablet Take 1 tablet by mouth daily.    Historical Provider, MD  ondansetron (ZOFRAN-ODT) 8 MG disintegrating tablet Take 1 tablet (8 mg total) by mouth every 8 (eight) hours as needed for nausea. 06/02/16   Elvina SidleKurt Valli Randol, MD  Oxcarbazepine (TRILEPTAL) 300 MG tablet Take 1 tablet (300 mg total) by mouth 2 (two) times daily. 05/02/16   Charm RingsJamison Y Lord, NP  paliperidone (INVEGA SUSTENNA) 234 MG/1.5ML SUSP injection Inject 234 mg into the muscle every 28 (twenty-eight) days.    Historical Provider, MD  paliperidone (INVEGA) 3 MG 24 hr tablet Take 1 tablet (3 mg total) by mouth daily. 05/03/16   Charm RingsJamison Y Lord, NP  polyvinyl alcohol (LIQUIFILM TEARS) 1.4 % ophthalmic solution Place 1 drop into both eyes as needed for dry eyes.    Historical Provider, MD  risperiDONE (RISPERDAL) 2 MG tablet Take 6 mg by mouth at bedtime.    Historical Provider, MD  topiramate (TOPAMAX) 50 MG tablet Take 50 mg by mouth 3 (three) times daily. 04/20/16   Historical Provider, MD  triamterene-hydrochlorothiazide (MAXZIDE-25) 37.5-25 MG tablet Take 1 tablet by mouth daily. 06/02/16   Elvina SidleKurt Cayde Held, MD  trimethoprim-polymyxin b (  POLYTRIM) ophthalmic solution Place 2 drops into both eyes every 4 (four) hours. 05/24/16   Deatra CanterWilliam J Oxford, FNP    Family History No family history on file.  Social History Social History  Substance Use Topics  . Smoking status: Never Smoker  . Smokeless tobacco: Never Used  . Alcohol use No     Allergies   Patient has no known allergies.   Review of Systems Review of Systems  Constitutional: Negative.   HENT: Negative.   Eyes: Negative.   Respiratory: Negative.   Cardiovascular: Negative.   Gastrointestinal: Positive for nausea.  Genitourinary: Negative.   Musculoskeletal: Negative.   Neurological: Negative.      Physical Exam Triage  Vital Signs ED Triage Vitals  Enc Vitals Group     BP      Pulse      Resp      Temp      Temp src      SpO2      Weight      Height      Head Circumference      Peak Flow      Pain Score      Pain Loc      Pain Edu?      Excl. in GC?    No data found.   Updated Vital Signs BP (!) 145/106 (BP Location: Right Arm)   Pulse 81   Temp 98.5 F (36.9 C) (Oral)   SpO2 100%      Physical Exam  Constitutional: She is oriented to person, place, and time. She appears well-developed and well-nourished.  HENT:  Head: Normocephalic.  Right Ear: External ear normal.  Left Ear: External ear normal.  Mouth/Throat: Oropharynx is clear and moist.  Eyes: Conjunctivae and EOM are normal.  Neck: Normal range of motion. Neck supple.  Cardiovascular: Normal rate, regular rhythm and normal heart sounds.   Pulmonary/Chest: Effort normal and breath sounds normal.  Musculoskeletal: Normal range of motion.  Neurological: She is alert and oriented to person, place, and time.  Skin: Skin is warm and dry.  Psychiatric:  Patient is calm but very suspicious.  Nursing note and vitals reviewed.    UC Treatments / Results  Labs (all labs ordered are listed, but only abnormal results are displayed) Labs Reviewed - No data to display  EKG  EKG Interpretation None       Radiology No results found.  Procedures Procedures (including critical care time)  Medications Ordered in UC Medications - No data to display   Initial Impression / Assessment and Plan / UC Course  I have reviewed the triage vital signs and the nursing notes.  Pertinent labs & imaging results that were available during my care of the patient were reviewed by me and considered in my medical decision making (see chart for details).  Clinical Course     Final Clinical Impressions(s) / UC Diagnoses   Final diagnoses:  Gastroenteritis  Essential hypertension    New Prescriptions New Prescriptions    ONDANSETRON (ZOFRAN-ODT) 8 MG DISINTEGRATING TABLET    Take 1 tablet (8 mg total) by mouth every 8 (eight) hours as needed for nausea.   TRIAMTERENE-HYDROCHLOROTHIAZIDE (MAXZIDE-25) 37.5-25 MG TABLET    Take 1 tablet by mouth daily.     Elvina SidleKurt Breean Nannini, MD 06/02/16 1034

## 2016-06-02 NOTE — ED Triage Notes (Signed)
Pt states she has been having night sweats and chills.  She wanted to get a flu shot, but was informed we don't do that here.  Pt had flight of thought, moving from one subject to another.  I was unable to fully determine why she wants to be seen today.

## 2017-05-09 ENCOUNTER — Other Ambulatory Visit: Payer: Self-pay

## 2017-05-09 ENCOUNTER — Encounter (HOSPITAL_COMMUNITY): Payer: Self-pay | Admitting: Emergency Medicine

## 2017-05-09 ENCOUNTER — Emergency Department (HOSPITAL_COMMUNITY)
Admission: EM | Admit: 2017-05-09 | Discharge: 2017-05-16 | Disposition: A | Discharge status: Home / Self Care | Payer: Medicare Other | Attending: Emergency Medicine | Admitting: Emergency Medicine

## 2017-05-09 DIAGNOSIS — R441 Visual hallucinations: Secondary | ICD-10-CM | POA: Insufficient documentation

## 2017-05-09 DIAGNOSIS — Z79899 Other long term (current) drug therapy: Secondary | ICD-10-CM | POA: Diagnosis not present

## 2017-05-09 DIAGNOSIS — F22 Delusional disorders: Secondary | ICD-10-CM | POA: Diagnosis not present

## 2017-05-09 DIAGNOSIS — F79 Unspecified intellectual disabilities: Secondary | ICD-10-CM | POA: Insufficient documentation

## 2017-05-09 DIAGNOSIS — Z046 Encounter for general psychiatric examination, requested by authority: Secondary | ICD-10-CM | POA: Diagnosis not present

## 2017-05-09 DIAGNOSIS — R443 Hallucinations, unspecified: Secondary | ICD-10-CM | POA: Diagnosis not present

## 2017-05-09 DIAGNOSIS — R4587 Impulsiveness: Secondary | ICD-10-CM | POA: Diagnosis not present

## 2017-05-09 LAB — RAPID URINE DRUG SCREEN, HOSP PERFORMED
Amphetamines: NOT DETECTED
BARBITURATES: NOT DETECTED
Benzodiazepines: NOT DETECTED
Cocaine: NOT DETECTED
Opiates: NOT DETECTED
Tetrahydrocannabinol: NOT DETECTED

## 2017-05-09 LAB — COMPREHENSIVE METABOLIC PANEL
ALBUMIN: 4.1 g/dL (ref 3.5–5.0)
ALK PHOS: 76 U/L (ref 38–126)
ALT: 18 U/L (ref 14–54)
ANION GAP: 13 (ref 5–15)
AST: 25 U/L (ref 15–41)
BILIRUBIN TOTAL: 0.6 mg/dL (ref 0.3–1.2)
BUN: 18 mg/dL (ref 6–20)
CALCIUM: 9.6 mg/dL (ref 8.9–10.3)
CO2: 23 mmol/L (ref 22–32)
Chloride: 104 mmol/L (ref 101–111)
Creatinine, Ser: 1.09 mg/dL — ABNORMAL HIGH (ref 0.44–1.00)
GFR calc Af Amer: >60 mL/min (ref >60)
GFR calc non Af Amer: >60 mL/min (ref >60)
GLUCOSE: 114 mg/dL — AB (ref 65–99)
POTASSIUM: 3.3 mmol/L — AB (ref 3.5–5.1)
SODIUM: 140 mmol/L (ref 135–145)
TOTAL PROTEIN: 9.1 g/dL — AB (ref 6.5–8.1)

## 2017-05-09 LAB — CBC
HEMATOCRIT: 37.3 % (ref 36.0–46.0)
HEMOGLOBIN: 12.8 g/dL (ref 12.0–15.0)
MCH: 30.8 pg (ref 26.0–34.0)
MCHC: 34.3 g/dL (ref 30.0–36.0)
MCV: 89.7 fL (ref 78.0–100.0)
Platelets: 540 10*3/uL — ABNORMAL HIGH (ref 150–400)
RBC: 4.16 MIL/uL (ref 3.87–5.11)
RDW: 14.3 % (ref 11.5–15.5)
WBC: 7.1 10*3/uL (ref 4.0–10.5)

## 2017-05-09 LAB — TROPONIN I

## 2017-05-09 LAB — ACETAMINOPHEN LEVEL

## 2017-05-09 LAB — SALICYLATE LEVEL: Salicylate Lvl: <7 mg/dL (ref 2.8–30.0)

## 2017-05-09 LAB — HCG, QUANTITATIVE, PREGNANCY: hCG, Beta Chain, Quant, S: 1 m[IU]/mL (ref <5)

## 2017-05-09 LAB — ETHANOL: Alcohol, Ethyl (B): <10 mg/dL (ref <10)

## 2017-05-09 MED ORDER — TRIAMTERENE-HCTZ 37.5-25 MG PO TABS
1.0000 | ORAL_TABLET | Freq: Every day | ORAL | Status: DC
  Administered 2017-05-14 – 2017-05-16 (×3): 1 via ORAL
  Filled 2017-05-09 (×8): qty 1

## 2017-05-09 MED ORDER — ACETAMINOPHEN 325 MG PO TABS
650.0000 mg | ORAL_TABLET | Freq: Four times a day (QID) | ORAL | Status: DC | PRN
  Administered 2017-05-09 – 2017-05-11 (×7): 650 mg via ORAL
  Filled 2017-05-09 (×8): qty 2

## 2017-05-09 MED ORDER — STERILE WATER FOR INJECTION IJ SOLN
INTRAMUSCULAR | Status: AC
  Filled 2017-05-09: qty 10

## 2017-05-09 MED ORDER — OXCARBAZEPINE 300 MG PO TABS
300.0000 mg | ORAL_TABLET | Freq: Two times a day (BID) | ORAL | Status: DC
  Administered 2017-05-09: 300 mg via ORAL
  Filled 2017-05-09 (×4): qty 1

## 2017-05-09 MED ORDER — PALIPERIDONE ER 3 MG PO TB24
3.0000 mg | ORAL_TABLET | Freq: Every day | ORAL | Status: DC
  Filled 2017-05-09 (×2): qty 1

## 2017-05-09 MED ORDER — LORAZEPAM 1 MG PO TABS
1.0000 mg | ORAL_TABLET | Freq: Three times a day (TID) | ORAL | Status: DC | PRN
  Administered 2017-05-09 – 2017-05-15 (×7): 1 mg via ORAL
  Filled 2017-05-09 (×11): qty 1

## 2017-05-09 MED ORDER — ZIPRASIDONE MESYLATE 20 MG IM SOLR
20.0000 mg | Freq: Once | INTRAMUSCULAR | Status: AC
Start: 2017-05-09 — End: 2017-05-09
  Administered 2017-05-09: 20 mg via INTRAMUSCULAR
  Filled 2017-05-09: qty 20

## 2017-05-09 MED ORDER — PALIPERIDONE PALMITATE 234 MG/1.5ML IM SUSP
234.0000 mg | INTRAMUSCULAR | Status: DC
  Filled 2017-05-09: qty 1.5

## 2017-05-09 MED ORDER — LORATADINE 10 MG PO TABS
10.0000 mg | ORAL_TABLET | Freq: Every day | ORAL | Status: DC
  Administered 2017-05-09 – 2017-05-15 (×6): 10 mg via ORAL
  Filled 2017-05-09 (×10): qty 1

## 2017-05-09 MED ORDER — OLANZAPINE 10 MG PO TBDP
10.0000 mg | ORAL_TABLET | Freq: Once | ORAL | Status: AC
  Administered 2017-05-09: 10 mg via ORAL
  Filled 2017-05-09: qty 1

## 2017-05-09 MED ORDER — LORAZEPAM 2 MG/ML IJ SOLN
2.0000 mg | Freq: Once | INTRAMUSCULAR | Status: AC
  Administered 2017-05-09: 2 mg via INTRAMUSCULAR
  Filled 2017-05-09: qty 1

## 2017-05-09 MED ORDER — DIPHENHYDRAMINE HCL 50 MG/ML IJ SOLN
50.0000 mg | Freq: Once | INTRAMUSCULAR | Status: AC
  Administered 2017-05-09: 50 mg via INTRAMUSCULAR
  Filled 2017-05-09: qty 1

## 2017-05-09 MED ORDER — LEVOCETIRIZINE DIHYDROCHLORIDE 5 MG PO TABS: 5.0000 mg | ORAL_TABLET | Freq: Every evening | ORAL | Status: DC

## 2017-05-09 NOTE — ED Notes (Addendum)
Pt oriented to room and unit.  Pt is very tangential ,with flight of ideas.  Pt states she "needs pain meds"  When asked what hurts she first stated "my teeth" and then changed it to "my ear." 15 minute checks and video monitoring in place.  Pt appears to be responding to internal stimuli, getting agitated at her hallucinations.

## 2017-05-09 NOTE — ED Notes (Signed)
Unable to completes screening questions related to pt condition; yelling and restless.

## 2017-05-09 NOTE — ED Notes (Signed)
Pt had drawn for labs:  Red Gold Blue Lavender Lt green Dark green x2 

## 2017-05-09 NOTE — ED Notes (Signed)
Pt is very agitated and loud, disrupting the entire unit.  Prn orders obtained.

## 2017-05-09 NOTE — ED Notes (Signed)
Bed: [facility]3 Expected date:  Expected time:  Means of arrival:  Comments: 

## 2017-05-09 NOTE — ED Notes (Signed)
Pt sedated at present, no distress noted,  Monitoring for safety, Q 15 min checks in effect.

## 2017-05-09 NOTE — ED Notes (Signed)
Bed: [facility]36 Expected date:  Expected time:  Means of arrival:  Comments: Hold for triage 3 

## 2017-05-09 NOTE — ED Provider Notes (Signed)
Plan: follow up on troponin, if negative then medically clear.  Troponin normal, patient medically clear for psychiatric placement.     Cristina GongHammond, Jasher Barkan W, PA-C 05/09/17 2125    Tegeler, Canary Brimhristopher J, MD 05/10/17 [phone]

## 2017-05-09 NOTE — BH Assessment (Signed)
Assessment Note  Wendy Berry is an 31 y.o. female. Pt was BIB [employer]. She is currently being placed under IVC. Pt a poor historian as she appears to be actively hallucinating. Pt's speech is tangential. She says, "Wendy Berry is smart." She then starts yelling at a neighbor as if the neighbor is actually in her hospital room. Pt then starts speaking to Clinical research associatewriter again. Pt appears irritable and preoccupied. She yells at a woman named Wendy Berry who isn't actually there. Pt appears angry. Her UDS is negative and no etoh on board. She says she tries to stay away from the crackheads near her house. Pt says several times that all she wants is to clean her teeth. A couple of times pt says, "All I want is to be happy". Per chart review, pt was at a dentist's office today when she began throwing rocks and screaming and crying. Per chart, [facility]   Diagnosis: Schizophrenia  Past Medical History:  Past Medical History:  Diagnosis Date  . Cerebral aneurysm   . Heart murmur   . Mental retardation     Past Surgical History:  Procedure Laterality Date  . BRAIN SURGERY      Family History: No family history on file.  Social History:  reports that  has never smoked. she has never used smokeless tobacco. She reports that she does not drink alcohol or use drugs.  Additional Social History:  Alcohol / Drug Use Pain Medications: unable to assess Prescriptions: unable to assess Over the Counter: unable to assess History of alcohol / drug use?: (UDS negative & no etoh on board)  CIWA: CIWA-Ar BP: (!) 150/111 Pulse Rate: (!) 105 COWS:    Allergies: No Known Allergies  Home Medications:  (Not in a hospital admission)  OB/GYN Status:  No LMP recorded.  General Assessment Data TTS Assessment: In system Is this a Tele or Face-to-Face Assessment?: Face-to-Face Is this an Initial Assessment or a Re-assessment for this encounter?: Initial Assessment Marital status: Single Is  patient pregnant?: No Pregnancy Status: No Living Arrangements: Other relatives, Non-relatives/Friends(brother & someone the pt and bro take care of) Can pt return to current living arrangement?: Yes Admission Status: (pt being placed under IVC by EDP) Is patient capable of signing voluntary admission?: No Referral Source: ([employer]) Insurance type: self pay     Crisis Care Plan Living Arrangements: Other relatives, Non-relatives/Friends(brother & someone the pt and bro take care of) Name of Psychiatrist: unable to assess Name of Therapist: unable to assess  Education Status Is patient currently in school?: No  Risk to self with the past 6 months Suicidal Ideation: (unable to assess) Has patient been a risk to self within the past 6 months prior to admission? : (unable to assess) Suicidal Intent: (unable to assess) Has patient had any suicidal intent within the past 6 months prior to admission? : (unable to assess) Is patient at risk for suicide?: (unable to assess) Suicidal Plan?: (unable to assess) Has patient had any suicidal plan within the past 6 months prior to admission? : (unable to assess) Access to Means: (unable to assess) What has been your use of drugs/alcohol within the last 12 months?: UDS negative & etoh on board Previous Attempts/Gestures: (unable to assess) Other Self Harm Risks: unable to assess Triggers for Past Attempts: (unable to assess) Intentional Self Injurious Behavior: None Family Suicide History: Unable to assess Recent stressful life event(s): (pt says neighbors are bothering her) Persecutory voices/beliefs?: No Depression: No  Depression Symptoms: Feeling angry/irritable, Tearfulness Substance abuse history and/or treatment for substance abuse?: (unable to assess)  Risk to Others within the past 6 months Homicidal Ideation: (unable to assess) Does patient have any lifetime risk of violence toward others beyond the six months prior to admission? :  (unable to assess) Thoughts of Harm to Others: (unable to assess) Current Homicidal Intent: (unable to assess) Current Homicidal Plan: (unable to assess) Identified Victim: (unable to assess) History of harm to others?: (unable to assess) Assessment of Violence: None Noted Violent Behavior Description: unable to assess Does patient have access to weapons?: (unable to assess) Criminal Charges Pending?: Yes Describe Pending Criminal Charges: communicating threats, reckless driving Does patient have a court date: Yes Court Date: 05/16/17 Is patient on probation?: No  Psychosis Hallucinations: Auditory, Visual(pt appears to be actively hallucinating) Delusions: (unable to assess)  Mental Status Report Appearance/Hygiene: In scrubs(appears to have dried toothpaste around lips) Eye Contact: Poor Motor Activity: Freedom of movement, Restlessness Speech: Tangential, Aggressive Level of Consciousness: Alert, Restless, Irritable Mood: (unable to assess) Affect: Irritable, Preoccupied Anxiety Level: Moderate Thought Processes: Tangential Judgement: Impaired Orientation: Person Obsessive Compulsive Thoughts/Behaviors: Unable to Assess  Cognitive Functioning Concentration: Decreased Memory: Unable to Assess IQ: Average Insight: Poor Impulse Control: Poor Sleep: Unable to Assess Vegetative Symptoms: Unable to Assess  ADLScreening [facility] Assessment Services) Patient's cognitive ability adequate to safely complete daily activities?: No Patient able to express need for assistance with ADLs?: Yes Independently performs ADLs?: Yes (appropriate for developmental age)  Prior Inpatient Therapy Prior Inpatient Therapy: Yes Prior Therapy Dates: unknown Prior Therapy Facilty/Provider(s): pt sts she went to [facility]  Prior Outpatient Therapy Prior Outpatient Therapy: Yes Prior Therapy Dates: unknown Prior Therapy Facilty/Provider(s): per chart, pt had [facility] Does  patient have an ACCT team?: Unknown Does patient have Intensive In-House Services?  : No Does patient have [facility] services? : Unknown Does patient have P4CC services?: Unknown  ADL Screening (condition at time of admission) Patient's cognitive ability adequate to safely complete daily activities?: No Is the patient deaf or have difficulty hearing?: No Does the patient have difficulty seeing, even when wearing glasses/contacts?: No Does the patient have difficulty concentrating, remembering, or making decisions?: Yes Patient able to express need for assistance with ADLs?: Yes Does the patient have difficulty dressing or bathing?: No Independently performs ADLs?: Yes (appropriate for developmental age) Does the patient have difficulty walking or climbing stairs?: No Weakness of Legs: None Weakness of Arms/Hands: None  Home Assistive Devices/Equipment Home Assistive Devices/Equipment: None    Abuse/Neglect Assessment (Assessment to be complete while patient is alone) Abuse/Neglect Assessment Can Be Completed: Unable to assess, patient is non-responsive or altered mental status     Advance Directives (For Healthcare) Does Patient Have a Medical Advance Directive?: No Would patient like information on creating a medical advance directive?: No - Patient declined    Additional Information 1:1 In Past 12 Months?: (unable to assess) CIRT Risk: Yes Elopement Risk: Yes Does patient have medical clearance?: Yes     Disposition:  Disposition Initial Assessment Completed for this Encounter: Yes Disposition of Patient: Inpatient treatment program Type of inpatient treatment program: Adult([facility] np recommends inpatient treatment)  On Site Evaluation by:   Reviewed with Physician:    Donnamarie Rossetti P 05/09/2017 3:53 PM

## 2017-05-09 NOTE — ED Notes (Signed)
Mini lab aware of lab draw

## 2017-05-09 NOTE — ED Notes (Signed)
Pt asleep at this time. RN notified. Will return for vital signs when awake.

## 2017-05-09 NOTE — ED Triage Notes (Addendum)
Pt BIB by [employer] in custody found on throwing rocks at her dentist's office; with triage pt noted to be talking to self and hallucinations. Pt yelling and tearful.
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
## 2017-05-10 DIAGNOSIS — F22 Delusional disorders: Secondary | ICD-10-CM

## 2017-05-10 DIAGNOSIS — R441 Visual hallucinations: Secondary | ICD-10-CM | POA: Diagnosis not present

## 2017-05-10 DIAGNOSIS — Z046 Encounter for general psychiatric examination, requested by authority: Secondary | ICD-10-CM

## 2017-05-10 DIAGNOSIS — F79 Unspecified intellectual disabilities: Secondary | ICD-10-CM

## 2017-05-10 MED ORDER — LORAZEPAM 2 MG/ML IJ SOLN
1.0000 mg | Freq: Once | INTRAMUSCULAR | Status: AC
  Administered 2017-05-10: 1 mg via INTRAMUSCULAR
  Filled 2017-05-10: qty 1

## 2017-05-10 MED ORDER — ZIPRASIDONE HCL 20 MG PO CAPS
40.0000 mg | ORAL_CAPSULE | Freq: Two times a day (BID) | ORAL | Status: DC
  Filled 2017-05-10 (×2): qty 2

## 2017-05-10 MED ORDER — STERILE WATER FOR INJECTION IJ SOLN
INTRAMUSCULAR | Status: AC
  Administered 2017-05-10: 10 mL
  Filled 2017-05-10: qty 10

## 2017-05-10 MED ORDER — ZIPRASIDONE MESYLATE 20 MG IM SOLR
20.0000 mg | Freq: Two times a day (BID) | INTRAMUSCULAR | Status: DC | PRN
  Administered 2017-05-10 – 2017-05-13 (×4): 20 mg via INTRAMUSCULAR
  Filled 2017-05-10 (×5): qty 20

## 2017-05-10 MED ORDER — DIPHENHYDRAMINE HCL 50 MG/ML IJ SOLN
50.0000 mg | Freq: Once | INTRAMUSCULAR | Status: AC
  Administered 2017-05-10: 50 mg via INTRAMUSCULAR
  Filled 2017-05-10: qty 1

## 2017-05-10 NOTE — BHH Counselor (Signed)
Pt's referral has been faxed to the following inpatient treatment facilities:   [facility]  [facility]  [facility]   TTS will continue to follow up with inpatient treatment facilities.    Jakori Burkett D Ibrohim Simmers, MS, LPC, [facility] Triage Specialist [phone]  

## 2017-05-10 NOTE — ED Notes (Signed)
Pt given prune juice for c/o constipation.

## 2017-05-10 NOTE — ED Notes (Signed)
Pt is irritable, blaming and threatening others, slamming doors refusing to redirect. She is disorganized and staff have observed her in her room alone apparently responding to internal stimuli.

## 2017-05-10 NOTE — ED Notes (Signed)
Pt rambling incoherently, ambulating about the hallway, refusing to put her pants on.  Pt cursing at staff.  [employer] and [employer] called for assistance.

## 2017-05-10 NOTE — ED Notes (Signed)
Pt awake, alert & responsive, no distress noted,  Monitoring for safety, Q15 min checks in effect.

## 2017-05-10 NOTE — Progress Notes (Signed)
05/10/17 1403:  LRT was informed to let pt sleep by staff.   Caroll RancherMarjette Val Schiavo, LRT/CTRS

## 2017-05-10 NOTE — Consult Note (Signed)
[facility] Consult   Reason for Consult:  Manic and aggressive behavior Referring Physician:  EDP Patient Identification: Wendy Berry MRN:  [id number] Principal Diagnosis: Paranoid type delusional disorder [facility]) Diagnosis:   Patient Active Problem List   Diagnosis Date Noted  . Involuntary commitment [Z04.6]   . Paranoid type delusional disorder ([place]) [F22] 04/28/2015  . Mental retardation [F79] 04/28/2015    Total Time spent with patient: 45 minutes  Subjective:   Wendy Berry is a 31 y.o. female patient admitted with manic and aggressive behavior.  HPI:  Pt was seen and chart reviewed with treatment team and Dr Mariea Clonts. Pt presented to the [facility] under IVC due to throwing rocks, yelling and screaming at the dentists office. Pt has been manic in the SAPPU with pacing and hyper-verbal speech. Pt appears to be responding to internal stimuli. Pt is labile and agitated. PRN medications have been ordered to help Pt calm down. Pt would benefit from an inpatient psychiatric admission for crisis stabilization and medication management.   Past Psychiatric History: As above  Risk to Self: None. Denies SI. Risk to Others: Yes. Patient has been agitated and threatening staff and other patients. Criminal Charges Pending?: Yes Describe Pending Criminal Charges: communicating threats, reckless driving Does patient have a court date: Yes Court Date: 05/16/17 Prior Inpatient Therapy: Prior Inpatient Therapy: Yes Prior Therapy Dates: unknown Prior Therapy Facilty/Provider(s): pt sts she went to [facility] Prior Outpatient Therapy: Prior Outpatient Therapy: Yes Prior Therapy Dates: unknown Prior Therapy Facilty/Provider(s): per chart, pt had [facility] Does patient have an ACCT team?: Unknown Does patient have Intensive In-House Services?  : No Does patient have [facility] services? : Unknown Does patient have P4CC services?: Unknown  Past Medical History:   Past Medical History:  Diagnosis Date  . Cerebral aneurysm   . Heart murmur   . Mental retardation     Past Surgical History:  Procedure Laterality Date  . BRAIN SURGERY     Family History: No family history on file. Family Psychiatric  History: Unknown Social History:  Social History   Substance and Sexual Activity  Alcohol Use No     Social History   Substance and Sexual Activity  Drug Use No    Social History   Socioeconomic History  . Marital status: Single    Spouse name: None  . Number of children: None  . Years of education: None  . Highest education level: None  Social Needs  . Financial resource strain: None  . Food insecurity - worry: None  . Food insecurity - inability: None  . Transportation needs - medical: None  . Transportation needs - non-medical: None  Occupational History  . None  Tobacco Use  . Smoking status: Never Smoker  . Smokeless tobacco: Never Used  Substance and Sexual Activity  . Alcohol use: No  . Drug use: No  . Sexual activity: None  Other Topics Concern  . None  Social History Narrative  . None   Additional Social History: N/A    Allergies:  No Known Allergies  Labs:  Results for orders placed or performed during the hospital encounter of 05/09/17 (from the past 48 hour(s))  Comprehensive metabolic panel     Status: Abnormal   Collection Time: 05/09/17 12:39 PM  Result Value Ref Range   Sodium 140 135 - 145 mmol/L   Potassium 3.3 (L) 3.5 - 5.1 mmol/L   Chloride 104 101 - 111 mmol/L   CO2 23  22 - 32 mmol/L   Glucose, Bld 114 (H) 65 - 99 mg/dL   BUN 18 6 - 20 mg/dL   Creatinine, Ser 1.09 (H) 0.44 - 1.00 mg/dL   Calcium 9.6 8.9 - 10.3 mg/dL   Total Protein 9.1 (H) 6.5 - 8.1 g/dL   Albumin 4.1 3.5 - 5.0 g/dL   AST 25 15 - 41 U/L   ALT 18 14 - 54 U/L   Alkaline Phosphatase 76 38 - 126 U/L   Total Bilirubin 0.6 0.3 - 1.2 mg/dL   GFR calc non Af Amer >60 >60 mL/min   GFR calc Af Amer >60 >60 mL/min    Comment:  (NOTE) The eGFR has been calculated using the CKD EPI equation. This calculation has not been validated in all clinical situations. eGFR's persistently <60 mL/min signify possible Chronic Kidney Disease.    Anion gap 13 5 - 15  Ethanol     Status: None   Collection Time: 05/09/17 12:39 PM  Result Value Ref Range   Alcohol, Ethyl (B) <10 <10 mg/dL    Comment:        LOWEST DETECTABLE LIMIT FOR SERUM ALCOHOL IS 10 mg/dL FOR MEDICAL PURPOSES ONLY   Salicylate level     Status: None   Collection Time: 05/09/17 12:39 PM  Result Value Ref Range   Salicylate Lvl <7.6 2.8 - 30.0 mg/dL  Acetaminophen level     Status: Abnormal   Collection Time: 05/09/17 12:39 PM  Result Value Ref Range   Acetaminophen (Tylenol), Serum <10 (L) 10 - 30 ug/mL    Comment:        THERAPEUTIC CONCENTRATIONS VARY SIGNIFICANTLY. A RANGE OF 10-30 ug/mL MAY BE AN EFFECTIVE CONCENTRATION FOR MANY PATIENTS. HOWEVER, SOME ARE BEST TREATED AT CONCENTRATIONS OUTSIDE THIS RANGE. ACETAMINOPHEN CONCENTRATIONS >150 ug/mL AT 4 HOURS AFTER INGESTION AND >50 ug/mL AT 12 HOURS AFTER INGESTION ARE OFTEN ASSOCIATED WITH TOXIC REACTIONS.   cbc     Status: Abnormal   Collection Time: 05/09/17 12:39 PM  Result Value Ref Range   WBC 7.1 4.0 - 10.5 K/uL   RBC 4.16 3.87 - 5.11 MIL/uL   Hemoglobin 12.8 12.0 - 15.0 g/dL   HCT 37.3 36.0 - 46.0 %   MCV 89.7 78.0 - 100.0 fL   MCH 30.8 26.0 - 34.0 pg   MCHC 34.3 30.0 - 36.0 g/dL   RDW 14.3 11.5 - 15.5 %   Platelets 540 (H) 150 - 400 K/uL  hCG, quantitative, pregnancy     Status: None   Collection Time: 05/09/17 12:39 PM  Result Value Ref Range   hCG, Beta Chain, Quant, S 1 <5 mIU/mL    Comment:          GEST. AGE      CONC.  (mIU/mL)   <=1 WEEK        5 - 50     2 WEEKS       50 - 500     3 WEEKS       100 - 10,000     4 WEEKS     1,000 - 30,000     5 WEEKS     3,500 - 115,000   6-8 WEEKS     12,000 - 270,000    12 WEEKS     15,000 - 220,000        FEMALE AND  NON-PREGNANT FEMALE:     LESS THAN 5 mIU/mL   Rapid urine drug screen (hospital  performed)     Status: None   Collection Time: 05/09/17  1:20 PM  Result Value Ref Range   Opiates NONE DETECTED NONE DETECTED   Cocaine NONE DETECTED NONE DETECTED   Benzodiazepines NONE DETECTED NONE DETECTED   Amphetamines NONE DETECTED NONE DETECTED   Tetrahydrocannabinol NONE DETECTED NONE DETECTED   Barbiturates NONE DETECTED NONE DETECTED    Comment:        DRUG SCREEN FOR MEDICAL PURPOSES ONLY.  IF CONFIRMATION IS NEEDED FOR ANY PURPOSE, NOTIFY LAB WITHIN 5 DAYS.        LOWEST DETECTABLE LIMITS FOR URINE DRUG SCREEN Drug Class       Cutoff (ng/mL) Amphetamine      1000 Barbiturate      200 Benzodiazepine   809 Tricyclics       983 Opiates          300 Cocaine          300 THC              50   Troponin I     Status: None   Collection Time: 05/09/17  8:38 PM  Result Value Ref Range   Troponin I <0.03 <0.03 ng/mL    Current Facility-Administered Medications  Medication Dose Route Frequency Provider Last Rate Last Dose  . acetaminophen (TYLENOL) tablet 650 mg  650 mg Oral Q6H PRN Ethelene Hal, NP   650 mg at 05/10/17 1037  . loratadine (CLARITIN) tablet 10 mg  10 mg Oral QHS Berton Mount, RPH   10 mg at 05/09/17 2113  . LORazepam (ATIVAN) tablet 1 mg  1 mg Oral Q8H PRN Joy, Shawn C, PA-C   1 mg at 05/09/17 2112  . Oxcarbazepine (TRILEPTAL) tablet 300 mg  300 mg Oral BID Joy, Shawn C, PA-C   300 mg at 05/09/17 2112  . paliperidone (INVEGA SUSTENNA) injection 234 mg  234 mg Intramuscular Q28 days Joy, Shawn C, PA-C   Stopped at 05/09/17 1823  . triamterene-hydrochlorothiazide (MAXZIDE-25) 37.5-25 MG per tablet 1 tablet  1 tablet Oral Daily Joy, Shawn C, PA-C   Stopped at 05/09/17 1824  . ziprasidone (GEODON) capsule 40 mg  40 mg Oral BID WC Faythe Dingwall, DO      . ziprasidone (GEODON) injection 20 mg  20 mg Intramuscular Q12H PRN Faythe Dingwall, DO       Current  Outpatient Medications  Medication Sig Dispense Refill  . amantadine (SYMMETREL) 100 MG capsule Take 1 capsule (100 mg total) by mouth 2 (two) times daily. 60 capsule 0  . benztropine (COGENTIN) 1 MG tablet Take 1 mg by mouth 2 (two) times daily.    . fluticasone (FLONASE) 50 MCG/ACT nasal spray Place 2 sprays into both nostrils daily. (Patient not taking: Reported on 05/09/2017) 126 g 2  . hydrocortisone 2.5 % lotion Apply topically 2 (two) times daily. (Patient not taking: Reported on 05/09/2017) 59 mL 0  . hydrocortisone cream 1 % Apply to affected area 2 times daily (Patient not taking: Reported on 05/09/2017) 15 g 0  . levocetirizine (XYZAL) 5 MG tablet Take 1 tablet (5 mg total) by mouth every evening. (Patient not taking: Reported on 05/09/2017) 30 tablet 0  . LORazepam (ATIVAN) 1 MG tablet Take 1 tablet (1 mg total) by mouth every 8 (eight) hours as needed for anxiety (agitation). (Patient not taking: Reported on 05/09/2017) 30 tablet 0  . Multiple Vitamin (MULTIVITAMIN WITH MINERALS) TABS tablet Take 1 tablet by  mouth daily.    . ondansetron (ZOFRAN-ODT) 8 MG disintegrating tablet Take 1 tablet (8 mg total) by mouth every 8 (eight) hours as needed for nausea. (Patient not taking: Reported on 05/09/2017) 10 tablet 0  . Oxcarbazepine (TRILEPTAL) 300 MG tablet Take 1 tablet (300 mg total) by mouth 2 (two) times daily. (Patient not taking: Reported on 05/09/2017) 60 tablet 0  . paliperidone (INVEGA SUSTENNA) 234 MG/1.5ML SUSP injection Inject 234 mg into the muscle every 28 (twenty-eight) days.    . paliperidone (INVEGA) 3 MG 24 hr tablet Take 1 tablet (3 mg total) by mouth daily. (Patient not taking: Reported on 05/09/2017) 30 tablet 0  . polyvinyl alcohol (LIQUIFILM TEARS) 1.4 % ophthalmic solution Place 1 drop into both eyes as needed for dry eyes.    Marland Kitchen risperiDONE (RISPERDAL) 2 MG tablet Take 6 mg by mouth at bedtime.    . topiramate (TOPAMAX) 50 MG tablet Take 50 mg by mouth 3 (three) times daily.   4  . triamterene-hydrochlorothiazide (MAXZIDE-25) 37.5-25 MG tablet Take 1 tablet by mouth daily. (Patient not taking: Reported on 05/09/2017) 30 tablet 11  . trimethoprim-polymyxin b (POLYTRIM) ophthalmic solution Place 2 drops into both eyes every 4 (four) hours. (Patient not taking: Reported on 05/09/2017) 10 mL 0    Musculoskeletal: Strength & Muscle Tone: within normal limits Gait & Station: normal Patient leans: N/A  Psychiatric Specialty Exam: Physical Exam  Constitutional: She appears well-developed and well-nourished.  HENT:  Head: Normocephalic.  Respiratory: Effort normal.  Musculoskeletal: Normal range of motion.  Neurological: She is alert.  Psychiatric: Her mood appears anxious. Her affect is angry and labile. Her speech is rapid and/or pressured. She is agitated and aggressive. Thought content is paranoid and delusional. Cognition and memory are impaired. She expresses impulsivity.    Review of Systems  Psychiatric/Behavioral: Positive for hallucinations. Negative for depression, memory loss, substance abuse and suicidal ideas. The patient is nervous/anxious. The patient does not have insomnia.   All other systems reviewed and are negative.   Blood pressure (!) 153/100, pulse 96, temperature 97.6 F (36.4 C), resp. rate 20, SpO2 100 %.There is no height or weight on file to calculate BMI.  General Appearance: Casual  Eye Contact:  Good  Speech:  Pressured  Volume:  Increased  Mood:  Anxious and Irritable  Affect:  Labile  Thought Process:  Disorganized  Orientation:  Other:  person and place  Thought Content:  Illogical, Delusions, Rumination and Tangential  Suicidal Thoughts:  No  Homicidal Thoughts:  No  Memory:  Immediate;   Fair Recent;   Fair Remote;   Fair  Judgement:  Impaired  Insight:  Lacking  Psychomotor Activity:  Increased  Concentration:  Concentration: Poor and Attention Span: Poor  Recall:  [place] of Knowledge:  Good  Language:  Good   Akathisia:  No  Handed:  Right  AIMS (if indicated):   N/A  Assets:  Communication Skills Housing Resilience Social Support  ADL's:  Intact  Cognition:  WNL  Sleep:   Poor     Treatment Plan Summary: Daily contact with patient to assess and evaluate symptoms and progress in treatment and Medication management  -Patient refuses to take her home medications Lorayne Bender Sustenna 234 mg monthly and Trileptal 300 mg BID). -Continue behavioral medications: Geodon IM 20 mg BID PRN or oral if accepts medication. Will continue to offer PO 40 mg BID to see if patient accepts medication once her insight improves. -Continue Ativan PO  1 mg q 8 hours PRN for agitation/anxiety.   Disposition: Recommend psychiatric Inpatient admission when medically cleared.  Ethelene Hal, NP 05/10/2017 3:12 PM   Patient seen face-to-face for psychiatric evaluation, chart reviewed and case discussed with the physician extender and developed treatment plan. Reviewed the information documented and agree with the treatment plan.  Buford Dresser, DO

## 2017-05-10 NOTE — [facility] Counselor (Addendum)
Pt has been declined by [facility] due to acuity.    Redmond Pullingreylese D Demetrice Amstutz, MS, [facility], [facility] T[facility] Specialist [phone]

## 2017-05-10 NOTE — ED Notes (Signed)
Pt currently resting in bed with eyes closed. Got up once, after receiving IM medications, to go to the bathroom.

## 2017-05-11 DIAGNOSIS — R451 Restlessness and agitation: Secondary | ICD-10-CM

## 2017-05-11 DIAGNOSIS — F419 Anxiety disorder, unspecified: Secondary | ICD-10-CM

## 2017-05-11 DIAGNOSIS — R441 Visual hallucinations: Secondary | ICD-10-CM | POA: Diagnosis not present

## 2017-05-11 MED ORDER — ACETAMINOPHEN 325 MG PO TABS: 650.0000 mg | ORAL_TABLET | Freq: Once | ORAL | Status: DC

## 2017-05-11 MED ORDER — BENZOCAINE 10 % MT GEL
Freq: Four times a day (QID) | OROMUCOSAL | Status: DC | PRN
  Administered 2017-05-12 – 2017-05-14 (×3): via OROMUCOSAL
  Administered 2017-05-15 – 2017-05-16 (×2): 1 via OROMUCOSAL
  Filled 2017-05-11 (×2): qty 9.4

## 2017-05-11 MED ORDER — DIPHENHYDRAMINE HCL 50 MG/ML IJ SOLN
INTRAMUSCULAR | Status: AC
  Administered 2017-05-11: 50 mg
  Filled 2017-05-11: qty 1

## 2017-05-11 MED ORDER — PALIPERIDONE PALMITATE 234 MG/1.5ML IM SUSP
234.0000 mg | Freq: Once | INTRAMUSCULAR | Status: AC
  Administered 2017-05-11: 234 mg via INTRAMUSCULAR
  Filled 2017-05-11: qty 1.5

## 2017-05-11 MED ORDER — DIPHENHYDRAMINE HCL 50 MG/ML IJ SOLN: 50.0000 mg | Freq: Once | INTRAMUSCULAR | Status: DC

## 2017-05-11 MED ORDER — STERILE WATER FOR INJECTION IJ SOLN
INTRAMUSCULAR | Status: AC
  Administered 2017-05-11: 10 mL
  Filled 2017-05-11: qty 10

## 2017-05-11 MED ORDER — ACETAMINOPHEN 325 MG PO TABS
650.0000 mg | ORAL_TABLET | Freq: Four times a day (QID) | ORAL | Status: DC | PRN
  Administered 2017-05-11 – 2017-05-16 (×10): 650 mg via ORAL
  Filled 2017-05-11 (×11): qty 2

## 2017-05-11 MED ORDER — LORAZEPAM 2 MG/ML IJ SOLN
INTRAMUSCULAR | Status: AC
  Administered 2017-05-11: 2 mg
  Filled 2017-05-11: qty 1

## 2017-05-11 MED ORDER — AMOXICILLIN 500 MG PO CAPS
500.0000 mg | ORAL_CAPSULE | Freq: Two times a day (BID) | ORAL | Status: DC
  Administered 2017-05-11 – 2017-05-16 (×8): 500 mg via ORAL
  Filled 2017-05-11 (×13): qty 1

## 2017-05-11 MED ORDER — LORAZEPAM 1 MG PO TABS
2.0000 mg | ORAL_TABLET | Freq: Once | ORAL | Status: AC
  Administered 2017-05-11: 2 mg via ORAL
  Filled 2017-05-11: qty 2

## 2017-05-11 MED ORDER — LORAZEPAM 2 MG/ML IJ SOLN: 2.0000 mg | Freq: Once | INTRAMUSCULAR | Status: AC

## 2017-05-11 NOTE — Consult Note (Addendum)
[facility] Psych ED Progress Note  05/11/2017 11:39 AM RITAJ DULLEA  MRN:  [id number] Subjective:   Ms. Korte was sleep this morning after receiving after receiving behavioral medications (Geodon/Ativan/Benadryl). She also received medications yesterday for agitation. According to nursing records, she was very agitated this morning and dumped her tray into another patient's room. She has been yelling and responding to internal stimuli. She has been refusing PO medications and will need forced medications over objection. Patient discussed with Dr. Myrene Buddy who agrees with this plan.   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 Involuntary commitment [Z04.6]   . 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 Mental retardation [F79] 04/28/2015   Total Time spent with patient: 15 minutes  Past Psychiatric History: Schizophrenia   Past Medical History:  Past Medical History:  Diagnosis Date  . Cerebral aneurysm   . Heart murmur   . Mental retardation     Past Surgical History:  Procedure Laterality Date  . BRAIN SURGERY     Family History: No family history on file. Family Psychiatric  History: Unknown  Social History:  Social History   Substance and Sexual Activity  Alcohol Use No     Social History   Substance and Sexual Activity  Drug Use No    Social History   Socioeconomic History  . Marital status: Single    Spouse name: None  . Number of children: None  . Years of education: None  . Highest education level: None  Social Needs  . Financial resource strain: None  . Food insecurity - worry: None  . Food insecurity - inability: None  . Transportation needs - medical: None  . Transportation needs - non-medical: None  Occupational History  . None  Tobacco Use  . Smoking status: Never Smoker  . Smokeless tobacco: Never Used  Substance and Sexual Activity  . Alcohol  use: No  . Drug use: No  . Sexual activity: None  Other Topics Concern  . None  Social History Narrative  . None    Sleep: Poor  Appetite:  Fair  Current Medications: Current Facility-Administered Medications  Medication Dose Route Frequency Provider Last Rate Last Dose  . acetaminophen (TYLENOL) tablet 650 mg  650 mg Oral Q6H PRN Ethelene Hal, NP   650 mg at 05/11/17 0423  . diphenhydrAMINE (BENADRYL) injection 50 mg  50 mg Intramuscular Once Ethelene Hal, NP      . loratadine (CLARITIN) tablet 10 mg  10 mg Oral QHS Berton Mount, RPH   10 mg at 05/09/17 2113  . LORazepam (ATIVAN) tablet 2 mg  2 mg Oral Once Ethelene Hal, NP       Or  . LORazepam (ATIVAN) injection 2 mg  2 mg Intramuscular Once Ethelene Hal, NP      . LORazepam (ATIVAN) tablet 1 mg  1 mg Oral Q8H PRN Joy, Shawn C, PA-C   1 mg at 05/09/17 2112  . Oxcarbazepine (TRILEPTAL) tablet 300 mg  300 mg Oral BID Joy, Shawn C, PA-C   Stopped at 05/11/17 1108  . paliperidone (INVEGA SUSTENNA) injection 234 mg  234 mg Intramuscular Q28 days Joy, Shawn C, PA-C   Stopped at 05/09/17 1823  . triamterene-hydrochlorothiazide (MAXZIDE-25) 37.5-25 MG per tablet 1 tablet  1 tablet Oral Daily Joy, Shawn C, PA-C   Stopped at 05/09/17 1824  . ziprasidone (GEODON) capsule  40 mg  40 mg Oral BID WC Faythe Dingwall, DO      . ziprasidone (GEODON) injection 20 mg  20 mg Intramuscular Q12H PRN Faythe Dingwall, DO   20 mg at 05/11/17 2876   Current Outpatient Medications  Medication Sig Dispense Refill  . amantadine (SYMMETREL) 100 MG capsule Take 1 capsule (100 mg total) by mouth 2 (two) times daily. 60 capsule 0  . benztropine (COGENTIN) 1 MG tablet Take 1 mg by mouth 2 (two) times daily.    . fluticasone (FLONASE) 50 MCG/ACT nasal spray Place 2 sprays into both nostrils daily. (Patient not taking: Reported on 05/09/2017) 126 g 2  . hydrocortisone 2.5 % lotion Apply topically 2 (two) times daily.  (Patient not taking: Reported on 05/09/2017) 59 mL 0  . hydrocortisone cream 1 % Apply to affected area 2 times daily (Patient not taking: Reported on 05/09/2017) 15 g 0  . levocetirizine (XYZAL) 5 MG tablet Take 1 tablet (5 mg total) by mouth every evening. (Patient not taking: Reported on 05/09/2017) 30 tablet 0  . LORazepam (ATIVAN) 1 MG tablet Take 1 tablet (1 mg total) by mouth every 8 (eight) hours as needed for anxiety (agitation). (Patient not taking: Reported on 05/09/2017) 30 tablet 0  . Multiple Vitamin (MULTIVITAMIN WITH MINERALS) TABS tablet Take 1 tablet by mouth daily.    . ondansetron (ZOFRAN-ODT) 8 MG disintegrating tablet Take 1 tablet (8 mg total) by mouth every 8 (eight) hours as needed for nausea. (Patient not taking: Reported on 05/09/2017) 10 tablet 0  . Oxcarbazepine (TRILEPTAL) 300 MG tablet Take 1 tablet (300 mg total) by mouth 2 (two) times daily. (Patient not taking: Reported on 05/09/2017) 60 tablet 0  . paliperidone (INVEGA SUSTENNA) 234 MG/1.5ML SUSP injection Inject 234 mg into the muscle every 28 (twenty-eight) days.    . paliperidone (INVEGA) 3 MG 24 hr tablet Take 1 tablet (3 mg total) by mouth daily. (Patient not taking: Reported on 05/09/2017) 30 tablet 0  . polyvinyl alcohol (LIQUIFILM TEARS) 1.4 % ophthalmic solution Place 1 drop into both eyes as needed for dry eyes.    Marland Kitchen risperiDONE (RISPERDAL) 2 MG tablet Take 6 mg by mouth at bedtime.    . topiramate (TOPAMAX) 50 MG tablet Take 50 mg by mouth 3 (three) times daily.  4  . triamterene-hydrochlorothiazide (MAXZIDE-25) 37.5-25 MG tablet Take 1 tablet by mouth daily. (Patient not taking: Reported on 05/09/2017) 30 tablet 11  . trimethoprim-polymyxin b (POLYTRIM) ophthalmic solution Place 2 drops into both eyes every 4 (four) hours. (Patient not taking: Reported on 05/09/2017) 10 mL 0    Lab Results:  Results for orders placed or performed during the hospital encounter of 05/09/17 (from the past 48 hour(s))   Comprehensive metabolic panel     Status: Abnormal   Collection Time: 05/09/17 12:39 PM  Result Value Ref Range   Sodium 140 135 - 145 mmol/L   Potassium 3.3 (L) 3.5 - 5.1 mmol/L   Chloride 104 101 - 111 mmol/L   CO2 23 22 - 32 mmol/L   Glucose, Bld 114 (H) 65 - 99 mg/dL   BUN 18 6 - 20 mg/dL   Creatinine, Ser 1.09 (H) 0.44 - 1.00 mg/dL   Calcium 9.6 8.9 - 10.3 mg/dL   Total Protein 9.1 (H) 6.5 - 8.1 g/dL   Albumin 4.1 3.5 - 5.0 g/dL   AST 25 15 - 41 U/L   ALT 18 14 - 54 U/L   Alkaline  Phosphatase 76 38 - 126 U/L   Total Bilirubin 0.6 0.3 - 1.2 mg/dL   GFR calc non Af Amer >60 >60 mL/min   GFR calc Af Amer >60 >60 mL/min    Comment: (NOTE) The eGFR has been calculated using the CKD EPI equation. This calculation has not been validated in all clinical situations. eGFR's persistently <60 mL/min signify possible Chronic Kidney Disease.    Anion gap 13 5 - 15  Ethanol     Status: None   Collection Time: 05/09/17 12:39 PM  Result Value Ref Range   Alcohol, Ethyl (B) <10 <10 mg/dL    Comment:        LOWEST DETECTABLE LIMIT FOR SERUM ALCOHOL IS 10 mg/dL FOR MEDICAL PURPOSES ONLY   Salicylate level     Status: None   Collection Time: 05/09/17 12:39 PM  Result Value Ref Range   Salicylate Lvl <8.9 2.8 - 30.0 mg/dL  Acetaminophen level     Status: Abnormal   Collection Time: 05/09/17 12:39 PM  Result Value Ref Range   Acetaminophen (Tylenol), Serum <10 (L) 10 - 30 ug/mL    Comment:        THERAPEUTIC CONCENTRATIONS VARY SIGNIFICANTLY. A RANGE OF 10-30 ug/mL MAY BE AN EFFECTIVE CONCENTRATION FOR MANY PATIENTS. HOWEVER, SOME ARE BEST TREATED AT CONCENTRATIONS OUTSIDE THIS RANGE. ACETAMINOPHEN CONCENTRATIONS >150 ug/mL AT 4 HOURS AFTER INGESTION AND >50 ug/mL AT 12 HOURS AFTER INGESTION ARE OFTEN ASSOCIATED WITH TOXIC REACTIONS.   cbc     Status: Abnormal   Collection Time: 05/09/17 12:39 PM  Result Value Ref Range   WBC 7.1 4.0 - 10.5 K/uL   RBC 4.16 3.87 - 5.11  MIL/uL   Hemoglobin 12.8 12.0 - 15.0 g/dL   HCT 37.3 36.0 - 46.0 %   MCV 89.7 78.0 - 100.0 fL   MCH 30.8 26.0 - 34.0 pg   MCHC 34.3 30.0 - 36.0 g/dL   RDW 14.3 11.5 - 15.5 %   Platelets 540 (H) 150 - 400 K/uL  hCG, quantitative, pregnancy     Status: None   Collection Time: 05/09/17 12:39 PM  Result Value Ref Range   hCG, Beta Chain, Quant, S 1 <5 mIU/mL    Comment:          GEST. AGE      CONC.  (mIU/mL)   <=1 WEEK        5 - 50     2 WEEKS       50 - 500     3 WEEKS       100 - 10,000     4 WEEKS     1,000 - 30,000     5 WEEKS     3,500 - 115,000   6-8 WEEKS     12,000 - 270,000    12 WEEKS     15,000 - 220,000        FEMALE AND NON-PREGNANT FEMALE:     LESS THAN 5 mIU/mL   Rapid urine drug screen (hospital performed)     Status: None   Collection Time: 05/09/17  1:20 PM  Result Value Ref Range   Opiates NONE DETECTED NONE DETECTED   Cocaine NONE DETECTED NONE DETECTED   Benzodiazepines NONE DETECTED NONE DETECTED   Amphetamines NONE DETECTED NONE DETECTED   Tetrahydrocannabinol NONE DETECTED NONE DETECTED   Barbiturates NONE DETECTED NONE DETECTED    Comment:        DRUG SCREEN FOR MEDICAL PURPOSES  ONLY.  IF CONFIRMATION IS NEEDED FOR ANY PURPOSE, NOTIFY LAB WITHIN 5 DAYS.        LOWEST DETECTABLE LIMITS FOR URINE DRUG SCREEN Drug Class       Cutoff (ng/mL) Amphetamine      1000 Barbiturate      200 Benzodiazepine   676 Tricyclics       195 Opiates          300 Cocaine          300 THC              50   Troponin I     Status: None   Collection Time: 05/09/17  8:38 PM  Result Value Ref Range   Troponin I <0.03 <0.03 ng/mL    Blood Alcohol level:  Lab Results  Component Value Date   ETH <10 05/09/2017   ETH <5 04/29/2016    Musculoskeletal: Strength & Muscle Tone: within normal limits Gait & Station: normal Patient leans: N/A  Psychiatric Specialty Exam: Physical Exam  Nursing note and vitals reviewed. Constitutional: She appears well-developed  and well-nourished.  HENT:  Head: Normocephalic and atraumatic.  Neck: Normal range of motion.  Respiratory: Effort normal.  Musculoskeletal: Normal range of motion.  Skin: No rash noted.    ROSunable to assess since patient was asleep after receiving emergency medications.   Blood pressure 138/85, pulse 95, temperature 98.6 F (37 C), temperature source Oral, resp. rate 16, SpO2 100 %.There is no height or weight on file to calculate BMI.  General Appearance: Well Groomed  Eye Contact:  Poor since patient is sleep.   Speech: UTA since patient is sleep.   Volume: UTA since patient is sleep.   Mood: UTA since patient is sleep.   Affect:  UTA since patient is sleep.   Thought Process: UTA since patient is sleep.   Orientation: UTA since patient is sleep.   Thought Content: UTA since patient is sleep.   Suicidal Thoughts:  UTA since patient is sleep.   Homicidal Thoughts: UTA since patient is sleep.    Memory: UTA since patient is sleep.   Judgement: UTA since patient is sleep.   Insight: UTA since patient is sleep.   Psychomotor Activity:  UTA since patient is sleep.   Concentration:  UTA since patient is sleep.   Recall: UTA since patient is sleep.   Fund of Knowledge: UTA since patient is sleep.   Language:  UTA since patient is sleep.   Akathisia:  N/A  Handed:  Right  AIMS (if indicated):   N/A  Assets:  Housing  ADL's:  Intact  Cognition:   Sleep:   Has been poor.   Assessment: BRUCHY MIKEL is a 31 y.o. female who was admitted with agitation and psychosis likely in the setting of medication noncompliance. She continues to be agitated and threatening to staff. She has been refusing oral medications and will need forced medications for stabilization. Discussed plan with Dr. Myrene Buddy who agrees.      Treatment Plan Summary: Daily contact with patient to assess and evaluate symptoms and progress in treatment and Medication management  -Give patient monthly Invega  Sustenna 234 mg monthly as forced medication. -Continue behavioral medications: Geodon IM 20 mg BID PRN or oral if accepts medication. -Will not restart Trileptal 300 mg BID since patient refusing PO medications. -Continue Ativan PO 1 mg q 8 hours PRN for agitation/anxiety.  -Patient continues to require inpatient psychiatric hospitalization for stabilization and  treatment.     Faythe Dingwall, DO 05/11/2017, 11:39 AM

## 2017-05-11 NOTE — Progress Notes (Signed)
05/11/17 1354:  LRT introduced self to pt and offered activities, pt declined.  Pt wanted to know when someone would talk to her about her leaving.  LRT directed pt to speak with her nurse regarding her discharge.   Caroll RancherMarjette Lynnmarie Lovett, LRT/CTRS

## 2017-05-11 NOTE — ED Notes (Signed)
Pt is very agitated this morning.  She has dumped her tray in another room.  She is yelling and cursing and responding to internal stimuli.  She continues to stand at nurses station and being very restless.

## 2017-05-11 NOTE — ED Provider Notes (Signed)
Patient remains agitated and aggressive in the emergency department, unable to be verbally redirected.  Discussed with psychiatry, agree with forced medications for patient and staff safety.   Shaune PollackIsaacs, Yesha Muchow, MD 05/11/17 [phone]

## 2017-05-11 NOTE — BH Assessment (Signed)
Pt chart under review by Dr.Pucilowska for possible [facility] BMU admission. 

## 2017-05-11 NOTE — Progress Notes (Signed)
Patient ID: Wendy Berry, female   DOB: 09/21/1985, 31 y.o.   MRN: [id number]  I am reviewing the chart.  Patient agitated, uncooperative, received Invega sustenna injection today. She has prn geodon only.  Can we give her standing antipsychotic and mood stabilizer? tp calm her down.  See no psychiatric consult.  Her address is [address]. Where is she from?  Diagnosis of mental retardation. Cen she participate in programming?

## 2017-05-11 NOTE — ED Notes (Signed)
Pt is much calmer this afternoon.  She took her injections without difficulty.  She has been interacting with other patients.  Pt denies S/I, H/I, and AVH.  15 minute checks and video monitoring in place.

## 2017-05-11 NOTE — BH Assessment (Signed)
[facility]  Per Tyna JakschJacquline Norman, DO, this pt requires psychiatric hospitalization at this time.  The following facilities have been contacted to seek placement for this pt, with results as noted:  [facility] available, information sent, decision pending[facility]   [facility]  [facility]   Doylene Canninghomas Danaye Sobh, [place] Triage Specialist [phone]

## 2017-05-11 NOTE — ED Provider Notes (Signed)
Patient awake and alert, ambulatory. She requests evaluation of dental pain. On exam she has no malocclusion, no TMJ tenderness. She does have tenderness about the left inferior mandible, no obvious dental fracture or deformity. Some induration in this area and exquisite TTP. With some suspicion for dental irritation, patient will receive Tylenol, short course of ABX for presumed dental infection.. Patient is afebrile, otherwise well-appearing.   Gerhard MunchLockwood, Robel Wuertz, MD 05/11/17 [phone]

## 2017-05-12 DIAGNOSIS — R443 Hallucinations, unspecified: Secondary | ICD-10-CM | POA: Insufficient documentation

## 2017-05-12 DIAGNOSIS — R441 Visual hallucinations: Secondary | ICD-10-CM | POA: Diagnosis not present

## 2017-05-12 MED ORDER — DIPHENHYDRAMINE HCL 50 MG/ML IJ SOLN
50.0000 mg | Freq: Once | INTRAMUSCULAR | Status: AC
  Administered 2017-05-12: 50 mg via INTRAMUSCULAR
  Filled 2017-05-12: qty 1

## 2017-05-12 MED ORDER — LORAZEPAM 2 MG/ML IJ SOLN
2.0000 mg | Freq: Once | INTRAMUSCULAR | Status: AC
  Administered 2017-05-12: 2 mg via INTRAMUSCULAR
  Filled 2017-05-12: qty 1

## 2017-05-12 MED ORDER — ASENAPINE MALEATE 5 MG SL SUBL
10.0000 mg | SUBLINGUAL_TABLET | Freq: Two times a day (BID) | SUBLINGUAL | Status: DC
  Administered 2017-05-12 – 2017-05-16 (×8): 10 mg via SUBLINGUAL
  Filled 2017-05-12 (×9): qty 2

## 2017-05-12 MED ORDER — STERILE WATER FOR INJECTION IJ SOLN
INTRAMUSCULAR | Status: AC
  Administered 2017-05-12: 10 mL
  Filled 2017-05-12: qty 10

## 2017-05-12 NOTE — ED Notes (Signed)
Pt given IM medication to help her calm down.

## 2017-05-12 NOTE — ED Notes (Signed)
Patient has been awake, active and pacing the unit all night. Patient seen moving from one bathroom to another styling her hair. Patient easily redirectable but will come right back. At times loud and disruptive to the milieu. Attention seeking and demanding. Patient complained of tooth ache of 10/10. Accepted prescribed Antibiotic, Tylenol and Orajel with no good effect as patient continues to demand more. Ice pack offered. Patient still awake pacing after dose of 2 mg of Ativan. Will continue to monitor patient.

## 2017-05-12 NOTE — ED Notes (Signed)
Patient closed her door and took her scrub pants off and stood naked in front of the mirror. Pt states "I have to see how I'm looking right now". This writer instructed pt to get dressed and to keep door open; pt compliant and redirectable.

## 2017-05-12 NOTE — ED Notes (Signed)
Pt given prune juice per her request.

## 2017-05-12 NOTE — ED Notes (Signed)
This morning pt's agitation is increasing and she is not redirectable. She is yelling and insulting staff and patients. She will not take po medications. When this writer tried to give po antibiotic to her, she grabbed it and ran to her room with it. Unknown if she took. It or not.

## 2017-05-12 NOTE — ED Notes (Signed)
Patient pleasant and cooperative with care this shift. Pt denies suicidal or homicidal ideations at this time and verbally contracts for safety. Pt does, however, continue to voice delusions and is focused on getting "New teeth". Pt with sitter at bedside for safety.

## 2017-05-12 NOTE — ED Notes (Signed)
Pt transferred to TCU for her safety.

## 2017-05-12 NOTE — ED Notes (Signed)
Pt pacing back and forth in her room. She is verbally threatening and berating staff.

## 2017-05-12 NOTE — Progress Notes (Signed)
05/12/17 1345:  LRT went to pt room to offer activities, pt was sleep.   Caroll RancherMarjette Divit Stipp, LRT/CTRS

## 2017-05-12 NOTE — Consult Note (Signed)
[facility] Face-to-Face Psychiatry Consult   Reason for Consult:  Psychosis  Referring Physician:  EDP Patient Identification: Wendy Berry MRN:  [id number] Principal Diagnosis: Paranoid type delusional disorder [facility]) Diagnosis:   Patient Active Problem List   Diagnosis Date Noted  . 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 Involuntary commitment [Z04.6]     Total Time spent with patient: 45 minutes  Subjective:   Wendy Berry is a 31 y.o. female patient admitted with psychosis.  HPI:  31 yo female who presented to the ED with psychosis.  She continues to be delusional and psychotic, rolling on the floor yelling that things are on her.  Loud, intrusive, angry and emergency behavioral medications needed.    Past Psychiatric History: psychosis  Risk to Self: Suicidal Ideation: (unable to assess) Suicidal Intent: (unable to assess) Is patient at risk for suicide?: (unable to assess) Suicidal Plan?: (unable to assess) Access to Means: (unable to assess) What has been your use of drugs/alcohol within the last 12 months?: UDS negative & etoh on board Other Self Harm Risks: unable to assess Triggers for Past Attempts: (unable to assess) Intentional Self Injurious Behavior: None Risk to Others: Homicidal Ideation: (unable to assess) Thoughts of Harm to Others: (unable to assess) Current Homicidal Intent: (unable to assess) Current Homicidal Plan: (unable to assess) Identified Victim: (unable to assess) History of harm to others?: (unable to assess) Assessment of Violence: None Noted Violent Behavior Description: unable to assess Does patient have access to weapons?: (unable to assess) Criminal Charges Pending?: Yes Describe Pending Criminal Charges: communicating threats, reckless driving Does patient have a court date: Yes Court Date: 05/16/17 Prior Inpatient Therapy: Prior Inpatient Therapy: Yes Prior Therapy Dates: unknown Prior  Therapy Facilty/Provider(s): pt sts she went to [facility] Prior Outpatient Therapy: Prior Outpatient Therapy: Yes Prior Therapy Dates: unknown Prior Therapy Facilty/Provider(s): per chart, pt had [facility] Does patient have an ACCT team?: Unknown Does patient have Intensive In-House Services?  : No Does patient have [facility] services? : Unknown Does patient have P4CC services?: Unknown  Past Medical History:  Past Medical History:  Diagnosis Date  . Cerebral aneurysm   . Heart murmur   . Mental retardation     Past Surgical History:  Procedure Laterality Date  . BRAIN SURGERY     Family History: No family history on file. Family Psychiatric  History: unknown Social History:  Social History   Substance and Sexual Activity  Alcohol Use No     Social History   Substance and Sexual Activity  Drug Use No    Social History   Socioeconomic History  . Marital status: Single    Spouse name: None  . Number of children: None  . Years of education: None  . Highest education level: None  Social Needs  . Financial resource strain: None  . Food insecurity - worry: None  . Food insecurity - inability: None  . Transportation needs - medical: None  . Transportation needs - non-medical: None  Occupational History  . None  Tobacco Use  . Smoking status: Never Smoker  . Smokeless tobacco: Never Used  Substance and Sexual Activity  . Alcohol use: No  . Drug use: No  . Sexual activity: None  Other Topics Concern  . None  Social History Narrative  . None   Additional Social History: N/A    Allergies:  No Known Allergies  Labs: No results found for this or  any previous visit (from the past 48 hour(s)).  Current Facility-Administered Medications  Medication Dose Route Frequency Provider Last Rate Last Dose  . acetaminophen (TYLENOL) tablet 650 mg  650 mg Oral Q6H PRN Gerhard MunchLockwood, Robert, MD   650 mg at 05/12/17 0338  . amoxicillin (AMOXIL) capsule 500 mg  500  mg Oral Q12H Gerhard MunchLockwood, Robert, MD   500 mg at 05/11/17 2010  . benzocaine (ORAJEL) 10 % mucosal gel   Mouth/Throat QID PRN Gerhard MunchLockwood, Robert, MD      . loratadine (CLARITIN) tablet 10 mg  10 mg Oral QHS Aleda GranaZeigler, Dustin G, RPH   10 mg at 05/11/17 2011  . LORazepam (ATIVAN) tablet 1 mg  1 mg Oral Q8H PRN Joy, Shawn C, PA-C   1 mg at 05/09/17 2112  . triamterene-hydrochlorothiazide (MAXZIDE-25) 37.5-25 MG per tablet 1 tablet  1 tablet Oral Daily Joy, Shawn C, PA-C   Stopped at 05/09/17 1824  . ziprasidone (GEODON) injection 20 mg  20 mg Intramuscular Q12H PRN Cherly Beachorman, Jacqueline J, DO   20 mg at 05/12/17 1057   Current Outpatient Medications  Medication Sig Dispense Refill  . amantadine (SYMMETREL) 100 MG capsule Take 1 capsule (100 mg total) by mouth 2 (two) times daily. 60 capsule 0  . benztropine (COGENTIN) 1 MG tablet Take 1 mg by mouth 2 (two) times daily.    . fluticasone (FLONASE) 50 MCG/ACT nasal spray Place 2 sprays into both nostrils daily. (Patient not taking: Reported on 05/09/2017) 126 g 2  . hydrocortisone 2.5 % lotion Apply topically 2 (two) times daily. (Patient not taking: Reported on 05/09/2017) 59 mL 0  . hydrocortisone cream 1 % Apply to affected area 2 times daily (Patient not taking: Reported on 05/09/2017) 15 g 0  . levocetirizine (XYZAL) 5 MG tablet Take 1 tablet (5 mg total) by mouth every evening. (Patient not taking: Reported on 05/09/2017) 30 tablet 0  . LORazepam (ATIVAN) 1 MG tablet Take 1 tablet (1 mg total) by mouth every 8 (eight) hours as needed for anxiety (agitation). (Patient not taking: Reported on 05/09/2017) 30 tablet 0  . Multiple Vitamin (MULTIVITAMIN WITH MINERALS) TABS tablet Take 1 tablet by mouth daily.    . ondansetron (ZOFRAN-ODT) 8 MG disintegrating tablet Take 1 tablet (8 mg total) by mouth every 8 (eight) hours as needed for nausea. (Patient not taking: Reported on 05/09/2017) 10 tablet 0  . Oxcarbazepine (TRILEPTAL) 300 MG tablet Take 1 tablet (300 mg total)  by mouth 2 (two) times daily. (Patient not taking: Reported on 05/09/2017) 60 tablet 0  . paliperidone (INVEGA SUSTENNA) 234 MG/1.5ML SUSP injection Inject 234 mg into the muscle every 28 (twenty-eight) days.    . paliperidone (INVEGA) 3 MG 24 hr tablet Take 1 tablet (3 mg total) by mouth daily. (Patient not taking: Reported on 05/09/2017) 30 tablet 0  . polyvinyl alcohol (LIQUIFILM TEARS) 1.4 % ophthalmic solution Place 1 drop into both eyes as needed for dry eyes.    Marland Kitchen. risperiDONE (RISPERDAL) 2 MG tablet Take 6 mg by mouth at bedtime.    . topiramate (TOPAMAX) 50 MG tablet Take 50 mg by mouth 3 (three) times daily.  4  . triamterene-hydrochlorothiazide (MAXZIDE-25) 37.5-25 MG tablet Take 1 tablet by mouth daily. (Patient not taking: Reported on 05/09/2017) 30 tablet 11  . trimethoprim-polymyxin b (POLYTRIM) ophthalmic solution Place 2 drops into both eyes every 4 (four) hours. (Patient not taking: Reported on 05/09/2017) 10 mL 0    Musculoskeletal: Strength & Muscle Tone:  within normal limits Gait & Station: normal Patient leans: N/A  Psychiatric Specialty Exam: Physical Exam  Constitutional: She is oriented to person, place, and time. She appears well-developed and well-nourished.  HENT:  Head: Normocephalic.  Neck: Normal range of motion.  Respiratory: Effort normal.  Musculoskeletal: Normal range of motion.  Neurological: She is alert and oriented to person, place, and time.  Psychiatric: Her mood appears anxious. Her affect is labile. Her speech is rapid and/or pressured. She is agitated and actively hallucinating. Thought content is paranoid and delusional. Cognition and memory are impaired. She expresses impulsivity. She is inattentive.    Review of Systems  Psychiatric/Behavioral: Positive for hallucinations. The patient is nervous/anxious.   All other systems reviewed and are negative.   Blood pressure (!) 143/92, pulse 86, temperature 98.7 F (37.1 C), temperature source Oral,  resp. rate 16, SpO2 98 %.There is no height or weight on file to calculate BMI.  General Appearance: Casual  Eye Contact:  Fair  Speech:  Pressured  Volume:  Increased  Mood:  Angry and Irritable  Affect:  Blunt  Thought Process:  Descriptions of Associations: Tangential  Orientation:  Other:  person  Thought Content:  Hallucinations: Auditory Visual, Paranoid Ideation and Tangential  Suicidal Thoughts:  No  Homicidal Thoughts:  No  Memory:  Immediate;   Poor Recent;   Poor Remote;   Poor  Judgement:  Impaired  Insight:  Lacking  Psychomotor Activity:  Increased  Concentration:  Concentration: Poor and Attention Span: Poor  Recall:  Fis[employer]Fair  Fund of Knowledge:  Fair  Language:  Fair  Akathisia:  No  Handed:  Right  AIMS (if indicated):   N/A  Assets:  Leisure Time Physical Health Resilience Social Support  ADL's:  Intact  Cognition:  Impaired,  Moderate  Sleep:   N/A     Treatment Plan Summary: Daily contact with patient to assess and evaluate symptoms and progress in treatment, Medication management and Plan paranoid delusional disorder:  -Crisis stabilization -Medication management:  Saphris 10 mg BID for mania started, agitation medications given (Geodon, Ativan, Haldol earlier) -Individual counseling  Disposition: Recommend psychiatric Inpatient admission when medically cleared.  Wendy Berry, JAMISON, Wendy Berry 05/12/2017 2:32 PM   Patient seen face-to-face for psychiatric evaluation, chart reviewed and case discussed with the physician extender and developed treatment plan. Reviewed the information documented and agree with the treatment plan.  Juanetta BeetsJacqueline Norman, DO

## 2017-05-13 DIAGNOSIS — R441 Visual hallucinations: Secondary | ICD-10-CM | POA: Diagnosis not present

## 2017-05-13 LAB — COMPREHENSIVE METABOLIC PANEL
ALBUMIN: 3.3 g/dL — AB (ref 3.5–5.0)
ALT: 21 U/L (ref 14–54)
ANION GAP: 8 (ref 5–15)
AST: 35 U/L (ref 15–41)
Alkaline Phosphatase: 65 U/L (ref 38–126)
BILIRUBIN TOTAL: 0.7 mg/dL (ref 0.3–1.2)
BUN: 9 mg/dL (ref 6–20)
CHLORIDE: 103 mmol/L (ref 101–111)
CO2: 29 mmol/L (ref 22–32)
Calcium: 9.1 mg/dL (ref 8.9–10.3)
Creatinine, Ser: 0.7 mg/dL (ref 0.44–1.00)
GFR calc Af Amer: >60 mL/min (ref >60)
GLUCOSE: 75 mg/dL (ref 65–99)
POTASSIUM: 3.7 mmol/L (ref 3.5–5.1)
Sodium: 140 mmol/L (ref 135–145)
TOTAL PROTEIN: 7.6 g/dL (ref 6.5–8.1)

## 2017-05-13 MED ORDER — ZIPRASIDONE MESYLATE 20 MG IM SOLR
20.0000 mg | Freq: Once | INTRAMUSCULAR | Status: AC
  Administered 2017-05-13: 20 mg via INTRAMUSCULAR
  Filled 2017-05-13: qty 20

## 2017-05-13 MED ORDER — LORAZEPAM 2 MG/ML IJ SOLN
2.0000 mg | Freq: Once | INTRAMUSCULAR | Status: AC
  Administered 2017-05-13: 2 mg via INTRAMUSCULAR
  Filled 2017-05-13: qty 1

## 2017-05-13 MED ORDER — DIPHENHYDRAMINE HCL 50 MG/ML IJ SOLN
50.0000 mg | Freq: Once | INTRAMUSCULAR | Status: AC
  Administered 2017-05-13: 50 mg via INTRAMUSCULAR
  Filled 2017-05-13: qty 1

## 2017-05-13 MED ORDER — DIVALPROEX SODIUM ER 500 MG PO TB24
750.0000 mg | ORAL_TABLET | Freq: Every day | ORAL | Status: DC
  Administered 2017-05-13 – 2017-05-15 (×3): 750 mg via ORAL
  Filled 2017-05-13 (×4): qty 1

## 2017-05-13 MED ORDER — STERILE WATER FOR INJECTION IJ SOLN
INTRAMUSCULAR | Status: AC
  Administered 2017-05-13: 1.2 mL
  Filled 2017-05-13: qty 10

## 2017-05-13 MED ORDER — DOCUSATE SODIUM 100 MG PO CAPS
100.0000 mg | ORAL_CAPSULE | Freq: Two times a day (BID) | ORAL | Status: DC | PRN
Start: 2017-05-13 — End: 2017-05-16
  Administered 2017-05-13 – 2017-05-15 (×3): 100 mg via ORAL
  Filled 2017-05-13 (×4): qty 1

## 2017-05-13 NOTE — ED Notes (Signed)
Patient awake, stripping off her clothing and began to curse the sitter and make racial slurs. Pt loud and demanding. Pt also closing her door multiple times and is difficult to redirect. Pt given Ativan prn as ordered.

## 2017-05-13 NOTE — Consult Note (Signed)
[facility] Face-to-Face Psychiat[facility] Consult   Reason for Consult:  Delusional thinking Referring Physician:  EDP Patient Identification: Wendy Berry MRN:  [id number] Principal Diagnosis: Paranoid type delusional disorder [facility](HCC) Diagnosis:   Patient Active Problem List   Diagnosis Date Noted  . Hallucinations [R44.3]   . Involuntary commitment [Z04.6]   . 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 female patient admitted with delusional thinking and paranoia.  HPI:  Pt was seen and chart reviewed with treatment team and Dr Vanetta ShawlHisada. Pt presented to the [facility], under [facility]C, for paranoia and delusional thinking. Pt continues to be disorganized and refusing PO medications. Pt can have periods of irritability and pressured speech. Pt will benefit from an inpatient psychiatric admission for medication management and crisis stabilization.   Past Psychiatric History: As above  Risk to Self: Suicidal Ideation: (unable to assess) Suicidal Intent: (unable to assess) Is patient at risk for suicide?: (unable to assess) Suicidal Plan?: (unable to assess) Access to Means: (unable to assess) What has been your use of drugs/alcohol within the last 12 months?: UDS negative & etoh on board Other Self Harm Risks: unable to assess Triggers for Past Attempts: (unable to assess) Intentional Self Injurious Behavior: None Risk to Others: Homicidal Ideation: (unable to assess) Thoughts of Harm to Others: (unable to assess) Current Homicidal Intent: (unable to assess) Current Homicidal Plan: (unable to assess) Identified Victim: (unable to assess) History of harm to others?: (unable to assess) Assessment of Violence: None Noted Violent Behavior Description: unable to assess Does patient have access to weapons?: (unable to assess) Criminal Charges Pending?: Yes Describe Pending Criminal Charges:  communicating threats, reckless driving Does patient have a court date: Yes Court Date: 05/16/17 Prior Inpatient Therapy: Prior Inpatient Therapy: Yes Prior Therapy Dates: unknown Prior Therapy Facilty/Provider(s): [facility] Prior Outpatient Therapy: Prior Outpatient Therapy: Yes Prior Therapy Dates: unknown Prior Therapy Facilty/Provider(s): per chart, pt had [facility] Does patient have an ACCT team?: Unknown Does patient have Intensive In-House Services?  : No Does patient have [facility] services? : Unknown Does patient have P4CC services?: Unknown  Past Medical History:  Past Medical History:  Diagnosis Date  . Cerebral aneurysm   . Heart murmur   . Mental retardation     Past Surgical History:  Procedure Laterality Date  . BRAIN SURGERY     Family History: No family history on file. Family Psychiatric  History: Unknown Social History:  Social History   Substance and Sexual Activity  Alcohol Use No     Social History   Substance and Sexual Activity  Drug Use No    Social History   Socioeconomic History  . Marital status: Single    Spouse name: None  . Number of children: None  . Years of education: None  . Highest education level: None  Social Needs  . Financial resource strain: None  . Food insecurity - worry: None  . Food insecurity - inability: None  . Transportation needs - medical: None  . Transportation needs - non-medical: None  Occupational History  . None  Tobacco Use  . Smoking status: Never Smoker  . Smokeless tobacco: Never Used  Substance and Sexual Activity  . Alcohol use: No  . Drug use: No  . Sexual activity: None  Other Topics Concern  . None  Social History Narrative  . None  Additional Social History:    Allergies:  No Known Allergies  Labs: No results found for this or any previous visit (from the past 48 hour(s)).  Current Facility-Administered Medications  Medication Dose Route  Frequency Provider Last Rate Last Dose  . acetaminophen (TYLENOL) tablet 650 mg  650 mg Oral Q6H PRN Gerhard MunchLockwood, Robert, MD   650 mg at 05/12/17 2115  . amoxicillin (AMOXIL) capsule 500 mg  500 mg Oral Q12H Gerhard MunchLockwood, Robert, MD   500 mg at 05/12/17 2114  . asenapine (SAPHRIS) sublingual tablet 10 mg  10 mg Sublingual BID Charm RingsLord, Jamison Y, NP   10 mg at 05/12/17 2114  . benzocaine (ORAJEL) 10 % mucosal gel   Mouth/Throat QID PRN Gerhard MunchLockwood, Robert, MD      . divalproex (DEPAKOTE ER) 24 hr tablet 750 mg  750 mg Oral QHS Hisada, Reina, MD      . loratadine (CLARITIN) tablet 10 mg  10 mg Oral QHS Aleda GranaZeigler, Dustin G, RPH   10 mg at 05/12/17 2114  . LORazepam (ATIVAN) tablet 1 mg  1 mg Oral Q8H PRN Joy, Shawn C, PA-C   1 mg at 05/13/17 0245  . triamterene-hydrochlorothiazide (MAXZIDE-25) 37.5-25 MG per tablet 1 tablet  1 tablet Oral Daily Joy, Shawn C, PA-C   Stopped at 05/09/17 1824  . ziprasidone (GEODON) injection 20 mg  20 mg Intramuscular Q12H PRN Cherly Beachorman, Jacqueline J, DO   20 mg at 05/13/17 0700   Current Outpatient Medications  Medication Sig Dispense Refill  . amantadine (SYMMETREL) 100 MG capsule Take 1 capsule (100 mg total) by mouth 2 (two) times daily. 60 capsule 0  . benztropine (COGENTIN) 1 MG tablet Take 1 mg by mouth 2 (two) times daily.    . fluticasone (FLONASE) 50 MCG/ACT nasal spray Place 2 sprays into both nostrils daily. (Patient not taking: Reported on 05/09/2017) 126 g 2  . hydrocortisone 2.5 % lotion Apply topically 2 (two) times daily. (Patient not taking: Reported on 05/09/2017) 59 mL 0  . hydrocortisone cream 1 % Apply to affected area 2 times daily (Patient not taking: Reported on 05/09/2017) 15 g 0  . levocetirizine (XYZAL) 5 MG tablet Take 1 tablet (5 mg total) by mouth every evening. (Patient not taking: Reported on 05/09/2017) 30 tablet 0  . LORazepam (ATIVAN) 1 MG tablet Take 1 tablet (1 mg total) by mouth every 8 (eight) hours as needed for anxiety (agitation). (Patient not  taking: Reported on 05/09/2017) 30 tablet 0  . Multiple Vitamin (MULTIVITAMIN WITH MINERALS) TABS tablet Take 1 tablet by mouth daily.    . ondansetron (ZOFRAN-ODT) 8 MG disintegrating tablet Take 1 tablet (8 mg total) by mouth every 8 (eight) hours as needed for nausea. (Patient not taking: Reported on 05/09/2017) 10 tablet 0  . Oxcarbazepine (TRILEPTAL) 300 MG tablet Take 1 tablet (300 mg total) by mouth 2 (two) times daily. (Patient not taking: Reported on 05/09/2017) 60 tablet 0  . paliperidone (INVEGA SUSTENNA) 234 MG/1.5ML SUSP injection Inject 234 mg into the muscle every 28 (twenty-eight) days.    . paliperidone (INVEGA) 3 MG 24 hr tablet Take 1 tablet (3 mg total) by mouth daily. (Patient not taking: Reported on 05/09/2017) 30 tablet 0  . polyvinyl alcohol (LIQUIFILM TEARS) 1.4 % ophthalmic solution Place 1 drop into both eyes as needed for dry eyes.    Marland Kitchen. risperiDONE (RISPERDAL) 2 MG tablet Take 6 mg by mouth at bedtime.    . topiramate (TOPAMAX) 50 MG tablet Take 50  mg by mouth 3 (three) times daily.  4  . triamterene-hydrochlorothiazide (MAXZIDE-25) 37.5-25 MG tablet Take 1 tablet by mouth daily. (Patient not taking: Reported on 05/09/2017) 30 tablet 11  . trimethoprim-polymyxin b (POLYTRIM) ophthalmic solution Place 2 drops into both eyes every 4 (four) hours. (Patient not taking: Reported on 05/09/2017) 10 mL 0    Musculoskeletal: Strength & Muscle Tone: within normal limits Gait & Station: normal Patient leans: N/A  Psychiatric Specialty Exam: Physical Exam  Constitutional: She appears well-developed and well-nourished.  HENT:  Head: Normocephalic.  Respiratory: Effort normal.  Musculoskeletal: Normal range of motion.  Neurological: She is alert.  Psychiatric: Her affect is labile. Her speech is rapid and/or pressured. She is agitated. Thought content is paranoid and delusional. Cognition and memory are impaired. She expresses impulsivity.    Review of Systems   Psychiatric/Behavioral: Positive for depression and hallucinations. Negative for memory loss, substance abuse and suicidal ideas. The patient is nervous/anxious. The patient does not have insomnia.   All other systems reviewed and are negative.   Blood pressure (!) 177/111, pulse (!) 111, temperature 97.9 F (36.6 C), temperature source Oral, resp. rate 16, SpO2 100 %.There is no height or weight on file to calculate BMI.  General Appearance: Casual  Eye Contact:  Good  Speech:  Blocked and Pressured  Volume:  Normal  Mood:  Anxious, Depressed and Irritable  Affect:  Congruent, Depressed and Labile  Thought Process:  Disorganized  Orientation:  Full (Time, Place, and Person)  Thought Content:  Illogical, Delusions, Ideas of Reference:   Paranoia Delusions, Rumination and Tangential  Suicidal Thoughts:  No  Homicidal Thoughts:  No  Memory:  Immediate;   Good Recent;   Good Remote;   Fair  Judgement:  Poor  Insight:  Lacking  Psychomotor Activity:  Increased  Concentration:  Concentration: Fair and Attention Span: Fair  Recall:  Poor  Fund of Knowledge:  Fair  Language:  Good  Akathisia:  No  Handed:  Right  AIMS (if indicated):     Assets:  Architect Housing Resilience  ADL's:  Intact  Cognition:  WNL  Sleep:        Treatment Plan Summary: Daily contact with patient to assess and evaluate symptoms and progress in treatment and Medication management (see MAR)  Disposition: Recommend psychiatric Inpatient admission when medically cleared. TTS to seek placement.   Laveda Abbe, NP 05/13/2017 12:48 PM

## 2017-05-13 NOTE — ED Notes (Signed)
Patient approaching the desk multiple times with multiple somatic complaints. Pt with disorganized speech and is needing frequent redirection to remain in her room. Pt continues to make racial slurs to staff members without provocation. No distress noted at this time. Pt given snacks and soda.

## 2017-05-13 NOTE — ED Notes (Signed)
Patient yelling and cursing in her room and is paranoid of sitters and this Clinical research associatewriter. Pt unable to be redirected and continues to threaten staff saying "Get your white fucking hands away from me! I don't want your white ass near me!". Pt then pushing her bedside table. Pt given Geodon prn as ordered, with security in the room to assist.

## 2017-05-13 NOTE — ED Notes (Signed)
Patient refused vitals.

## 2017-05-13 NOTE — Progress Notes (Signed)
Per Psychiatrist Hisada and NP [facility], patient meets inpatient criteria. CS[facility] and [facility] at [facility]. CSW contacted [facility], per TTS staff no beds available at this time. CSW faxed patient's referral to: [facility], [facility], [facility], [facility], [facility], [facility] and [facility].  Wendy SickleKimberly Jeffrie Berry, LCSWA [facility]  Clinical Social Worker [phone]

## 2017-05-13 NOTE — ED Notes (Signed)
Patient remains agitated and approaching desk multiple times. Pt talking loudly and cursing and needing redirection multiple times. Pt with disorganized thoughts and remains delusional regarding other's teeth. Pt states "All these ugly bitch ass people with their fake teeth need to go to hell". Sitter remains at bedside for safety.

## 2017-05-14 DIAGNOSIS — R441 Visual hallucinations: Secondary | ICD-10-CM | POA: Diagnosis not present

## 2017-05-14 NOTE — ED Notes (Signed)
Patient coming up to the nursing station multiple times and making delusional statements about staff members. Pt pointing at the sitter and a female nurse tech stating "They better stay the hell out of my neighborhood and quit following my ass! I got pictures of them watching me!". Pt needing frequent redirection due to her loudly cursing to herself in her room and being disruptive on the unit. Sitter remains at the bedside for safety.

## 2017-05-14 NOTE — ED Notes (Signed)
When administering the patients medications, she spit them into her cup of ginger ale.  I instructed patient that she needed to finish her whole cup of ginger ale and swallow her pills.

## 2017-05-14 NOTE — Consult Note (Signed)
[facility] Psych ED Progress Note  05/14/2017 12:10 PM Wendy Berry  MRN:  [id number] Subjective:   She is a poor historian and needs frequent redirection due to disorganization. Patient states that she is doing well. She asks when she can be discharged. She did not have any side effect form medication. She talks about [employer] and talks about somebody stalking her. She feels safe in the unit. She denies SI, HI. She denies AH, VH. Of note, she asks not to be referred to [facility], vaguely admitting that she was at [facility] years ago.   Principal Problem: Paranoid type delusional disorder ([place]) Diagnosis:   Patient Active Problem List   Diagnosis Date Noted  . Hallucinations [R44.3]   . Involuntary commitment [Z04.6]   . 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 Cerebral aneurysm   . Heart murmur   . Mental retardation     Past Surgical History:  Procedure Laterality Date  . BRAIN SURGERY     Family History: No family history on file. Family Psychiatric  History: see initial Eval Social History:  Social History   Substance and Sexual Activity  Alcohol Use No     Social History   Substance and Sexual Activity  Drug Use No    Social History   Socioeconomic History  . Marital status: Single    Spouse name: None  . Number of children: None  . Years of education: None  . Highest education level: None  Social Needs  . Financial resource strain: None  . Food insecurity - worry: None  . Food insecurity - inability: None  . Transportation needs - medical: None  . Transportation needs - non-medical: None  Occupational History  . None  Tobacco Use  . Smoking status: Never Smoker  . Smokeless tobacco: Never Used  Substance and Sexual Activity  . Alcohol use: No  . Drug use: No  . Sexual activity: None  Other  Topics Concern  . None  Social History Narrative  . None    Sleep: Fair  Appetite:  Good  Current Medications: Current Facility-Administered Medications  Medication Dose Route Frequency Provider Last Rate Last Dose  . acetaminophen (TYLENOL) tablet 650 mg  650 mg Oral Q6H PRN Carmin Muskrat, MD   650 mg at 05/14/17 0856  . amoxicillin (AMOXIL) capsule 500 mg  500 mg Oral Q12H Carmin Muskrat, MD   500 mg at 05/14/17 0946  . asenapine (SAPHRIS) sublingual tablet 10 mg  10 mg Sublingual BID Patrecia Pour, NP   10 mg at 05/14/17 0946  . benzocaine (ORAJEL) 10 % mucosal gel   Mouth/Throat QID PRN Carmin Muskrat, MD      . divalproex (DEPAKOTE ER) 24 hr tablet 750 mg  750 mg Oral QHS Norman Clay, MD   750 mg at 05/13/17 2043  . docusate sodium (COLACE) capsule 100 mg  100 mg Oral BID PRN Virgel Manifold, MD   100 mg at 05/13/17 1642  . loratadine (CLARITIN) tablet 10 mg  10 mg Oral QHS Berton Mount, RPH   10 mg at 05/13/17 2045  . LORazepam (ATIVAN) tablet 1 mg  1 mg Oral Q8H PRN Joy, Shawn C, PA-C   1 mg at 05/14/17 0856  . triamterene-hydrochlorothiazide (MAXZIDE-25) 37.5-25 MG per tablet 1 tablet  1 tablet Oral Daily  Arlean Hopping C, PA-C   1 tablet at 05/14/17 0947  . ziprasidone (GEODON) injection 20 mg  20 mg Intramuscular Q12H PRN Faythe Dingwall, DO   20 mg at 05/13/17 0700   Current Outpatient Medications  Medication Sig Dispense Refill  . amantadine (SYMMETREL) 100 MG capsule Take 1 capsule (100 mg total) by mouth 2 (two) times daily. 60 capsule 0  . benztropine (COGENTIN) 1 MG tablet Take 1 mg by mouth 2 (two) times daily.    . fluticasone (FLONASE) 50 MCG/ACT nasal spray Place 2 sprays into both nostrils daily. (Patient not taking: Reported on 05/09/2017) 126 g 2  . hydrocortisone 2.5 % lotion Apply topically 2 (two) times daily. (Patient not taking: Reported on 05/09/2017) 59 mL 0  . hydrocortisone cream 1 % Apply to affected area 2 times daily (Patient not taking:  Reported on 05/09/2017) 15 g 0  . levocetirizine (XYZAL) 5 MG tablet Take 1 tablet (5 mg total) by mouth every evening. (Patient not taking: Reported on 05/09/2017) 30 tablet 0  . LORazepam (ATIVAN) 1 MG tablet Take 1 tablet (1 mg total) by mouth every 8 (eight) hours as needed for anxiety (agitation). (Patient not taking: Reported on 05/09/2017) 30 tablet 0  . Multiple Vitamin (MULTIVITAMIN WITH MINERALS) TABS tablet Take 1 tablet by mouth daily.    . ondansetron (ZOFRAN-ODT) 8 MG disintegrating tablet Take 1 tablet (8 mg total) by mouth every 8 (eight) hours as needed for nausea. (Patient not taking: Reported on 05/09/2017) 10 tablet 0  . Oxcarbazepine (TRILEPTAL) 300 MG tablet Take 1 tablet (300 mg total) by mouth 2 (two) times daily. (Patient not taking: Reported on 05/09/2017) 60 tablet 0  . paliperidone (INVEGA SUSTENNA) 234 MG/1.5ML SUSP injection Inject 234 mg into the muscle every 28 (twenty-eight) days.    . paliperidone (INVEGA) 3 MG 24 hr tablet Take 1 tablet (3 mg total) by mouth daily. (Patient not taking: Reported on 05/09/2017) 30 tablet 0  . polyvinyl alcohol (LIQUIFILM TEARS) 1.4 % ophthalmic solution Place 1 drop into both eyes as needed for dry eyes.    Marland Kitchen risperiDONE (RISPERDAL) 2 MG tablet Take 6 mg by mouth at bedtime.    . topiramate (TOPAMAX) 50 MG tablet Take 50 mg by mouth 3 (three) times daily.  4  . triamterene-hydrochlorothiazide (MAXZIDE-25) 37.5-25 MG tablet Take 1 tablet by mouth daily. (Patient not taking: Reported on 05/09/2017) 30 tablet 11  . trimethoprim-polymyxin b (POLYTRIM) ophthalmic solution Place 2 drops into both eyes every 4 (four) hours. (Patient not taking: Reported on 05/09/2017) 10 mL 0    Lab Results:  Results for orders placed or performed during the hospital encounter of 05/09/17 (from the past 48 hour(s))  Comprehensive metabolic panel     Status: Abnormal   Collection Time: 05/13/17  2:40 PM  Result Value Ref Range   Sodium 140 135 - 145 mmol/L    Potassium 3.7 3.5 - 5.1 mmol/L   Chloride 103 101 - 111 mmol/L   CO2 29 22 - 32 mmol/L   Glucose, Bld 75 65 - 99 mg/dL   BUN 9 6 - 20 mg/dL   Creatinine, Ser 0.70 0.44 - 1.00 mg/dL   Calcium 9.1 8.9 - 10.3 mg/dL   Total Protein 7.6 6.5 - 8.1 g/dL   Albumin 3.3 (L) 3.5 - 5.0 g/dL   AST 35 15 - 41 U/L   ALT 21 14 - 54 U/L   Alkaline Phosphatase 65 38 - 126 U/L  Total Bilirubin 0.7 0.3 - 1.2 mg/dL   GFR calc non Af Amer >60 >60 mL/min   GFR calc Af Amer >60 >60 mL/min    Comment: (NOTE) The eGFR has been calculated using the CKD EPI equation. This calculation has not been validated in all clinical situations. eGFR's persistently <60 mL/min signify possible Chronic Kidney Disease.    Anion gap 8 5 - 15    Blood Alcohol level:  Lab Results  Component Value Date   ETH <10 05/09/2017   ETH <5 04/29/2016    Physical Findings: AIMS:  , ,  ,  ,    CIWA:    COWS:     Musculoskeletal: Strength & Muscle Tone: within normal limits Gait & Station: normal Patient leans: N/A  Psychiatric Specialty Exam: Physical Exam agree with ED eval  Review of Systems  Unable to perform ROS: Mental acuity    Blood pressure (!) 161/108, pulse 94, temperature 98 F (36.7 C), temperature source Oral, resp. rate 17, SpO2 100 %.There is no height or weight on file to calculate BMI.  General Appearance: Fairly Groomed  Eye Contact:  Good  Speech:  Clear and Coherent and Pressured  Volume:  Normal  Mood:  "great"  Affect:  Labile  Thought Process:  Disorganized  Orientation:  Full (Time, Place, and Person)  Thought Content:  Delusions and Paranoid Ideation  Suicidal Thoughts:  No  Homicidal Thoughts:  No  Memory:  Immediate;   unable to assess due to current mental status Recent;   unable to assess due to current mental status Remote;   unable to assess due to current mental status  Judgement:  Impaired  Insight:  Lacking  Psychomotor Activity:  Increased  Concentration:  Concentration:  Poor and Attention Span: Poor  Recall:  unable to assess due to current mental status  Fund of Knowledge:  unable to assess due to current mental status  Language:  Good  Akathisia:  No  Handed:  Right  AIMS (if indicated):     Assets:  Physical Health  ADL's:  Intact  Cognition:  WNL  Sleep:      Assessment Wendy Berry is a 31 year old female with delusional disorder, r/o bipolar disorder who was brought to ED under IVC due to throwing rocks, yelling and screaming at the dentist office.   Exam is notable for pressured speech and is significantly disorganized. She meets criteria for IVC given gravely disabled. She was started on Depakote for mood stabilization/aggression and she tolerates it well. Will continue Depakote. Continue asenapine for psychosis.   Plan - Continue Depakote 750 mg at night; consider checking level in five days and uptitration as indicated - Continue asenapine 10 mg BID - Continue lorazepam 1 mg qhprn for anxiety   Treatment Plan Summary: Daily contact with patient to assess and evaluate symptoms and progress in treatment  Norman Clay, MD 05/14/2017, 12:10 PM

## 2017-05-14 NOTE — Progress Notes (Signed)
Per Psychiatrist [place] and NP [facility], patient continues to meet inpatient criteria. CSW contacted that following facilities to follow up on patient's referral.  [facility] - no answer  [facility] - staff reported that no assessment RN was present and requested that CSW call back later today.  [facility] - No answer  [facility] - staff reported that patient's referral has to be reviewed and agreed to contact CSW with an update.  [facility] - staff member Gala Murdochanisha reported that patient was declined due to acuity.  [facility] - at capacity.  [facility] - no answer  CS[facility].   Celso SickleKimberly Esabella Stockinger, LCSWA [facility]  Clinical Social Worker [phone]

## 2017-05-15 DIAGNOSIS — R441 Visual hallucinations: Secondary | ICD-10-CM | POA: Diagnosis not present

## 2017-05-15 LAB — PREGNANCY, URINE: PREG TEST UR: NEGATIVE

## 2017-05-15 NOTE — ED Notes (Signed)
ED Provider at bedside.  psych 

## 2017-05-15 NOTE — ED Notes (Signed)
Requested a urine sample. 

## 2017-05-15 NOTE — Consult Note (Addendum)
[place] Psychiatry Consult   Reason for Consult: Paranoia  Referring Physician:  EDP Patient Identification: Wendy Berry MRN:  [id number] Principal Diagnosis: Paranoid type delusional disorder [facility]) Diagnosis:   Patient Active Problem List   Diagnosis Date Noted  . Hallucinations [R44.3]   . Involuntary commitment [Z04.6]   . 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 female patient admitted with paranoid delusions.  HPI:  Pt was seen and chart reviewed with treatment team and Dr Darleene Cleaver. Pt continues to be disorganized, tangential, and paranoid. Pt believes he is being cyber-stalked and that multiple people are after her. Pt stated she does not have any mental health issues, need any medications, and has never had an inpatient psychiatric admission. Pt has been medication compliant in the ED but has periods of agitation and anger and requires PRN medications. Pt would benefit from an inpatient psychiatric admission for crisis stabilization and medication management.   Past Psychiatric History: As above  Risk to Self: None Risk to Others: None Prior Inpatient Therapy: Prior Inpatient Therapy: Yes Prior Therapy Dates: unknown Prior Therapy Facilty/Provider(s): pt sts she went to [facility] Prior Outpatient Therapy: Prior Outpatient Therapy: Yes Prior Therapy Dates: unknown Prior Therapy Facilty/Provider(s): per chart, pt had [facility] Does patient have an ACCT team?: Unknown Does patient have Intensive In-House Services?  : No Does patient have [facility] services? : Unknown Does patient have P4CC services?: Unknown  Past Medical History:  Past Medical History:  Diagnosis Date  . Cerebral aneurysm   . Heart murmur   . Mental retardation     Past Surgical History:  Procedure Laterality Date  . BRAIN SURGERY     Family History: No family  history on file. Family Psychiatric  History: Unknown Social History:  Social History   Substance and Sexual Activity  Alcohol Use No     Social History   Substance and Sexual Activity  Drug Use No    Social History   Socioeconomic History  . Marital status: Single    Spouse name: None  . Number of children: None  . Years of education: None  . Highest education level: None  Social Needs  . Financial resource strain: None  . Food insecurity - worry: None  . Food insecurity - inability: None  . Transportation needs - medical: None  . Transportation needs - non-medical: None  Occupational History  . None  Tobacco Use  . Smoking status: Never Smoker  . Smokeless tobacco: Never Used  Substance and Sexual Activity  . Alcohol use: No  . Drug use: No  . Sexual activity: None  Other Topics Concern  . None  Social History Narrative  . None   Additional Social History:    Allergies:  No Known Allergies  Labs:  Results for orders placed or performed during the hospital encounter of 05/09/17 (from the past 48 hour(s))  Comprehensive metabolic panel     Status: Abnormal   Collection Time: 05/13/17  2:40 PM  Result Value Ref Range   Sodium 140 135 - 145 mmol/L   Potassium 3.7 3.5 - 5.1 mmol/L   Chloride 103 101 - 111 mmol/L   CO2 29 22 - 32 mmol/L   Glucose, Bld 75 65 - 99 mg/dL   BUN 9 6 - 20 mg/dL   Creatinine, Ser 0.70 0.44 - 1.00 mg/dL   Calcium  9.1 8.9 - 10.3 mg/dL   Total Protein 7.6 6.5 - 8.1 g/dL   Albumin 3.3 (L) 3.5 - 5.0 g/dL   AST 35 15 - 41 U/L   ALT 21 14 - 54 U/L   Alkaline Phosphatase 65 38 - 126 U/L   Total Bilirubin 0.7 0.3 - 1.2 mg/dL   GFR calc non Af Amer >60 >60 mL/min   GFR calc Af Amer >60 >60 mL/min    Comment: (NOTE) The eGFR has been calculated using the CKD EPI equation. This calculation has not been validated in all clinical situations. eGFR's persistently <60 mL/min signify possible Chronic Kidney Disease.    Anion gap 8 5 - 15   Pregnancy, urine     Status: None   Collection Time: 05/15/17 10:25 AM  Result Value Ref Range   Preg Test, Ur NEGATIVE NEGATIVE    Comment:        THE SENSITIVITY OF THIS METHODOLOGY IS >20 mIU/mL.     Current Facility-Administered Medications  Medication Dose Route Frequency Provider Last Rate Last Dose  . acetaminophen (TYLENOL) tablet 650 mg  650 mg Oral Q6H PRN Carmin Muskrat, MD   650 mg at 05/14/17 1436  . amoxicillin (AMOXIL) capsule 500 mg  500 mg Oral Q12H Carmin Muskrat, MD   500 mg at 05/15/17 1021  . asenapine (SAPHRIS) sublingual tablet 10 mg  10 mg Sublingual BID Patrecia Pour, NP   10 mg at 05/15/17 1021  . benzocaine (ORAJEL) 10 % mucosal gel   Mouth/Throat QID PRN Carmin Muskrat, MD   1 application at 81/44/81 1021  . divalproex (DEPAKOTE ER) 24 hr tablet 750 mg  750 mg Oral QHS Norman Clay, MD   750 mg at 05/14/17 2113  . docusate sodium (COLACE) capsule 100 mg  100 mg Oral BID PRN Virgel Manifold, MD   100 mg at 05/13/17 1642  . loratadine (CLARITIN) tablet 10 mg  10 mg Oral QHS Berton Mount, RPH   10 mg at 05/14/17 2114  . LORazepam (ATIVAN) tablet 1 mg  1 mg Oral Q8H PRN Joy, Shawn C, PA-C   1 mg at 05/15/17 1021  . triamterene-hydrochlorothiazide (MAXZIDE-25) 37.5-25 MG per tablet 1 tablet  1 tablet Oral Daily Joy, Shawn C, PA-C   1 tablet at 05/15/17 1021  . ziprasidone (GEODON) injection 20 mg  20 mg Intramuscular Q12H PRN Faythe Dingwall, DO   20 mg at 05/13/17 0700   Current Outpatient Medications  Medication Sig Dispense Refill  . amantadine (SYMMETREL) 100 MG capsule Take 1 capsule (100 mg total) by mouth 2 (two) times daily. 60 capsule 0  . benztropine (COGENTIN) 1 MG tablet Take 1 mg by mouth 2 (two) times daily.    . fluticasone (FLONASE) 50 MCG/ACT nasal spray Place 2 sprays into both nostrils daily. (Patient not taking: Reported on 05/09/2017) 126 g 2  . hydrocortisone 2.5 % lotion Apply topically 2 (two) times daily. (Patient not  taking: Reported on 05/09/2017) 59 mL 0  . hydrocortisone cream 1 % Apply to affected area 2 times daily (Patient not taking: Reported on 05/09/2017) 15 g 0  . levocetirizine (XYZAL) 5 MG tablet Take 1 tablet (5 mg total) by mouth every evening. (Patient not taking: Reported on 05/09/2017) 30 tablet 0  . LORazepam (ATIVAN) 1 MG tablet Take 1 tablet (1 mg total) by mouth every 8 (eight) hours as needed for anxiety (agitation). (Patient not taking: Reported on 05/09/2017) 30 tablet 0  .  Multiple Vitamin (MULTIVITAMIN WITH MINERALS) TABS tablet Take 1 tablet by mouth daily.    . ondansetron (ZOFRAN-ODT) 8 MG disintegrating tablet Take 1 tablet (8 mg total) by mouth every 8 (eight) hours as needed for nausea. (Patient not taking: Reported on 05/09/2017) 10 tablet 0  . Oxcarbazepine (TRILEPTAL) 300 MG tablet Take 1 tablet (300 mg total) by mouth 2 (two) times daily. (Patient not taking: Reported on 05/09/2017) 60 tablet 0  . paliperidone (INVEGA SUSTENNA) 234 MG/1.5ML SUSP injection Inject 234 mg into the muscle every 28 (twenty-eight) days.    . paliperidone (INVEGA) 3 MG 24 hr tablet Take 1 tablet (3 mg total) by mouth daily. (Patient not taking: Reported on 05/09/2017) 30 tablet 0  . polyvinyl alcohol (LIQUIFILM TEARS) 1.4 % ophthalmic solution Place 1 drop into both eyes as needed for dry eyes.    Marland Kitchen risperiDONE (RISPERDAL) 2 MG tablet Take 6 mg by mouth at bedtime.    . topiramate (TOPAMAX) 50 MG tablet Take 50 mg by mouth 3 (three) times daily.  4  . triamterene-hydrochlorothiazide (MAXZIDE-25) 37.5-25 MG tablet Take 1 tablet by mouth daily. (Patient not taking: Reported on 05/09/2017) 30 tablet 11  . trimethoprim-polymyxin b (POLYTRIM) ophthalmic solution Place 2 drops into both eyes every 4 (four) hours. (Patient not taking: Reported on 05/09/2017) 10 mL 0    Musculoskeletal: Strength & Muscle Tone: within normal limits Gait & Station: normal Patient leans: N/A  Psychiatric Specialty Exam: Physical  Exam  Constitutional: She appears well-developed and well-nourished.  HENT:  Head: Normocephalic.  Respiratory: Effort normal.  Musculoskeletal: Normal range of motion.  Neurological: She is alert.  Psychiatric: Her mood appears anxious. Her affect is labile. Her speech is rapid and/or pressured. She is agitated. Thought content is paranoid and delusional. Cognition and memory are normal. She expresses impulsivity.    ROS  Blood pressure (!) 153/81, pulse (!) 106, temperature 98.3 F (36.8 C), temperature source Oral, resp. rate 18, SpO2 100 %.There is no height or weight on file to calculate BMI.  General Appearance: Fairly Groomed  Eye Contact:  Good  Speech:  Pressured  Volume:  Normal  Mood:  Anxious, Depressed and Irritable  Affect:  Labile  Thought Process:  Disorganized  Orientation:  Full (Time, Place, and Person)  Thought Content:  Illogical, Ideas of Reference:   Paranoia Delusions, Rumination and Tangential  Suicidal Thoughts:  No  Homicidal Thoughts:  No  Memory:  Immediate;   Fair Recent;   Fair Remote;   Fair  Judgement:  Poor  Insight:  Lacking  Psychomotor Activity:  Restlessness  Concentration:  Concentration: Poor and Attention Span: Poor  Recall:  [employer] of Knowledge:  Fair  Language:  Good  Akathisia:  No  Handed:  Right  AIMS (if indicated):     Assets:  Physical Health  ADL's:  Intact  Cognition:  WNL  Sleep:        Treatment Plan Summary: Daily contact with patient to assess and evaluate symptoms and progress in treatment and Medication management   Plan - Continue Depakote 750 mg at night; consider checking level in five days and uptitration as indicated - Continue asenapine 10 mg BID - Continue lorazepam 1 mg qhprn for anxiety     Disposition: Recommend psychiatric Inpatient admission when medically cleared.  Ethelene Hal, NP 05/15/2017 12:14 PM  Patient seen face-to-face for psychiatric evaluation, chart reviewed and  case discussed with the physician extender and developed treatment plan. Reviewed  the information documented and agree with the treatment plan. Corena Pilgrim, MD

## 2017-05-15 NOTE — ED Notes (Signed)
Pt is paranoid, thinks people are cyberstalking her

## 2017-05-15 NOTE — ED Notes (Signed)
MS [facility]. REQUESTING PT HAVE CASE MANAGER INCLUDE HOME VISITS TO INSURE PT IS TAKING HER MEDICATIONS AND HAS A PART TIME JOB.

## 2017-05-15 NOTE — ED Notes (Signed)
Pt is responding to internal stimuli and getting worked up.  Pacing in room and yelling and cussing in bathroom mirror, offered a geodon shot; pt declined

## 2017-05-16 DIAGNOSIS — R4587 Impulsiveness: Secondary | ICD-10-CM

## 2017-05-16 DIAGNOSIS — R443 Hallucinations, unspecified: Secondary | ICD-10-CM

## 2017-05-16 DIAGNOSIS — F22 Delusional disorders: Secondary | ICD-10-CM | POA: Diagnosis not present

## 2017-05-16 DIAGNOSIS — R441 Visual hallucinations: Secondary | ICD-10-CM | POA: Diagnosis not present

## 2017-05-16 NOTE — ED Notes (Signed)
ED Provider at bedside.  Psych  Pt will be sent home to her mother this afternoon  Awaiting d/c orders

## 2017-05-16 NOTE — ED Notes (Signed)
Pt did not sleep last night and seems anxious this am, offered medication but pt declined; pt is responding to internal stimuli and seems paranoid as well

## 2017-05-16 NOTE — [facility] Suicide Risk Assessment (Signed)
Suicide Risk Assessment  Discharge Assessment   [facility]   Principal Problem: Paranoid type delusional disorder [facility](HCC) Discharge Diagnoses:  Patient Active Problem List   Diagnosis Date Noted  . Hallucinations [R44.3]   . Involuntary commitment [Z04.6]   . Paranoid type delusional disorder ([facility]) [F22] 04/28/2015    Total Time spent with patient: 45 minutes  Musculoskeletal: Strength & Muscle Tone: within normal limits Gait & Station: normal Patient leans: N/A  Psychiatric Specialty Exam: Physical Exam  Constitutional: She is oriented to person, place, and time. She appears well-developed and well-nourished.  HENT:  Head: Normocephalic.  Musculoskeletal: Normal range of motion.  Neurological: She is alert and oriented to person, place, and time.  Psychiatric: She has a normal mood and affect. Her speech is normal and behavior is normal. Thought content is paranoid. Thought content is not delusional. Cognition and memory are impaired. She expresses impulsivity.   Review of Systems  Psychiatric/Behavioral: Positive for depression. Negative for hallucinations, memory loss, substance abuse and suicidal ideas. The patient is not nervous/anxious and does not have insomnia.   All other systems reviewed and are negative.  Blood pressure (!) 147/94, pulse (!) 107, temperature 97.9 F (36.6 C), temperature source Axillary, resp. rate 16, SpO2 100 %.There is no height or weight on file to calculate BMI. General Appearance: Casual and Fairly Groomed Eye Contact:  Good Speech:  Clear and Coherent and Normal Rate Volume:  Normal Mood:  Euthymic Affect:  Congruent Thought Process:  Coherent Orientation:  Full (Time, Place, and Person) Thought Content:  Logical Suicidal Thoughts:  No Homicidal Thoughts:  No Memory:  Immediate;   Good Recent;   Good Remote;   Fair Judgement:  Poor Insight:  Lacking Psychomotor Activity:  Normal Concentration:   Concentration: Good and Attention Span: Good Recall:  Good Fund of Knowledge:  Good Language:  Good Akathisia:  No Handed:  Right AIMS (if indicated):    Assets:  ArchitectCommunication Skills Financial Resources/Insurance Housing ADL's:  Intact Cognition:  WNL   Mental Status Per Nursing Assessment::   On Admission:   Paranoid and delusional  Demographic Factors:  Low socioeconomic status and Unemployed  Loss Factors: Financial problems/change in socioeconomic status  Historical Factors: Impulsivity  Risk Reduction Factors:   Sense of responsibility to family  Continued Clinical Symptoms:  Bipolar Disorder:   Mixed State  Cognitive Features That Contribute To Risk:  Closed-mindedness    Suicide Risk:  Minimal: No identifiable suicidal ideation.  Patients presenting with no risk factors but with morbid ruminations; may be classified as minimal risk based on the severity of the depressive symptoms  Follow-up Information    Lewayne Buntingrenshaw, Brian S, MD.   Specialty:  Cardiology Why:  As soon as possible for follow up Contact information: [address] [place] [address] [phone]           Plan Of Care/Follow-up recommendations:  Activity:  as tolerated Diet:  Heart Healthy  Laveda AbbeLaurie Britton Parks, NP 05/16/2017, 12:44 PM

## 2017-05-16 NOTE — BH Assessment (Addendum)
[facility] Assessment Progress Note  Per Thedore MinsMojeed Akintayo, MD, this pt does not require psychiatric hospitalization at this time.  Pt presents under IVC initiated by EDP Azalia BilisKevin Campos, which expires today.  [facility].  This has been included in pt's discharge instructions.  Pt's nurse has been notified.  Doylene Canninghomas Kayanna Mckillop, MA Triage Specialist [phone]

## 2017-05-16 NOTE — ED Notes (Signed)
Called pts mom to let her know pts being d/ced today and will be given a bus pass to come home

## 2017-05-16 NOTE — Discharge Instructions (Signed)
Some EKG abnormalities noted. Please follow up with cardiology on this matter.   FOR YOUR BEHAVIORAL HEALTH NEEDS:  You are advised to follow up with [facility].  New and returning patients are seen at their walk-in clinic.  Walk-in hours are Monday - Friday from 8:00 am - 3:00 pm.  Walk-in patients are seen on a first come, first served basis.  Try to arrive as early as possible for he best chance of being seen the same day:       [facility]      [address]      [place], [place] [address]      [phone]

## 2017-05-16 NOTE — Consult Note (Signed)
[facility] Face-to-Face Psychiatry Consult   Reason for Consult:  Paranoid behavior Referring Physician:  EDP Patient Identification: Wendy Berry MRN:  [id number] Principal Diagnosis: Paranoid type delusional disorder [facility]) Diagnosis:   Patient Active Problem List   Diagnosis Date Noted  . Hallucinations [R44.3]   . Involuntary commitment [Z04.6]   . 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 female patient admitted with .  HPI:  Pt was seen and chart reviewed with treatment team and Dr Jannifer Franklin. Pt stated she is feeling much better today and is ready to be discharged home. Pt lives with her mother. Pt has been calm and cooperative and has been medication compliant. Pt goes to [facility] for medication management and has agreed to follow up there upon discharge.  Today, Pt denies suicidal/homicidal ideation, denies auditory/visual hallucinations and does not appear to be responding to internal stimuli.  Pt is stable and psychiatrically clear for discharge.   Past Psychiatric History: As above  Risk to Self: None Risk to Others: None Prior Inpatient Therapy: Prior Inpatient Therapy: Yes Prior Therapy Dates: unknown Prior Therapy Facilty/Provider(s): pt sts she went to [facility] Prior Outpatient Therapy: Prior Outpatient Therapy: Yes Prior Therapy Dates: unknown Prior Therapy Facilty/Provider(s): per chart, pt had [facility] Does patient have an ACCT team?: Unknown Does patient have Intensive In-House Services?  : No Does patient have [facility] services? : Unknown Does patient have P4CC services?: Unknown  Past Medical History:  Past Medical History:  Diagnosis Date  . Cerebral aneurysm   . Heart murmur   . Mental retardation     Past Surgical History:  Procedure Laterality Date  . BRAIN SURGERY     Family History: No family  history on file. Family Psychiatric  History: Unknown Social History:  Social History   Substance and Sexual Activity  Alcohol Use No     Social History   Substance and Sexual Activity  Drug Use No    Social History   Socioeconomic History  . Marital status: Single    Spouse name: None  . Number of children: None  . Years of education: None  . Highest education level: None  Social Needs  . Financial resource strain: None  . Food insecurity - worry: None  . Food insecurity - inability: None  . Transportation needs - medical: None  . Transportation needs - non-medical: None  Occupational History  . None  Tobacco Use  . Smoking status: Never Smoker  . Smokeless tobacco: Never Used  Substance and Sexual Activity  . Alcohol use: No  . Drug use: No  . Sexual activity: None  Other Topics Concern  . None  Social History Narrative  . None   Additional Social History:    Allergies:  No Known Allergies  Labs:  Results for orders placed or performed during the hospital encounter of 05/09/17 (from the past 48 hour(s))  Pregnancy, urine     Status: None   Collection Time: 05/15/17 10:25 AM  Result Value Ref Range   Preg Test, Ur NEGATIVE NEGATIVE    Comment:        THE SENSITIVITY OF THIS METHODOLOGY IS >20 mIU/mL.     Current Facility-Administered Medications  Medication Dose Route Frequency Provider Last Rate Last Dose  . acetaminophen (TYLENOL) tablet 650 mg  650 mg Oral Q6H PRN Gerhard Munch,  MD   650 mg at 05/16/17 0812  . amoxicillin (AMOXIL) capsule 500 mg  500 mg Oral Q12H Gerhard MunchLockwood, Robert, MD   500 mg at 05/16/17 [id number]  . asenapine (SAPHRIS) sublingual tablet 10 mg  10 mg Sublingual BID Charm RingsLord, Jamison Y, NP   10 mg at 05/16/17 [id number]  . benzocaine (ORAJEL) 10 % mucosal gel   Mouth/Throat QID PRN Gerhard MunchLockwood, Robert, MD   1 application at 05/16/17 [phone]  . divalproex (DEPAKOTE ER) 24 hr tablet 750 mg  750 mg Oral QHS Hisada, Barbee Cougheina, MD   750 mg at 05/15/17 2149  .  docusate sodium (COLACE) capsule 100 mg  100 mg Oral BID PRN Raeford RazorKohut, [name], MD   100 mg at 05/15/17 2150  . loratadine (CLARITIN) tablet 10 mg  10 mg Oral QHS Aleda GranaZeigler, Dustin G, RPH   10 mg at 05/15/17 2148  . LORazepam (ATIVAN) tablet 1 mg  1 mg Oral Q8H PRN Joy, Shawn C, PA-C   1 mg at 05/15/17 1021  . triamterene-hydrochlorothiazide (MAXZIDE-25) 37.5-25 MG per tablet 1 tablet  1 tablet Oral Daily Joy, Shawn C, PA-C   1 tablet at 05/16/17 [id number]  . ziprasidone (GEODON) injection 20 mg  20 mg Intramuscular Q12H PRN Cherly Beachorman, Jacqueline J, DO   20 mg at 05/13/17 0700   Current Outpatient Medications  Medication Sig Dispense Refill  . amantadine (SYMMETREL) 100 MG capsule Take 1 capsule (100 mg total) by mouth 2 (two) times daily. 60 capsule 0  . benztropine (COGENTIN) 1 MG tablet Take 1 mg by mouth 2 (two) times daily.    . fluticasone (FLONASE) 50 MCG/ACT nasal spray Place 2 sprays into both nostrils daily. (Patient not taking: Reported on 05/09/2017) 126 g 2  . hydrocortisone 2.5 % lotion Apply topically 2 (two) times daily. (Patient not taking: Reported on 05/09/2017) 59 mL 0  . hydrocortisone cream 1 % Apply to affected area 2 times daily (Patient not taking: Reported on 05/09/2017) 15 g 0  . levocetirizine (XYZAL) 5 MG tablet Take 1 tablet (5 mg total) by mouth every evening. (Patient not taking: Reported on 05/09/2017) 30 tablet 0  . LORazepam (ATIVAN) 1 MG tablet Take 1 tablet (1 mg total) by mouth every 8 (eight) hours as needed for anxiety (agitation). (Patient not taking: Reported on 05/09/2017) 30 tablet 0  . Multiple Vitamin (MULTIVITAMIN WITH MINERALS) TABS tablet Take 1 tablet by mouth daily.    . ondansetron (ZOFRAN-ODT) 8 MG disintegrating tablet Take 1 tablet (8 mg total) by mouth every 8 (eight) hours as needed for nausea. (Patient not taking: Reported on 05/09/2017) 10 tablet 0  . Oxcarbazepine (TRILEPTAL) 300 MG tablet Take 1 tablet (300 mg total) by mouth 2 (two) times daily. (Patient  not taking: Reported on 05/09/2017) 60 tablet 0  . paliperidone (INVEGA SUSTENNA) 234 MG/1.5ML SUSP injection Inject 234 mg into the muscle every 28 (twenty-eight) days.    . paliperidone (INVEGA) 3 MG 24 hr tablet Take 1 tablet (3 mg total) by mouth daily. (Patient not taking: Reported on 05/09/2017) 30 tablet 0  . polyvinyl alcohol (LIQUIFILM TEARS) 1.4 % ophthalmic solution Place 1 drop into both eyes as needed for dry eyes.    Marland Kitchen. risperiDONE (RISPERDAL) 2 MG tablet Take 6 mg by mouth at bedtime.    . topiramate (TOPAMAX) 50 MG tablet Take 50 mg by mouth 3 (three) times daily.  4  . triamterene-hydrochlorothiazide (MAXZIDE-25) 37.5-25 MG tablet Take 1 tablet by mouth daily. (Patient not taking:  Reported on 05/09/2017) 30 tablet 11  . trimethoprim-polymyxin b (POLYTRIM) ophthalmic solution Place 2 drops into both eyes every 4 (four) hours. (Patient not taking: Reported on 05/09/2017) 10 mL 0    Musculoskeletal: Strength & Muscle Tone: within normal limits Gait & Station: normal Patient leans: N/A  Psychiatric Specialty Exam: Physical Exam  Constitutional: She is oriented to person, place, and time. She appears well-developed and well-nourished.  HENT:  Head: Normocephalic.  Musculoskeletal: Normal range of motion.  Neurological: She is alert and oriented to person, place, and time.  Psychiatric: She has a normal mood and affect. Her speech is normal and behavior is normal. Thought content is paranoid. Thought content is not delusional. Cognition and memory are impaired. She expresses impulsivity.    Review of Systems  Psychiatric/Behavioral: Positive for depression. Negative for hallucinations, memory loss, substance abuse and suicidal ideas. The patient is not nervous/anxious and does not have insomnia.   All other systems reviewed and are negative.   Blood pressure (!) 147/94, pulse (!) 107, temperature 97.9 F (36.6 C), temperature source Axillary, resp. rate 16, SpO2 100 %.There is no  height or weight on file to calculate BMI.  General Appearance: Casual and Fairly Groomed  Eye Contact:  Good  Speech:  Clear and Coherent and Normal Rate  Volume:  Normal  Mood:  Euthymic  Affect:  Congruent  Thought Process:  Coherent  Orientation:  Full (Time, Place, and Person)  Thought Content:  Logical  Suicidal Thoughts:  No  Homicidal Thoughts:  No  Memory:  Immediate;   Good Recent;   Good Remote;   Fair  Judgement:  Poor  Insight:  Lacking  Psychomotor Activity:  Normal  Concentration:  Concentration: Good and Attention Span: Good  Recall:  Good  Fund of Knowledge:  Good  Language:  Good  Akathisia:  No  Handed:  Right  AIMS (if indicated):     Assets:  ArchitectCommunication Skills Financial Resources/Insurance Housing  ADL's:  Intact  Cognition:  WNL  Sleep:        Treatment Plan Summary: Plan Paranoid type delusional disorder (HCC) Discharge Home Take all medications as prescribed Follow up with [facility] for medication management Avoid the use of alcohol and illicit drugs  Disposition: No evidence of imminent risk to self or others at present.   Patient does not meet criteria for psychiatric inpatient admission. Supportive therapy provided about ongoing stressors. Discussed crisis plan, support from social network, calling 911, coming to the Emergency Department, and calling Suicide Hotline.  Laveda AbbeLaurie Britton Parks, NP 05/16/2017 10:51 AM  Patient seen face-to-face for psychiatric evaluation, chart reviewed and case discussed with the physician extender and developed treatment plan. Reviewed the information documented and agree with the treatment plan. Thedore MinsMojeed Nadine Ryle, MD

## 2017-05-18 IMAGING — CT CT HEAD W/O CM
1 series · 16 of 30 positions shown, 20 images · non-contrast
Comparison: 09/17/2014

CLINICAL DATA: Altered mental status, chest pain, history of
cerebral aneurysm

EXAM:
CT HEAD WITHOUT CONTRAST
TECHNIQUE: Contiguous axial images were obtained from the base of the skull
through the vertex without intravenous contrast.

[Series 2: head 5.0 h30s · axial · 0.44mm/px · z∈[-128,+7]mm · 16 of 31 slices shown, 20 images]
[im 2/31  brain]
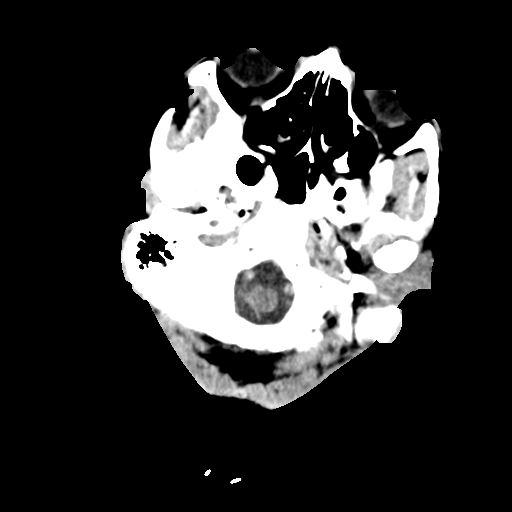
[im 2/31  bone]
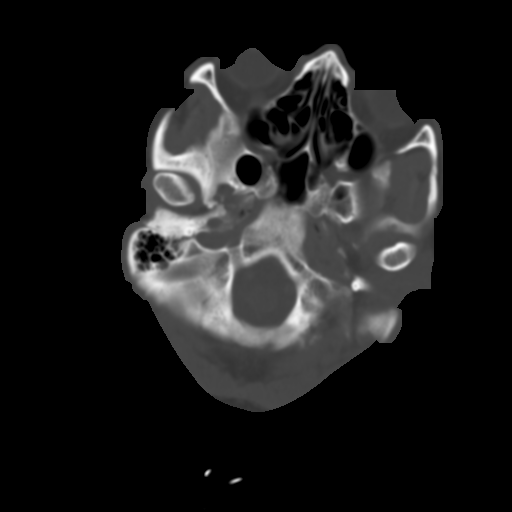
[im 4/31  brain]
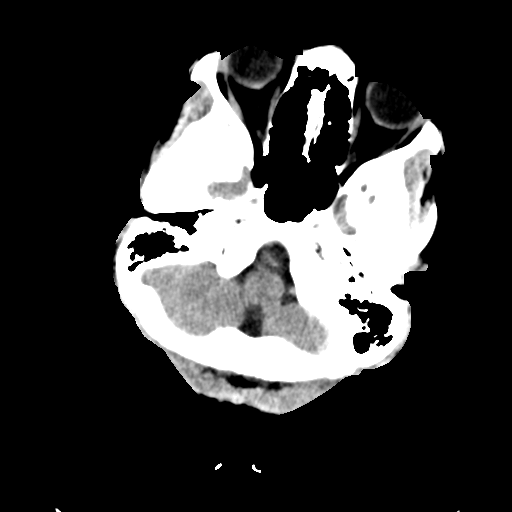
[im 6/31  brain]
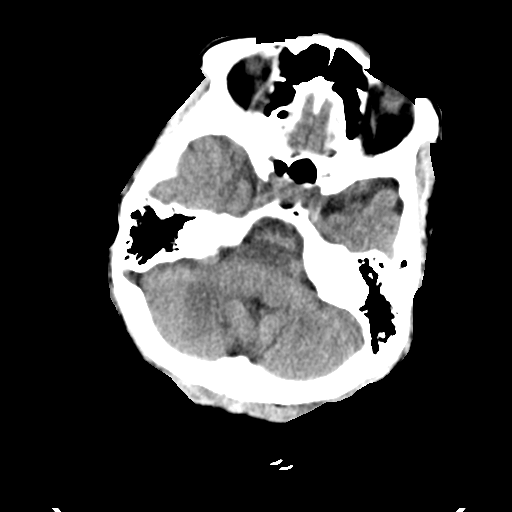
[im 8/31  brain]
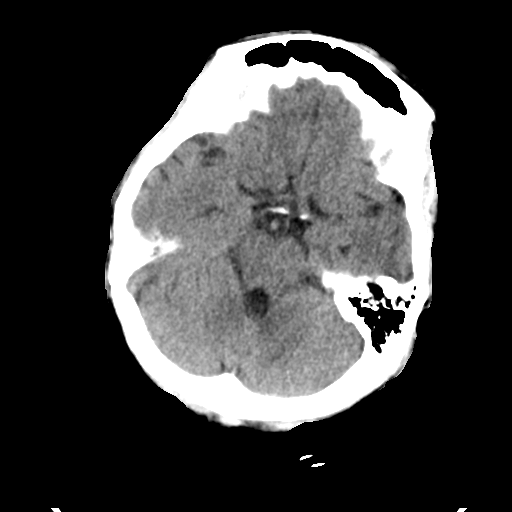
[im 9/31  brain]
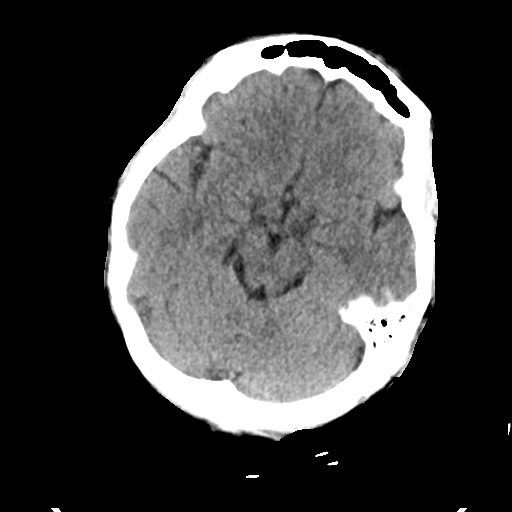
[im 9/31  bone]
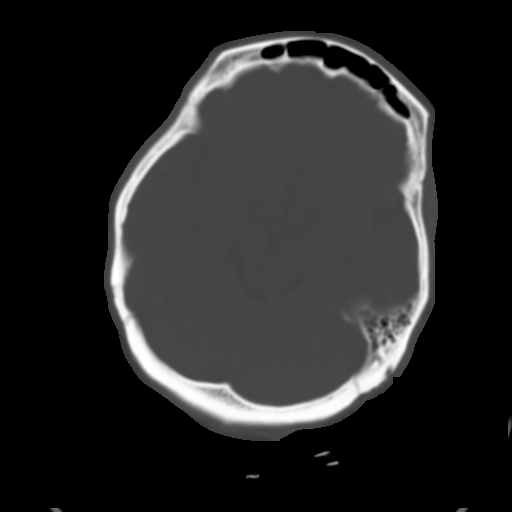
[im 11/31  brain]
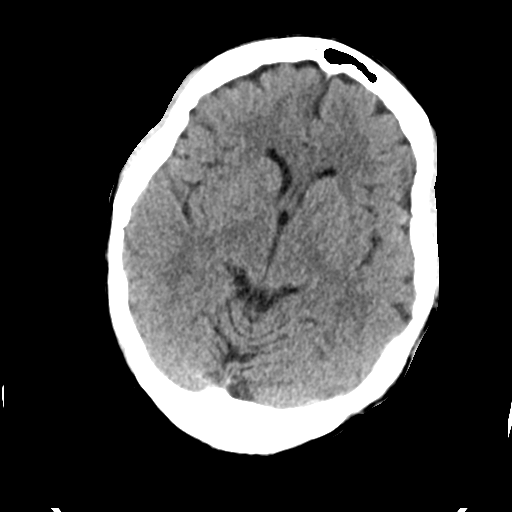
[im 13/31  brain]
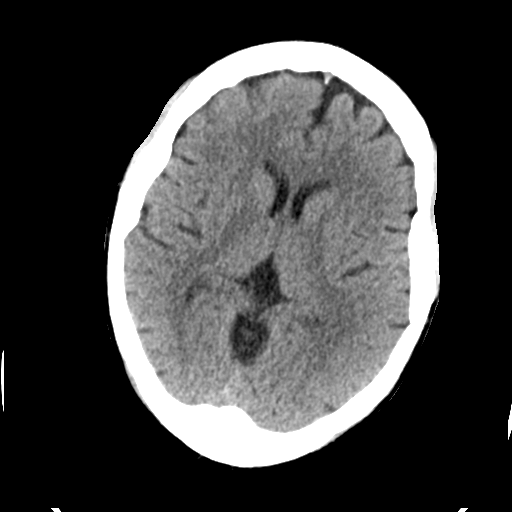
[im 15/31  brain]
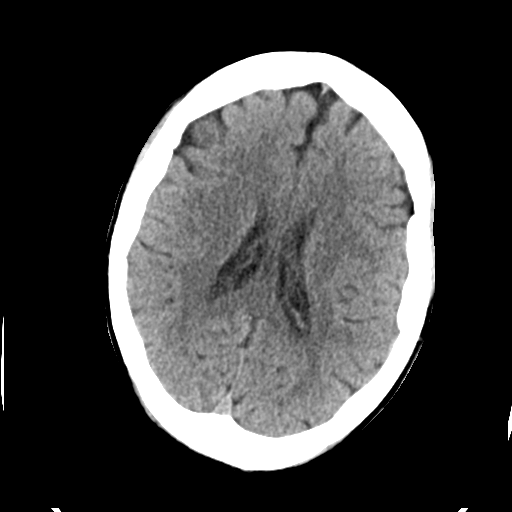
[im 16/31  brain]
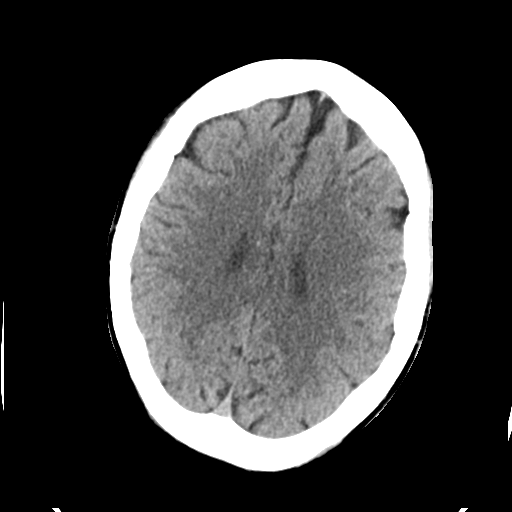
[im 16/31  bone]
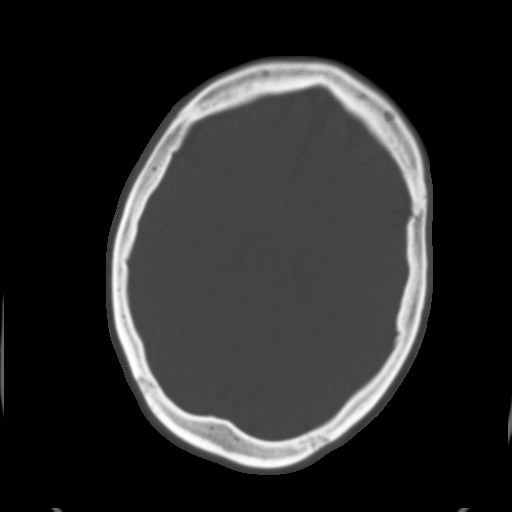
[im 18/31  brain]
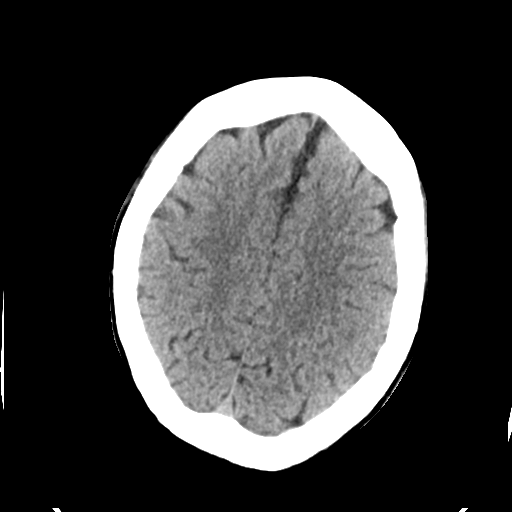
[im 20/31  brain]
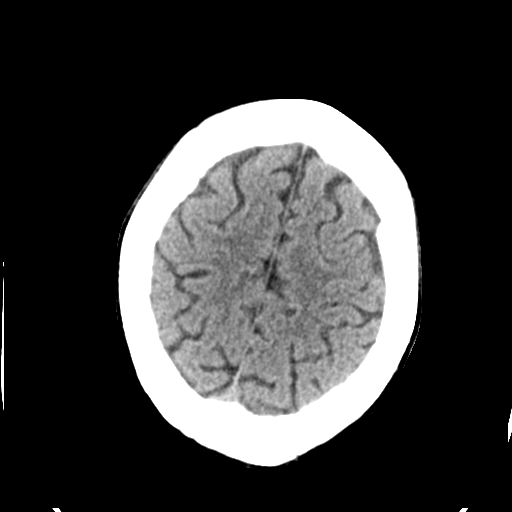
[im 22/31  brain]
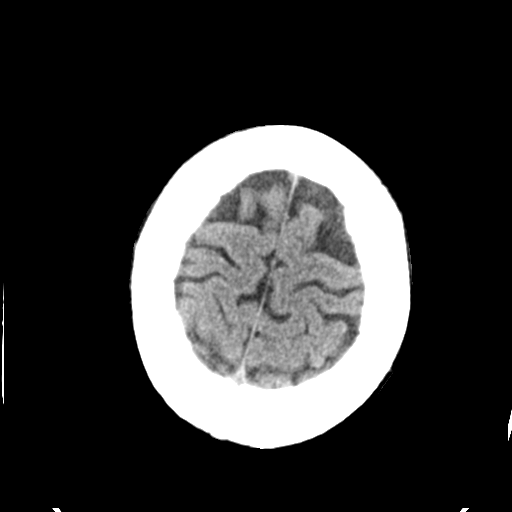
[im 23/31  brain]
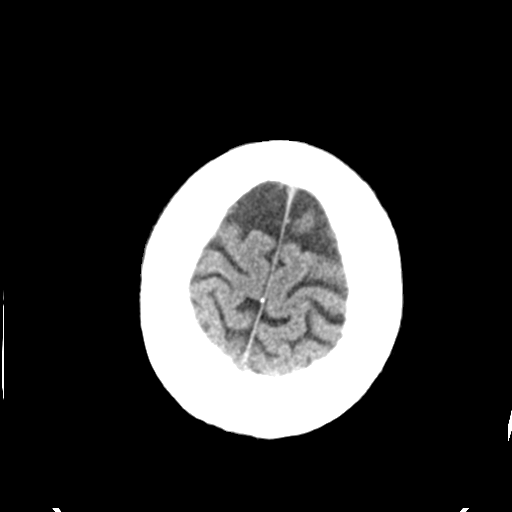
[im 23/31  bone]
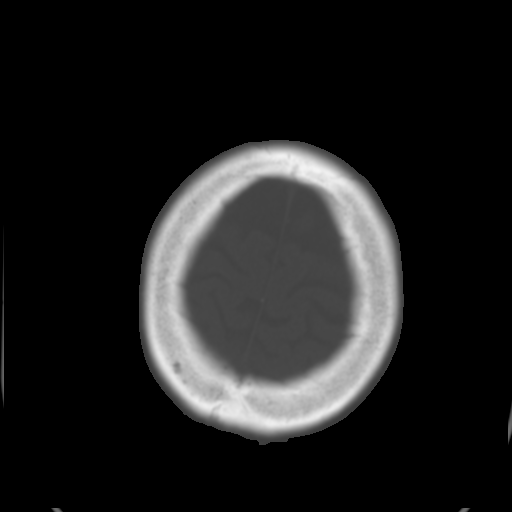
[im 25/31  brain]
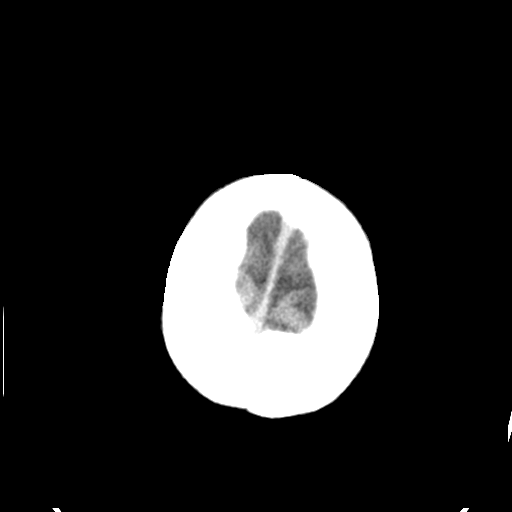
[im 27/31  brain]
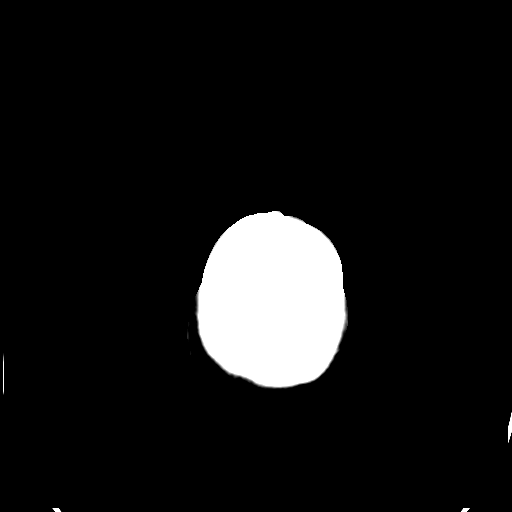
[im 29/31  brain]
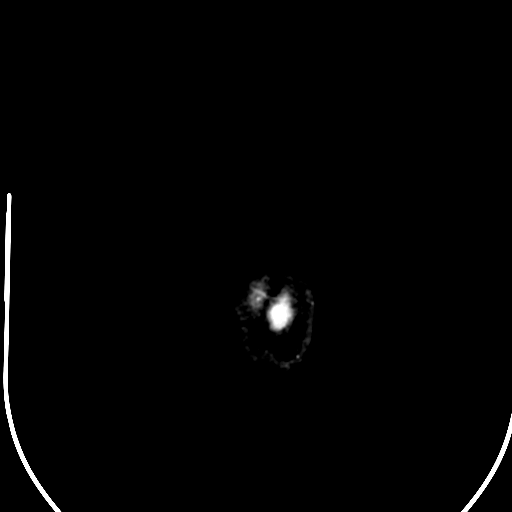

[16 of 30 positions shown; findings below may reference images not displayed]

FINDINGS: Normal ventricular morphology.

No midline shift or mass effect.

Normal appearance of brain parenchyma.

No intracranial hemorrhage, mass lesion or evidence acute
infarction.

No extra-axial fluid collections.

No evidence of aneurysm clip or prior intracranial surgery
identified.

Visualized paranasal sinuses and mastoid air cells clear.

Osseous structures unremarkable.
IMPRESSION: Normal exam.

## 2017-06-14 ENCOUNTER — Other Ambulatory Visit: Payer: Self-pay

## 2017-06-14 ENCOUNTER — Emergency Department (HOSPITAL_COMMUNITY)
Admission: EM | Admit: 2017-06-14 | Discharge: 2017-06-14 | Disposition: A | Discharge status: Home / Self Care | Payer: Medicare Other | Attending: Emergency Medicine | Admitting: Emergency Medicine

## 2017-06-14 ENCOUNTER — Encounter (HOSPITAL_COMMUNITY): Payer: Self-pay

## 2017-06-14 DIAGNOSIS — F70 Mild intellectual disabilities: Secondary | ICD-10-CM | POA: Insufficient documentation

## 2017-06-14 DIAGNOSIS — G43909 Migraine, unspecified, not intractable, without status migrainosus: Secondary | ICD-10-CM | POA: Insufficient documentation

## 2017-06-14 DIAGNOSIS — Z760 Encounter for issue of repeat prescription: Secondary | ICD-10-CM | POA: Diagnosis present

## 2017-06-14 MED ORDER — TOPIRAMATE 50 MG PO TABS: 50.0000 mg | ORAL_TABLET | Freq: Two times a day (BID) | ORAL | 0 refills | Status: DC

## 2017-06-14 NOTE — ED Provider Notes (Signed)
[facility]ENC[facility] Provider Note   CSN: [id number] Arrival date & time: 06/14/17  1346     History   Chief Complaint Chief Complaint  Patient presents with  . Medication Refill  . Medical Clearance    HPI Wendy Berry is a 31 y.o. female.  HPI   31 year old female presents today with numerous requests.  Patient reports that her license has been revoked and has paperwork requesting physician evaluation.  Patient also reports she would like a refill on her Topamax for her migraines.  She notes she was placed on this approximately 1 year ago and it has significantly helped her migraines, she notes that she ran out approximately 1 month ago and has not been able to see her primary care provider.  She denies any present headache, neurological deficits, or any physical complaints.  Past Medical History:  Diagnosis Date  . Cerebral aneurysm   . Heart murmur   . Mental retardation     Patient Active Problem List   Diagnosis Date Noted  . Hallucinations   . Involuntary commitment   . 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 BRAIN SURGERY      OB History    No data available       Home Medications    Prior to Admission medications   Medication Sig Start Date End Date Taking? Authorizing Provider  amantadine (SYMMETREL) 100 MG capsule Take 1 capsule (100 mg total) by mouth 2 (two) times daily. Patient not taking: Reported on 06/14/2017 05/02/16   Charm RingsLord, Jamison Y, NP  levocetirizine (XYZAL) 5 MG tablet Take 1 tablet (5 mg total) by mouth every evening. Patient not taking: Reported on 05/09/2017 05/24/16   Deatra Canterxford, William J, FNP  Oxcarbazepine (TRILEPTAL) 300 MG tablet Take 1 tablet (300 mg total) by mouth 2 (two) times daily. Patient not taking: Reported on 05/09/2017 05/02/16   Charm RingsLord, Jamison Y, NP  paliperidone (INVEGA SUSTENNA) 234 MG/1.5ML SUSP injection Inject 234  mg into the muscle every 28 (twenty-eight) days.    [provider]  paliperidone (INVEGA) 3 MG 24 hr tablet Take 1 tablet (3 mg total) by mouth daily. Patient not taking: Reported on 05/09/2017 05/03/16   Charm RingsLord, Jamison Y, NP  topiramate (TOPAMAX) 50 MG tablet Take 1 tablet (50 mg total) by mouth 2 (two) times daily. 06/14/17   Abby Tucholski, Tinnie GensJeffrey, PA-C  triamterene-hydrochlorothiazide (MAXZIDE-25) 37.5-25 MG tablet Take 1 tablet by mouth daily. Patient not taking: Reported on 05/09/2017 06/02/16   Elvina SidleLauenstein, Kurt, MD    Family History No family history on file.  Social History Social History   Tobacco Use  . Smoking status: Never Smoker  . Smokeless tobacco: Never Used  Substance Use Topics  . Alcohol use: No  . Drug use: No     Allergies   Patient has no known allergies.   Review of Systems Review of Systems  All other systems reviewed and are negative.    Physical Exam Updated Vital Signs BP (!) 158/111 (BP Location: Right Arm)   Pulse 97   Temp 98.4 F (36.9 C) (Oral)   Resp 20   Ht 5\' 5"  (1.651 m)   Wt 72.6 kg (160 lb)   LMP 05/31/2017   SpO2 100%   BMI 26.63 kg/m   Physical Exam  Constitutional: She is oriented to person, place, and time. She appears well-developed and well-nourished.  HENT:  Head: Normocephalic  and atraumatic.  Eyes: Conjunctivae are normal. Pupils are equal, round, and reactive to light. Right eye exhibits no discharge. Left eye exhibits no discharge. No scleral icterus.  Neck: Normal range of motion. No JVD present. No tracheal deviation present.  Pulmonary/Chest: Effort normal. No stridor.  Neurological: She is alert and oriented to person, place, and time. Coordination normal.  Psychiatric: She has a normal mood and affect. Her behavior is normal. Judgment and thought content normal.  Nursing note and vitals reviewed.    ED Treatments / Results  Labs (all labs ordered are listed, but only abnormal results are  displayed) Labs Reviewed - No data to display  EKG  EKG Interpretation None       Radiology No results found.  Procedures Procedures (including critical care time)  Medications Ordered in ED Medications - No data to display   Initial Impression / Assessment and Plan / ED Course  I have reviewed the triage vital signs and the nursing notes.  Pertinent labs & imaging results that were available during my care of the patient were reviewed by me and considered in my medical decision making (see chart for details).      Final Clinical Impressions(s) / ED Diagnoses   Final diagnoses:  Medication refill  Migraine without status migrainosus, not intractable, unspecified migraine type    Patient presents for medical clearance for DMV.  She has paperwork that is supposed to be filled out by a medical provider that is Artie filled out by her and has attached the hospital stickers to the paper.  She is requesting that we signed this information.  These papers indicate that her license was revoked.  I informed patient that by her filling out that information that she was forging health care providers exam and that she should not give that to the [facility].  I [facility] a [facility] stating that I did not fill out the paperwork and that patient filled this out on her own.  She will need to follow-up with primary care to fill out any paperwork related [facility].  She also requesting a refill of her Topamax.  She notes she has had good improvement in her migraine frequency while using this medication, she denies any migraines presently.  She denies any side effects from medication.  I find it reasonable to give her a short refill while she reestablishes care with her primary care.  She is given strict return precautions, she verbalized understanding and agreement to today's plan.  ED Discharge Orders        Ordered    topiramate (TOPAMAX) 50 MG tablet  2 times daily     06/14/17 1736        Rosalio LoudHedges, Hanako Tipping, PA-C 06/14/17 2054    Raeford RazorKohut, [name], MD 06/20/17 1109

## 2017-06-14 NOTE — ED Triage Notes (Signed)
Pt states she takes topamax for headaches and needs refill. Also would like medication for nasal congestion. Pt would also like to have paper filled out for clearance for DMV.

## 2017-06-14 NOTE — ED Notes (Signed)
Patient complaining of stuffy nose and headache.  States she is having a migraine and out of her Topamax.   Denies light sensitivity, nausea, or vomiting. No apparent distress.  Requesting refill.

## 2017-06-14 NOTE — Discharge Instructions (Signed)
Please read attached information. If you experience any new or worsening signs or symptoms please return to the emergency room for evaluation. Please follow-up with your primary care provider or specialist as discussed. Please use medication prescribed only as directed and discontinue taking if you have any concerning signs or symptoms.   °

## 2017-10-23 IMAGING — CR DG FOOT COMPLETE 3+V*L*
3 series · 3 of 3 positions shown · non-contrast
Comparison: None.

CLINICAL DATA: 29-year-old female with trauma to the left foot and
chronic wound on the left halo

EXAM:
LEFT FOOT - COMPLETE 3+ VIEW

[foot ap]
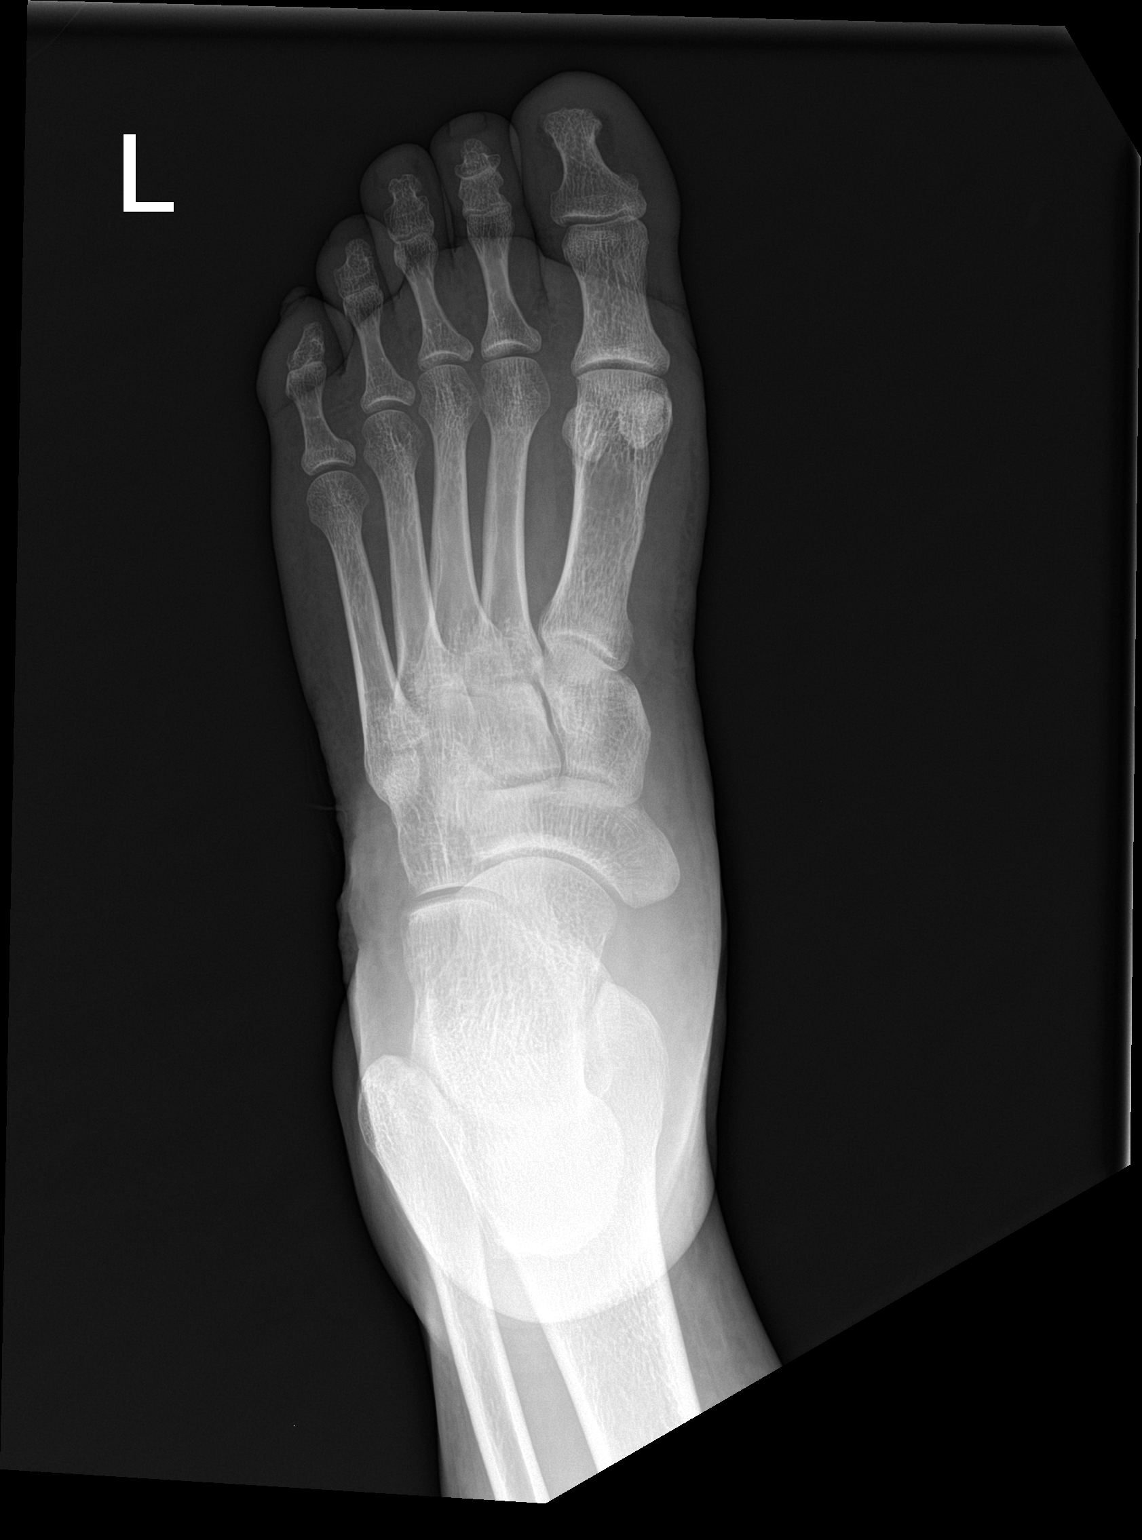

[foot obl]
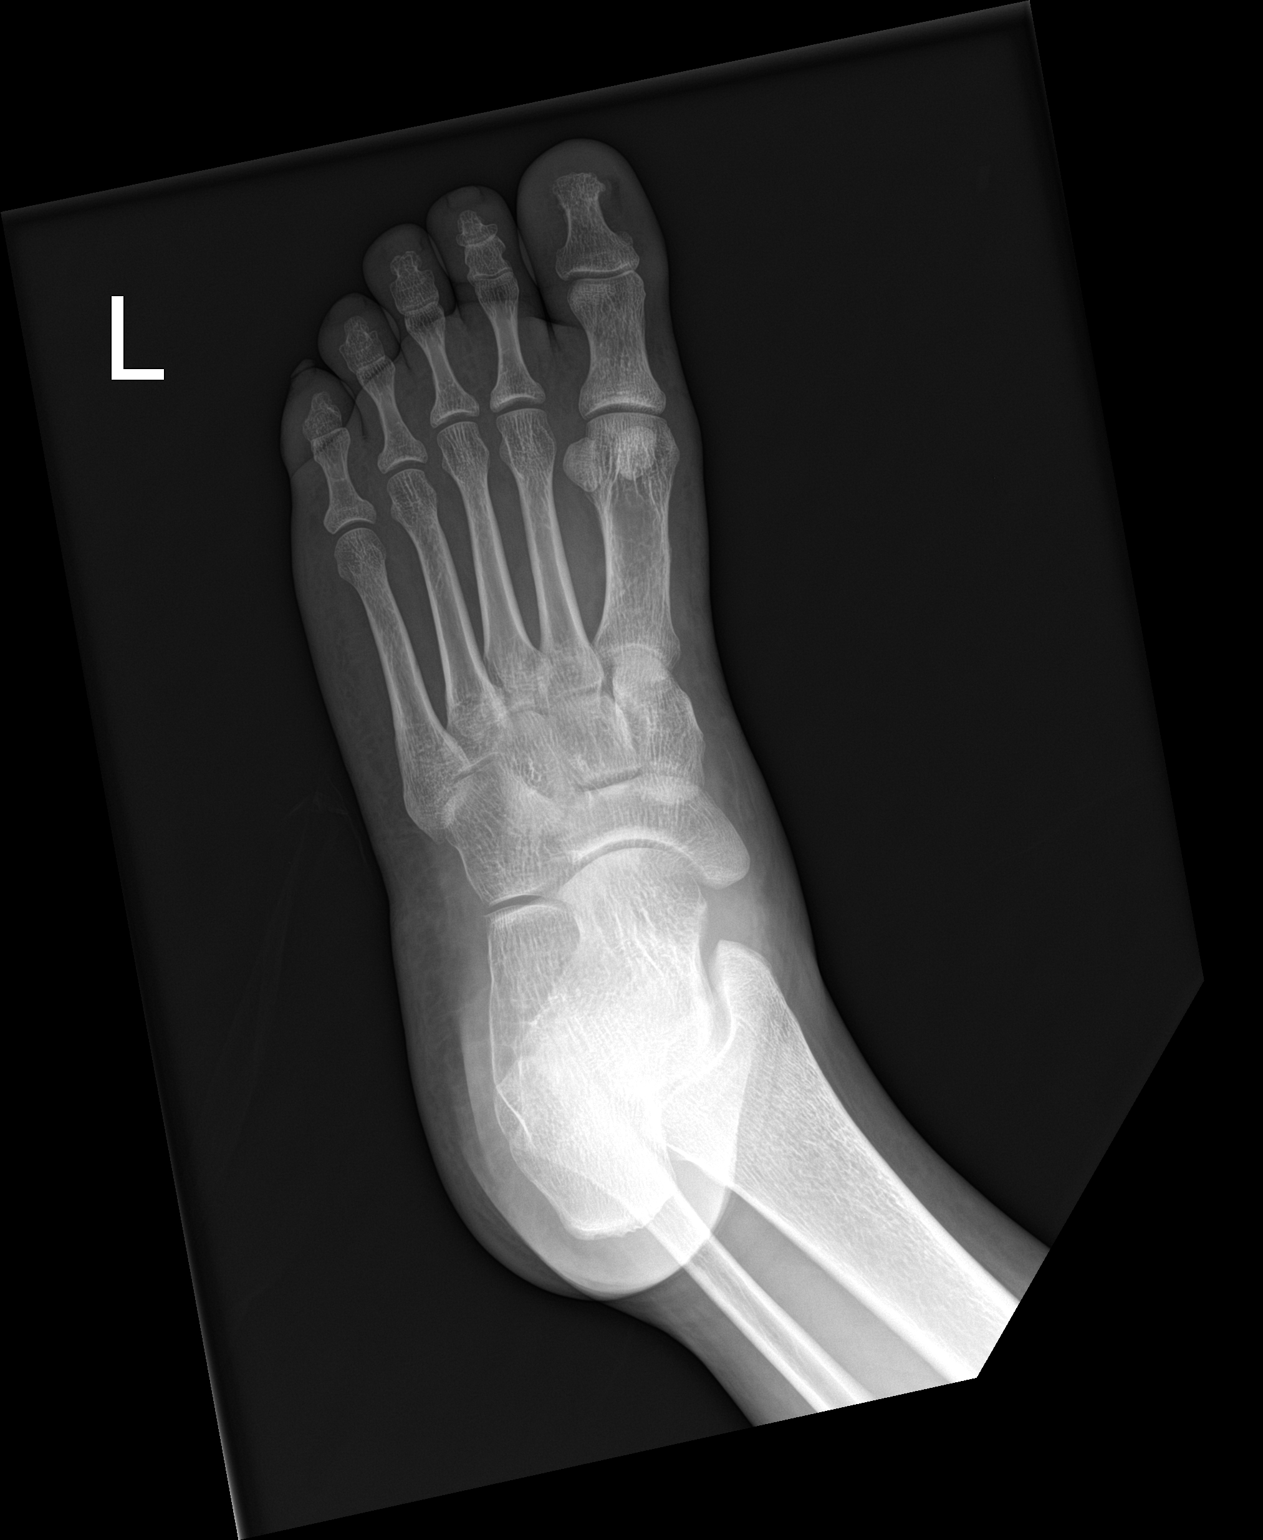

[foot lat]
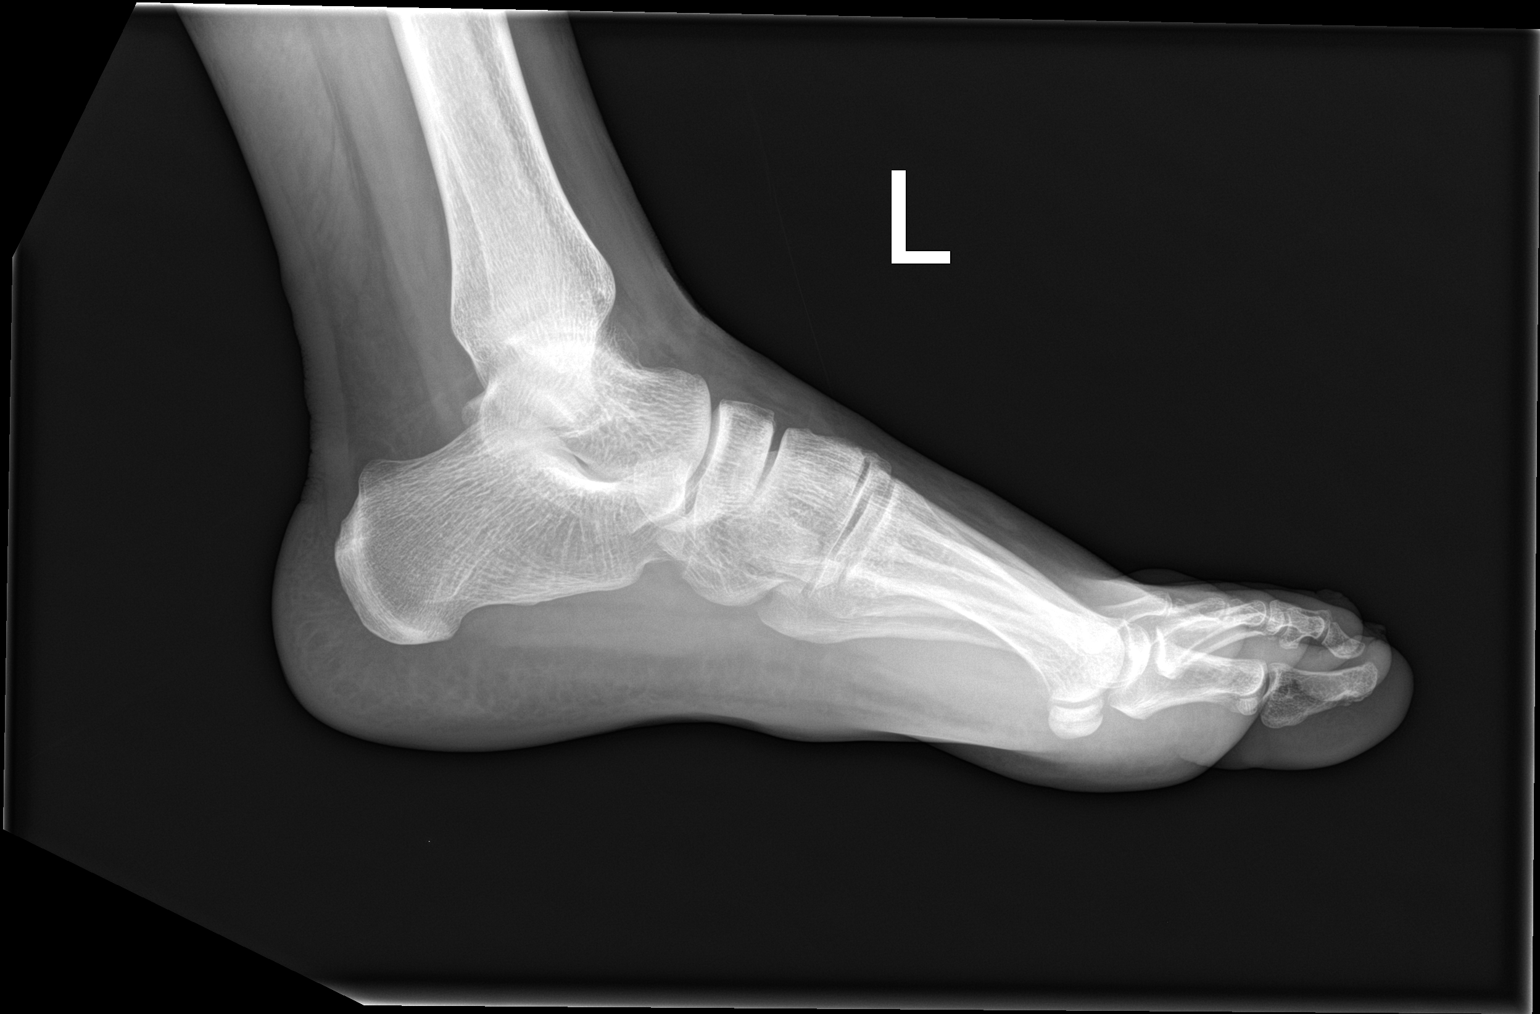

[3 of 3 positions shown; findings below may reference images not displayed]

FINDINGS: There is no evidence of fracture or dislocation. There is no
evidence of arthropathy or other focal bone abnormality. Soft
tissues are unremarkable.
IMPRESSION: Negative.

## 2017-11-18 ENCOUNTER — Encounter (HOSPITAL_COMMUNITY): Payer: Self-pay | Admitting: Emergency Medicine

## 2017-11-18 ENCOUNTER — Other Ambulatory Visit: Payer: Self-pay

## 2017-11-18 ENCOUNTER — Emergency Department (HOSPITAL_COMMUNITY)
Admission: EM | Admit: 2017-11-18 | Discharge: 2017-11-18 | Disposition: A | Discharge status: Home / Self Care | Payer: Medicare Other | Attending: Emergency Medicine | Admitting: Emergency Medicine

## 2017-11-18 DIAGNOSIS — R51 Headache: Secondary | ICD-10-CM | POA: Diagnosis not present

## 2017-11-18 DIAGNOSIS — Z79899 Other long term (current) drug therapy: Secondary | ICD-10-CM | POA: Insufficient documentation

## 2017-11-18 DIAGNOSIS — Z8679 Personal history of other diseases of the circulatory system: Secondary | ICD-10-CM | POA: Insufficient documentation

## 2017-11-18 DIAGNOSIS — F200 Paranoid schizophrenia: Secondary | ICD-10-CM | POA: Diagnosis not present

## 2017-11-18 DIAGNOSIS — F419 Anxiety disorder, unspecified: Secondary | ICD-10-CM | POA: Diagnosis not present

## 2017-11-18 DIAGNOSIS — F819 Developmental disorder of scholastic skills, unspecified: Secondary | ICD-10-CM | POA: Diagnosis not present

## 2017-11-18 DIAGNOSIS — G8929 Other chronic pain: Secondary | ICD-10-CM

## 2017-11-18 HISTORY — DX: Paranoid schizophrenia: F20.0

## 2017-11-18 MED ORDER — ACETAMINOPHEN 325 MG PO TABS
650.0000 mg | ORAL_TABLET | Freq: Once | ORAL | Status: AC
  Administered 2017-11-18: 650 mg via ORAL
  Filled 2017-11-18: qty 2

## 2017-11-18 MED ORDER — MELOXICAM 7.5 MG PO TABS: 7.5000 mg | ORAL_TABLET | Freq: Every day | ORAL | 0 refills | Status: DC

## 2017-11-18 MED ORDER — HYDROXYZINE HCL 25 MG PO TABS: 25.0000 mg | ORAL_TABLET | Freq: Four times a day (QID) | ORAL | 0 refills | Status: DC | PRN

## 2017-11-18 NOTE — Discharge Instructions (Signed)

## 2017-11-18 NOTE — ED Provider Notes (Signed)
[facility] EMERGENCY DEPARTMENT Provider Note   CSN: [id number] Arrival date & time: 11/18/17  0119     History   Chief Complaint Chief Complaint  Patient presents with  . Headache  . Anxiety    HPI Wendy Berry is a 32 y.o. female  has a past medical history of Cerebral aneurysm, Heart murmur, Mental retardation, and Paranoid schizophrenia (HCC).  She presents the emergency department chief complaint of chronic headache and anxiety.  The patient is disorganized in thought, mumbling to herself.  She is a very poor historian.  The patient expresses chronic daily headaches that she describes as sharp.  She has not taken anything for headaches and several weeks.  She used to be on Topamax however she states that this did not help her.  Patient states that her way she also complains of anxiety daily.  Under her breath the patient mumbles about shutting this hospital down.  She seems to be responding to internal stimuli and voices.  She does not take anything for her mental illness.  The patient however is able to respond appropriately to questions and produce organized thought when redirected.   HPI  Past Medical History:  Diagnosis Date  . Cerebral aneurysm   . Heart murmur   . Mental retardation   . Paranoid schizophrenia [facility])     Patient Active Problem List   Diagnosis Date Noted  . Hallucinations   . Involuntary commitment   . 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 BRAIN SURGERY       OB History   None      Home Medications    Prior to Admission medications   Medication Sig Start Date End Date Taking? Authorizing Provider  amantadine (SYMMETREL) 100 MG capsule Take 1 capsule (100 mg total) by mouth 2 (two) times daily. Patient not taking: Reported on 06/14/2017 05/02/16   Charm Rings, NP  hydrOXYzine (ATARAX/VISTARIL) 25 MG tablet Take 1 tablet (25 mg total) by mouth  every 6 (six) hours as needed for anxiety. 11/18/17   Melesio Madara, Cammy Copa, PA-C  levocetirizine (XYZAL) 5 MG tablet Take 1 tablet (5 mg total) by mouth every evening. Patient not taking: Reported on 05/09/2017 05/24/16   Deatra Canter, FNP  meloxicam (MOBIC) 7.5 MG tablet Take 1 tablet (7.5 mg total) by mouth daily. AS NEEDED FOR HEADACHE. 11/18/17   Arthor Captain, PA-C  Oxcarbazepine (TRILEPTAL) 300 MG tablet Take 1 tablet (300 mg total) by mouth 2 (two) times daily. Patient not taking: Reported on 05/09/2017 05/02/16   Charm Rings, NP  paliperidone (INVEGA SUSTENNA) 234 MG/1.5ML SUSP injection Inject 234 mg into the muscle every 28 (twenty-eight) days.    [provider]  paliperidone (INVEGA) 3 MG 24 hr tablet Take 1 tablet (3 mg total) by mouth daily. Patient not taking: Reported on 05/09/2017 05/03/16   Charm Rings, NP  topiramate (TOPAMAX) 50 MG tablet Take 1 tablet (50 mg total) by mouth 2 (two) times daily. 06/14/17   Hedges, Tinnie Gens, PA-C  triamterene-hydrochlorothiazide (MAXZIDE-25) 37.5-25 MG tablet Take 1 tablet by mouth daily. Patient not taking: Reported on 05/09/2017 06/02/16   Elvina Sidle, MD    Family History No family history on file.  Social History Social History   Tobacco Use  . Smoking status: Never Smoker  . Smokeless tobacco: Never Used  Substance Use Topics  . Alcohol use: No  . Drug  use: No     Allergies   Patient has no known allergies.   Review of Systems Review of Systems  Ten systems reviewed and are negative for acute change, except as noted in the HPI.   Physical Exam Updated Vital Signs BP (!) 150/117 (BP Location: Right Arm)   Pulse 92   Temp 97.8 F (36.6 C) (Oral)   Resp 16   Ht  (1.626 m)   Wt 72.6 kg (160 lb)   SpO2 100%   BMI 27.46 kg/m   Physical Exam  Constitutional: She is oriented to person, place, and time. She appears well-developed and well-nourished. No distress.  HENT:  Head: Normocephalic and  atraumatic.  Mouth/Throat: Oropharynx is clear and moist.  Eyes: Pupils are equal, round, and reactive to light. Conjunctivae and EOM are normal. No scleral icterus.  No horizontal, vertical or rotational nystagmus  Neck: Normal range of motion. Neck supple.  Full active and passive ROM without pain No midline or paraspinal tenderness No nuchal rigidity or meningeal signs  Cardiovascular: Normal rate, regular rhythm, normal heart sounds and intact distal pulses. Exam reveals no gallop and no friction rub.  No murmur heard. Pulmonary/Chest: Effort normal and breath sounds normal. No respiratory distress. She has no wheezes. She has no rales.  Abdominal: Soft. Bowel sounds are normal. She exhibits no distension and no mass. There is no tenderness. There is no rebound and no guarding.  Musculoskeletal: Normal range of motion.  Lymphadenopathy:    She has no cervical adenopathy.  Neurological: She is alert and oriented to person, place, and time. No cranial nerve deficit. She exhibits normal muscle tone. Coordination normal.  Mental Status:  Alert, oriented, thought content appropriate. Speech fluent without evidence of aphasia. Able to follow 2 step commands without difficulty.  Cranial Nerves:  II:  Peripheral visual fields grossly normal, pupils equal, round, reactive to light III,IV, VI: ptosis not present, extra-ocular motions intact bilaterally  V,VII: smile symmetric, facial light touch sensation equal VIII: hearing grossly normal bilaterally  IX,X: midline uvula rise  XI: bilateral shoulder shrug equal and strong XII: midline tongue extension  Motor:  5/5 in upper and lower extremities bilaterally including strong and equal grip strength and dorsiflexion/plantar flexion Sensory: Pinprick and light touch normal in all extremities.  Cerebellar: normal finger-to-nose with bilateral upper extremities Gait: normal gait and balance CV: distal pulses palpable throughout   Skin: Skin is  warm and dry. No rash noted. She is not diaphoretic.  Psychiatric: She has a normal mood and affect. Judgment normal. She is slowed, withdrawn and actively hallucinating. Thought content is paranoid and delusional. She is inattentive.  Nursing note and vitals reviewed.    ED Treatments / Results  Labs (all labs ordered are listed, but only abnormal results are displayed) Labs Reviewed - No data to display  EKG None  Radiology No results found.  Procedures Procedures (including critical care time)  Medications Ordered in ED Medications  acetaminophen (TYLENOL) tablet 650 mg (650 mg Oral Given 11/18/17 0932)     Initial Impression / Assessment and Plan / ED Course  I have reviewed the triage vital signs and the nursing notes.  Pertinent labs & imaging results that were available during my care of the patient were reviewed by me and considered in my medical decision making (see chart for details).     Patient with chronic headaches.  She has some very obvious mental health issues however she is calm and collected, responds  appropriately.  She is not complaining of any suicidal, homicidal ideations and has overall poor insight into her disease.  Patient be discharged with hydroxyzine and Mobic.  I have advised her to follow closely at [facility] where she can be treated appropriately.  She also appears to have chronic hypertension and is currently untreated.  She would need to follow-up with her primary care physician as soon as possible.  I discussed this with her the patient appears appropriate for discharge at this time.  Final Clinical Impressions(s) / ED Diagnoses   Final diagnoses:  Chronic nonintractable headache, unspecified headache type  Anxiety    ED Discharge Orders        Ordered    hydrOXYzine (ATARAX/VISTARIL) 25 MG tablet  Every 6 hours PRN     11/18/17 0942    meloxicam (MOBIC) 7.5 MG tablet  Daily     11/18/17 0942       Arthor Captain,  PA-C 11/18/17 [id number]    Lorre Nick, MD 11/19/17 [phone]

## 2017-11-18 NOTE — ED Notes (Signed)
Called for vitals no response

## 2017-11-18 NOTE — ED Triage Notes (Signed)
Reports having a headache for over a year and also c/o anxiety.  Rambling in triage about random subjects.

## 2017-11-20 ENCOUNTER — Emergency Department (HOSPITAL_BASED_OUTPATIENT_CLINIC_OR_DEPARTMENT_OTHER)
Admission: EM | Admit: 2017-11-20 | Discharge: 2017-11-20 | Disposition: A | Discharge status: Home / Self Care | Payer: Medicare Other | Attending: Physician Assistant | Admitting: Physician Assistant

## 2017-11-20 ENCOUNTER — Encounter (HOSPITAL_BASED_OUTPATIENT_CLINIC_OR_DEPARTMENT_OTHER): Payer: Self-pay | Admitting: Emergency Medicine

## 2017-11-20 ENCOUNTER — Other Ambulatory Visit: Payer: Self-pay

## 2017-11-20 DIAGNOSIS — R51 Headache: Secondary | ICD-10-CM | POA: Diagnosis present

## 2017-11-20 DIAGNOSIS — R519 Headache, unspecified: Secondary | ICD-10-CM

## 2017-11-20 DIAGNOSIS — F419 Anxiety disorder, unspecified: Secondary | ICD-10-CM | POA: Diagnosis not present

## 2017-11-20 DIAGNOSIS — Z79899 Other long term (current) drug therapy: Secondary | ICD-10-CM | POA: Diagnosis not present

## 2017-11-20 NOTE — ED Provider Notes (Signed)
[facility] EMERGENCY DEPARTMENT Provider Note   CSN: [id number] Arrival date & time: 11/20/17  1404     History   Chief Complaint Chief Complaint  Patient presents with  . Headache    HPI Wendy Berry is a 32 y.o. female.  HPI   32 year old female with history of paranoid schizophrenia, mental retardation, cerebral aneurysm and heart murmur presenting for evaluation of headache.  Patient brought here via EMS.  Patient has been arrested 2 days in a row for trespassing on her mother's house as well as her mother's best friend's house.  She report to EMS that she is been dealing with headache for at least 2 weeks.  She was noted to be noncooperative with the staff.  She was also seen in the ER yesterday for complaints of chronic headache and anxiety.  She has a thorough examination and was subsequently discharged home with Vistaril and Mobic.  Patient have not filled her medication yet.  She continues to endorse chronic daily headache.  Described headache as a throbbing forehead sensation lasting throughout the day.  She denies any light or sound sensitivity, no complaints of focal numbness or weakness or increased confusion.  The remainder of the examination is limited as patient is responding to internal stimuli.  Past Medical History:  Diagnosis Date  . Cerebral aneurysm   . Heart murmur   . Mental retardation   . Paranoid schizophrenia [facility])     Patient Active Problem List   Diagnosis Date Noted  . Hallucinations   . Involuntary commitment   . 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 BRAIN SURGERY       OB History   None      Home Medications    Prior to Admission medications   Medication Sig Start Date End Date Taking? Authorizing Provider  amantadine (SYMMETREL) 100 MG capsule Take 1 capsule (100 mg total) by mouth 2 (two) times daily. Patient not taking: Reported on 06/14/2017  05/02/16   Charm Rings, NP  hydrOXYzine (ATARAX/VISTARIL) 25 MG tablet Take 1 tablet (25 mg total) by mouth every 6 (six) hours as needed for anxiety. 11/18/17   Harris, Cammy Copa, PA-C  levocetirizine (XYZAL) 5 MG tablet Take 1 tablet (5 mg total) by mouth every evening. Patient not taking: Reported on 05/09/2017 05/24/16   Deatra Canter, FNP  meloxicam (MOBIC) 7.5 MG tablet Take 1 tablet (7.5 mg total) by mouth daily. AS NEEDED FOR HEADACHE. 11/18/17   Arthor Captain, PA-C  Oxcarbazepine (TRILEPTAL) 300 MG tablet Take 1 tablet (300 mg total) by mouth 2 (two) times daily. Patient not taking: Reported on 05/09/2017 05/02/16   Charm Rings, NP  paliperidone (INVEGA SUSTENNA) 234 MG/1.5ML SUSP injection Inject 234 mg into the muscle every 28 (twenty-eight) days.    [provider]  paliperidone (INVEGA) 3 MG 24 hr tablet Take 1 tablet (3 mg total) by mouth daily. Patient not taking: Reported on 05/09/2017 05/03/16   Charm Rings, NP  topiramate (TOPAMAX) 50 MG tablet Take 1 tablet (50 mg total) by mouth 2 (two) times daily. 06/14/17   Hedges, Tinnie Gens, PA-C  triamterene-hydrochlorothiazide (MAXZIDE-25) 37.5-25 MG tablet Take 1 tablet by mouth daily. Patient not taking: Reported on 05/09/2017 06/02/16   Elvina Sidle, MD    Family History History reviewed. No pertinent family history.  Social History Social History   Tobacco Use  . Smoking status: Never Smoker  .  Smokeless tobacco: Never Used  Substance Use Topics  . Alcohol use: No  . Drug use: No     Allergies   Patient has no known allergies.   Review of Systems Review of Systems  Unable to perform ROS: Psychiatric disorder     Physical Exam Updated Vital Signs BP (!) 142/97 (BP Location: Right Arm)   Pulse 88   Temp 98 F (36.7 C) (Oral)   Resp 18   Ht  (1.626 m)   Wt 72.6 kg (160 lb)   SpO2 100%   BMI 27.46 kg/m   Physical Exam  Constitutional: She appears well-developed and  well-nourished. No distress.  Patient is well-appearing, laying in bed talking to herself but in no acute discomfort.  Nontoxic  HENT:  Head: Normocephalic and atraumatic.  Eyes: Pupils are equal, round, and reactive to light. Conjunctivae and EOM are normal.  Neck: Normal range of motion. Neck supple.  No nuchal rigidity  Cardiovascular: Normal rate and regular rhythm.  Pulmonary/Chest: Effort normal and breath sounds normal.  Abdominal: Soft. There is no tenderness.  Musculoskeletal: Normal range of motion.  Neurological: She is alert.  Alert to self and situation.  Moving all 4 extremities without difficulty.  No focal neuro deficit.  Skin: Skin is warm. No rash noted.  Psychiatric: Her affect is inappropriate. Her speech is tangential. Thought content is paranoid. Cognition and memory are impaired.  Nursing note and vitals reviewed.    ED Treatments / Results  Labs (all labs ordered are listed, but only abnormal results are displayed) Labs Reviewed - No data to display  EKG None  Radiology No results found.  Procedures Procedures (including critical care time)  Medications Ordered in ED Medications - No data to display   Initial Impression / Assessment and Plan / ED Course  I have reviewed the triage vital signs and the nursing notes.  Pertinent labs & imaging results that were available during my care of the patient were reviewed by me and considered in my medical decision making (see chart for details).     BP (!) 142/97 (BP Location: Right Arm)   Pulse 88   Temp 98 F (36.7 C) (Oral)   Resp 18   Ht  (1.626 m)   Wt 72.6 kg (160 lb)   SpO2 100%   BMI 27.46 kg/m    Final Clinical Impressions(s) / ED Diagnoses   Final diagnoses:  Recurrent headache    ED Discharge Orders    None     3:07 PM Patient with psychiatric history including paranoid schizophrenia and mental retardation here with chronic headache.  Was seen and evaluated in the ED for  same yesterday.  She was brought here because she is trespassing her mother's house.  She does not have a place to stay.  Her headache is chronic in nature.  No red flags.  No fever or nuchal rigidity concerning for meningitis, no focal neuro deficit concerning for space-occupying lesion or stroke.  No sudden onset of frontal headache concerning for subarachnoid hemorrhage.  Encourage patient to fill her medication previous prescribed for headache.  Otherwise she will need to follow-up with her psychiatrist with her primary care provider for further management of her condition.   Fayrene Helper, PA-C 11/20/17 1510    Mackuen, Cindee Salt, MD 11/21/17 [phone]

## 2017-11-20 NOTE — Discharge Instructions (Addendum)
Please filled your medication previously prescribed for headache.  Follow-up with your doctor for further care.

## 2017-11-20 NOTE — ED Triage Notes (Signed)
Patient was picked up by EMS because [employer] called ems - EMS states that the patient has been arrested x 2 days in a row for trespassing on her mothers house, and then mothers best friends house. The patient is a frequent flyer and wants a place to stay until her friend gets home and can go back. EMS states that the patient then told them that she has had Headaches x 2 weeks and needed to come to the hospital when she was told by [employer] that she needed to leave the house for tresspassing. THe patient walked in with EMS in no noted distress and gets upset easily when she is not called " Wendy Berry"

## 2018-06-21 ENCOUNTER — Ambulatory Visit: Payer: Medicare Other | Admitting: Family Medicine

## 2018-06-30 ENCOUNTER — Emergency Department (HOSPITAL_COMMUNITY)
Admission: EM | Admit: 2018-06-30 | Discharge: 2018-07-01 | Disposition: A | Discharge status: Home / Self Care | Payer: Medicare Other | Attending: Emergency Medicine | Admitting: Emergency Medicine

## 2018-06-30 ENCOUNTER — Other Ambulatory Visit: Payer: Self-pay

## 2018-06-30 ENCOUNTER — Emergency Department (HOSPITAL_COMMUNITY): Payer: Medicare Other

## 2018-06-30 DIAGNOSIS — R51 Headache: Secondary | ICD-10-CM | POA: Diagnosis not present

## 2018-06-30 DIAGNOSIS — Z79899 Other long term (current) drug therapy: Secondary | ICD-10-CM | POA: Diagnosis not present

## 2018-06-30 DIAGNOSIS — R0789 Other chest pain: Secondary | ICD-10-CM

## 2018-06-30 DIAGNOSIS — R519 Headache, unspecified: Secondary | ICD-10-CM

## 2018-06-30 DIAGNOSIS — R079 Chest pain, unspecified: Secondary | ICD-10-CM | POA: Diagnosis present

## 2018-06-30 LAB — CBC
HEMATOCRIT: 39.4 % (ref 36.0–46.0)
Hemoglobin: 12.6 g/dL (ref 12.0–15.0)
MCH: 30.3 pg (ref 26.0–34.0)
MCHC: 32 g/dL (ref 30.0–36.0)
MCV: 94.7 fL (ref 80.0–100.0)
NRBC: 0 % (ref 0.0–0.2)
Platelets: 385 10*3/uL (ref 150–400)
RBC: 4.16 MIL/uL (ref 3.87–5.11)
RDW: 12.7 % (ref 11.5–15.5)
WBC: 7.9 10*3/uL (ref 4.0–10.5)

## 2018-06-30 LAB — BASIC METABOLIC PANEL
ANION GAP: 8 (ref 5–15)
BUN: 15 mg/dL (ref 6–20)
CHLORIDE: 107 mmol/L (ref 98–111)
CO2: 25 mmol/L (ref 22–32)
Calcium: 9.2 mg/dL (ref 8.9–10.3)
Creatinine, Ser: 1.06 mg/dL — ABNORMAL HIGH (ref 0.44–1.00)
GFR calc non Af Amer: >60 mL/min (ref >60)
GLUCOSE: 71 mg/dL (ref 70–99)
Potassium: 3.4 mmol/L — ABNORMAL LOW (ref 3.5–5.1)
Sodium: 140 mmol/L (ref 135–145)

## 2018-06-30 LAB — I-STAT TROPONIN, ED: Troponin i, poc: 0.05 ng/mL (ref 0.00–0.08)

## 2018-06-30 LAB — I-STAT BETA HCG BLOOD, ED (MC, WL, AP ONLY)

## 2018-06-30 NOTE — ED Triage Notes (Signed)
Patient c/o CP and HA that started "about a while" ago.

## 2018-07-01 DIAGNOSIS — R0789 Other chest pain: Secondary | ICD-10-CM | POA: Diagnosis not present

## 2018-07-01 LAB — I-STAT TROPONIN, ED: Troponin i, poc: 0.05 ng/mL (ref 0.00–0.08)

## 2018-07-01 MED ORDER — ALUM & MAG HYDROXIDE-SIMETH 200-200-20 MG/5ML PO SUSP
30.0000 mL | Freq: Once | ORAL | Status: DC
  Filled 2018-07-01: qty 30

## 2018-07-01 MED ORDER — LIDOCAINE VISCOUS HCL 2 % MT SOLN
15.0000 mL | Freq: Once | OROMUCOSAL | Status: DC
  Filled 2018-07-01: qty 15

## 2018-07-01 MED ORDER — POTASSIUM CHLORIDE CRYS ER 20 MEQ PO TBCR
40.0000 meq | EXTENDED_RELEASE_TABLET | Freq: Once | ORAL | Status: AC
  Administered 2018-07-01: 40 meq via ORAL
  Filled 2018-07-01: qty 2

## 2018-07-01 MED ORDER — ACETAMINOPHEN 500 MG PO TABS
1000.0000 mg | ORAL_TABLET | Freq: Once | ORAL | Status: AC
  Administered 2018-07-01: 1000 mg via ORAL
  Filled 2018-07-01: qty 2

## 2018-07-01 NOTE — ED Notes (Signed)
ED Provider at bedside. 

## 2018-07-01 NOTE — ED Provider Notes (Signed)
[facility] EMERGENCY DEPARTMENT Provider Note   CSN: [id number] Arrival date & time: 06/30/18  2036     History   Chief Complaint Chief Complaint  Patient presents with  . Chest Pain    HPI Wendy Berry is a 32 y.o. female.  Wendy Berry is a 32 y.o. female with a history of paranoid schizophrenia, MR, heart murmur and cerebral aneurysm, who presents to the emergency department for evaluation of chest pains.  She reports chest pains have been present intermittently over the past week.  She reports most recent bout of pains started 1 week ago and is located over the middle of her chest.  Pain does not radiate to the arm neck or jaw.  Is not made better or worse with exertion.  She denies any associated shortness of breath or chest tightness.  Pain is not pleuritic in nature.  She denies any associated diaphoresis, nausea or vomiting.  No lightheadedness or syncope.  No associated abdominal pain.  She has not taken anything to treat this pain prior to arrival, denies any other aggravating or alleviating factors.  She also reports a mild headache across her forehead that started this morning, no associated vision changes, no numbness, weakness or tingling in her extremities, no neck pain or stiffness, no fevers or chills.  She has not taken anything to treat this headache, reports history of similar headaches occasionally. Denies exogenous estrogen use, lower extremity pain or swelling, recent travel or immobilization, history of PE or DVT, family or personal history of bleeding or clotting disorders, cough or hemoptysis.  Denies tobacco abuse, alcohol use or any other illicit drugs.     Past Medical History:  Diagnosis Date  . Cerebral aneurysm   . Heart murmur   . Mental retardation   . Paranoid schizophrenia [facility])     Patient Active Problem List   Diagnosis Date Noted  . Hallucinations   . Involuntary commitment   . 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 BRAIN SURGERY       OB History   No obstetric history on file.      Home Medications    Prior to Admission medications   Medication Sig Start Date End Date Taking? Authorizing Provider  amantadine (SYMMETREL) 100 MG capsule Take 1 capsule (100 mg total) by mouth 2 (two) times daily. Patient not taking: Reported on 06/14/2017 05/02/16   Charm Rings, NP  hydrOXYzine (ATARAX/VISTARIL) 25 MG tablet Take 1 tablet (25 mg total) by mouth every 6 (six) hours as needed for anxiety. 11/18/17   Harris, Cammy Copa, PA-C  levocetirizine (XYZAL) 5 MG tablet Take 1 tablet (5 mg total) by mouth every evening. Patient not taking: Reported on 05/09/2017 05/24/16   Deatra Canter, FNP  meloxicam (MOBIC) 7.5 MG tablet Take 1 tablet (7.5 mg total) by mouth daily. AS NEEDED FOR HEADACHE. 11/18/17   Arthor Captain, PA-C  Oxcarbazepine (TRILEPTAL) 300 MG tablet Take 1 tablet (300 mg total) by mouth 2 (two) times daily. Patient not taking: Reported on 05/09/2017 05/02/16   Charm Rings, NP  paliperidone (INVEGA SUSTENNA) 234 MG/1.5ML SUSP injection Inject 234 mg into the muscle every 28 (twenty-eight) days.    [provider]  paliperidone (INVEGA) 3 MG 24 hr tablet Take 1 tablet (3 mg total) by mouth daily. Patient not taking: Reported on 05/09/2017 05/03/16   Charm Rings, NP  topiramate (  TOPAMAX) 50 MG tablet Take 1 tablet (50 mg total) by mouth 2 (two) times daily. 06/14/17   Hedges, Tinnie GensJeffrey, PA-C  triamterene-hydrochlorothiazide (MAXZIDE-25) 37.5-25 MG tablet Take 1 tablet by mouth daily. Patient not taking: Reported on 05/09/2017 06/02/16   Elvina SidleLauenstein, Kurt, MD    Family History No family history on file.  Social History Social History   Tobacco Use  . Smoking status: Never Smoker  . Smokeless tobacco: Never Used  Substance Use Topics  . Alcohol use: No  . Drug use: No     Allergies   Patient has no  known allergies.   Review of Systems Review of Systems  Constitutional: Negative for chills and fever.  HENT: Negative.   Eyes: Negative for visual disturbance.  Respiratory: Negative for cough, chest tightness, shortness of breath and wheezing.   Cardiovascular: Positive for chest pain. Negative for palpitations and leg swelling.  Gastrointestinal: Negative for abdominal pain, nausea and vomiting.  Genitourinary: Negative for dysuria and frequency.  Musculoskeletal: Negative for arthralgias, myalgias, neck pain and neck stiffness.  Skin: Negative for color change.  Neurological: Positive for headaches. Negative for dizziness, weakness, light-headedness and numbness.     Physical Exam Updated Vital Signs BP (!) 142/98 (BP Location: Left Arm)   Pulse 86   Temp 98 F (36.7 C) (Oral)   Resp 18   Ht 5\' 4"  (1.626 m)   Wt 72.6 kg   LMP 06/27/2018   SpO2 100%   BMI 27.46 kg/m   Physical Exam Vitals signs and nursing note reviewed.  Constitutional:      General: She is not in acute distress.    Appearance: She is well-developed and normal weight. She is not ill-appearing or diaphoretic.     Comments: On arrival to the room patient is talking loudly on her cell phone and appears to be in no acute distress  HENT:     Head: Normocephalic and atraumatic.  Eyes:     General:        Right eye: No discharge.        Left eye: No discharge.     Pupils: Pupils are equal, round, and reactive to light.  Neck:     Musculoskeletal: Neck supple.  Cardiovascular:     Rate and Rhythm: Normal rate and regular rhythm.     Pulses:          Radial pulses are 2+ on the right side and 2+ on the left side.       Dorsalis pedis pulses are 2+ on the right side and 2+ on the left side.     Heart sounds: Normal heart sounds. No murmur. No friction rub. No gallop.   Pulmonary:     Effort: Pulmonary effort is normal. No respiratory distress.     Breath sounds: Normal breath sounds. No wheezing or  rales.     Comments: Respirations equal and unlabored, patient able to speak in full sentences, lungs clear to auscultation bilaterally Chest:     Chest wall: No tenderness.  Abdominal:     General: Bowel sounds are normal. There is no distension.     Palpations: Abdomen is soft. There is no mass.     Tenderness: There is no abdominal tenderness. There is no guarding.     Comments: Abdomen soft, nondistended, nontender to palpation in all quadrants without guarding or peritoneal signs  Musculoskeletal:        General: No deformity.     Right lower leg:  She exhibits no tenderness. No edema.     Left lower leg: She exhibits no tenderness. No edema.  Skin:    General: Skin is warm and dry.     Capillary Refill: Capillary refill takes less than 2 seconds.  Neurological:     General: No focal deficit present.     Mental Status: She is alert and oriented to person, place, and time.     Coordination: Coordination normal.     Comments: Speech is clear, able to follow commands CN III-XII intact Normal strength in upper and lower extremities bilaterally including dorsiflexion and plantar flexion, strong and equal grip strength Sensation normal to light and sharp touch Moves extremities without ataxia, coordination intact  Psychiatric:        Mood and Affect: Mood normal.        Behavior: Behavior normal.      ED Treatments / Results  Labs (all labs ordered are listed, but only abnormal results are displayed) Labs Reviewed  BASIC METABOLIC PANEL - Abnormal; Notable for the following components:      Result Value   Potassium 3.4 (*)    Creatinine, Ser 1.06 (*)    All other components within normal limits  CBC  I-STAT TROPONIN, ED  I-STAT BETA HCG BLOOD, ED ([facility], [facility], [facility] ONLY)  I-STAT TROPONIN, ED    EKG None  Radiology Dg Chest 2 View  Result Date: 06/30/2018 CLINICAL DATA:  Chest pain and headache. EXAM: CHEST - 2 VIEW COMPARISON:  08/27/2015 FINDINGS: The cardiomediastinal  silhouette is unchanged with upper limits of normal heart size. The lungs remain mildly hypoinflated, and there is mild peribronchial thickening. No confluent airspace opacity, pleural effusion, or pneumothorax is identified. No acute osseous abnormality is seen. IMPRESSION: Mild peribronchial thickening without consolidation. Electronically Signed   By: Sebastian AcheAllen  Grady M.D.   On: 06/30/2018 21:51    Procedures Procedures (including critical care time)  Medications Ordered in ED Medications  potassium chloride SA (K-DUR,KLOR-CON) CR tablet 40 mEq (has no administration in time range)  acetaminophen (TYLENOL) tablet 1,000 mg (1,000 mg Oral Given 07/01/18 0059)     Initial Impression / Assessment and Plan / ED Course  I have reviewed the triage vital signs and the nursing notes.  Pertinent labs & imaging results that were available during my care of the patient were reviewed by me and considered in my medical decision making (see chart for details).  Patient is to be discharged with recommendation to follow up with PCP in regards to today's hospital visit. Chest pain is not likely of cardiac or pulmonary etiology d/t presentation, PERC negative, VSS, no tracheal deviation, no JVD or new murmur, RRR, breath sounds equal bilaterally, EKG without acute abnormalities, negative troponin x 2, and negative CXR.  Mild headache, resolved with Tylenol, no neurologic deficits, no rigidity, no concern for meningitis, SAH, ICH or other acute intracranial pathology.  Pt has been advised to return to the ED if CP becomes exertional, associated with diaphoresis or nausea, radiates to left jaw/arm, worsens or becomes concerning in any way. Pt appears reliable for follow up and is agreeable to discharge.   Final Clinical Impressions(s) / ED Diagnoses   Final diagnoses:  Atypical chest pain  Acute nonintractable headache, unspecified headache type    ED Discharge Orders    None       Legrand RamsFord, Aaryn Sermon N,  PA-C 07/01/18 0128    Shaune PollackIsaacs, Cameron, MD 07/01/18 1705

## 2018-07-01 NOTE — ED Notes (Signed)
Pt refused to sign DC instructions. 
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
## 2018-07-01 NOTE — Discharge Instructions (Addendum)
Follow-up with your regular doctor regarding the chest pain you have been experiencing.  Your work-up here is reassuring and does not suggest an acute problem with your heart or lungs causing your symptoms.  You may take Tylenol as needed for pain.  Return to the emergency department if you experience worsening chest pain especially if it is worse with exertion, shortness of breath, lightheadedness or feeling like you may pass out, fevers, abdominal pain or any other new or concerning symptoms.

## 2018-09-06 ENCOUNTER — Other Ambulatory Visit: Payer: Self-pay

## 2018-09-06 ENCOUNTER — Encounter (HOSPITAL_COMMUNITY): Payer: Self-pay

## 2018-09-06 ENCOUNTER — Emergency Department (HOSPITAL_COMMUNITY)
Admission: EM | Admit: 2018-09-06 | Discharge: 2018-09-06 | Disposition: A | Discharge status: Home / Self Care | Payer: Medicare Other | Attending: Emergency Medicine | Admitting: Emergency Medicine

## 2018-09-06 ENCOUNTER — Ambulatory Visit: Payer: Self-pay | Admitting: Family Medicine

## 2018-09-06 DIAGNOSIS — Z0000 Encounter for general adult medical examination without abnormal findings: Secondary | ICD-10-CM | POA: Diagnosis not present

## 2018-09-06 NOTE — Discharge Instructions (Addendum)
You were evaluated in the Emergency Department and after careful evaluation, we did not find any emergent condition requiring admission or further testing in the hospital.  Please follow-up with the primary care doctors at a [facility] community health and wellness center.

## 2018-09-06 NOTE — ED Notes (Signed)
Patient verbalizes understanding of discharge instructions. Opportunity for questioning and answers were provided. Armband removed by staff, pt discharged from ED.  

## 2018-09-06 NOTE — ED Provider Notes (Signed)
[facility] Emergency Department Provider Note MRN:  [id number]  Arrival date & time: 09/06/18     Chief Complaint   Well visit History of Present Illness   Wendy Berry is a 33 y.o. year-old female with a history of paranoid schizophrenia presenting to the ED with chief complaint of well visit.  Patient explains that she is trying to get her license back, the DMV is requiring her to have some paperwork filled out.  States she was told to come to the hospital to get it filled out.  Does not have a PCP.  Patient denies headache or vision change, no chest pain or shortness of breath, no abdominal pain, no symptoms, no SI, no HI, no AVH, states that her mood is "good".  States that she is not taking any medications currently.  Review of Systems  A complete 10 system review of systems was obtained and all systems are negative except as noted in the HPI and PMH.   Patient's Health History    Past Medical History:  Diagnosis Date  . Cerebral aneurysm   . Heart murmur   . Mental retardation   . Paranoid schizophrenia [facility])     Past Surgical History:  Procedure Laterality Date  . BRAIN SURGERY      History reviewed. No pertinent family history.  Social History   Socioeconomic History  . Marital status: Single    Spouse name: Not on file  . Number of children: Not on file  . Years of education: Not on file  . Highest education level: Not on file  Occupational History  . Not on file  Social Needs  . Financial resource strain: Not on file  . Food insecurity:    Worry: Not on file    Inability: Not on file  . Transportation needs:    Medical: Not on file    Non-medical: Not on file  Tobacco Use  . Smoking status: Never Smoker  . Smokeless tobacco: Never Used  Substance and Sexual Activity  . Alcohol use: No  . Drug use: No  . Sexual activity: Not on file  Lifestyle  . Physical activity:    Days per week: Not on file    Minutes per session: Not on  file  . Stress: Not on file  Relationships  . Social connections:    Talks on phone: Not on file    Gets together: Not on file    Attends religious service: Not on file    Active member of club or organization: Not on file    Attends meetings of clubs or organizations: Not on file    Relationship status: Not on file  . Intimate partner violence:    Fear of current or ex partner: Not on file    Emotionally abused: Not on file    Physically abused: Not on file    Forced sexual activity: Not on file  Other Topics Concern  . Not on file  Social History Narrative  . Not on file     Physical Exam  Vital Signs and Nursing Notes reviewed Vitals:   09/06/18 1603 09/06/18 1604  BP: (!) 136/94   Pulse:  (!) 102  SpO2: 100%     CONSTITUTIONAL: Well-appearing, NAD NEURO:  Alert and oriented x 3, no focal deficits EYES:  eyes equal and reactive ENT/NECK:  no LAD, no JVD CARDIO: Regular rate, well-perfused, normal S1 and S2 PULM:  CTAB no wheezing or rhonchi GI/GU:  normal bowel sounds, non-distended, non-tender MSK/SPINE:  No gross deformities, no edema SKIN:  no rash, atraumatic PSYCH:  Appropriate speech and behavior  Diagnostic and Interventional Summary    Labs Reviewed - No data to display  No orders to display    Medications - No data to display   Procedures Critical Care  ED Course and Medical Decision Making  I have reviewed the triage vital signs and the nursing notes.  Pertinent labs & imaging results that were available during my care of the patient were reviewed by me and considered in my medical decision making (see below for details).  Largely an appropriate emergency department visit in this 33 year old female with history of schizophrenia, MR per chart review.  Referred her to primary care to have her paperwork filled out.  Patient was examined and based on her lack of complaints was deemed safe for discharge.  After the discussed management above, the  patient was determined to be safe for discharge.  The patient was in agreement with this plan and all questions regarding their care were answered.  ED return precautions were discussed and the patient will return to the ED with any significant worsening of condition.  Elmer Sow. Pilar Plate, MD [facility] Emergency Medicine [facility] [email or web address]  Final Clinical Impressions(s) / ED Diagnoses     ICD-10-CM   1. Medicare annual wellness visit, initial Z00.00     ED Discharge Orders    None         Sabas Sous, MD 09/06/18 [phone]

## 2018-09-06 NOTE — ED Triage Notes (Signed)
Pt does not have CP, but rather wants a DMV form completed.

## 2018-09-11 ENCOUNTER — Encounter (HOSPITAL_COMMUNITY): Payer: Self-pay | Admitting: *Deleted

## 2018-09-11 ENCOUNTER — Emergency Department (HOSPITAL_COMMUNITY): Payer: Medicare Other

## 2018-09-11 ENCOUNTER — Emergency Department (HOSPITAL_COMMUNITY)
Admission: EM | Admit: 2018-09-11 | Discharge: 2018-09-12 | Disposition: A | Discharge status: Home / Self Care | Payer: Medicare Other | Attending: Emergency Medicine | Admitting: Emergency Medicine

## 2018-09-11 ENCOUNTER — Other Ambulatory Visit: Payer: Self-pay

## 2018-09-11 DIAGNOSIS — Z1383 Encounter for screening for respiratory disorder NEC: Secondary | ICD-10-CM | POA: Diagnosis present

## 2018-09-11 DIAGNOSIS — F200 Paranoid schizophrenia: Secondary | ICD-10-CM | POA: Insufficient documentation

## 2018-09-11 DIAGNOSIS — Z711 Person with feared health complaint in whom no diagnosis is made: Secondary | ICD-10-CM

## 2018-09-11 DIAGNOSIS — R4582 Worries: Secondary | ICD-10-CM | POA: Insufficient documentation

## 2018-09-11 DIAGNOSIS — F79 Unspecified intellectual disabilities: Secondary | ICD-10-CM | POA: Insufficient documentation

## 2018-09-11 LAB — CBC
HCT: 37.5 % (ref 36.0–46.0)
Hemoglobin: 11.8 g/dL — ABNORMAL LOW (ref 12.0–15.0)
MCH: 28.4 pg (ref 26.0–34.0)
MCHC: 31.5 g/dL (ref 30.0–36.0)
MCV: 90.1 fL (ref 80.0–100.0)
PLATELETS: 340 10*3/uL (ref 150–400)
RBC: 4.16 MIL/uL (ref 3.87–5.11)
RDW: 14.8 % (ref 11.5–15.5)
WBC: 9.3 10*3/uL (ref 4.0–10.5)
nRBC: 0 % (ref 0.0–0.2)

## 2018-09-11 LAB — I-STAT BETA HCG BLOOD, ED (MC, WL, AP ONLY): I-stat hCG, quantitative: <5 m[IU]/mL (ref <5)

## 2018-09-11 LAB — BASIC METABOLIC PANEL
ANION GAP: 7 (ref 5–15)
BUN: 15 mg/dL (ref 6–20)
CO2: 23 mmol/L (ref 22–32)
Calcium: 9 mg/dL (ref 8.9–10.3)
Chloride: 106 mmol/L (ref 98–111)
Creatinine, Ser: 0.83 mg/dL (ref 0.44–1.00)
GFR calc Af Amer: >60 mL/min (ref >60)
GFR calc non Af Amer: >60 mL/min (ref >60)
Glucose, Bld: 85 mg/dL (ref 70–99)
Potassium: 4.2 mmol/L (ref 3.5–5.1)
Sodium: 136 mmol/L (ref 135–145)

## 2018-09-11 LAB — I-STAT TROPONIN, ED: TROPONIN I, POC: 0 ng/mL (ref 0.00–0.08)

## 2018-09-11 MED ORDER — SODIUM CHLORIDE 0.9% FLUSH: 3.0000 mL | Freq: Once | INTRAVENOUS | Status: DC
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
## 2018-09-12 NOTE — Discharge Instructions (Signed)
All your tests today looked good. Follow-up with the doctor listed on your medicaid card-- if any issues, can try called the 800 number to find another clinic. Return here for any new/acute changes.

## 2018-10-22 ENCOUNTER — Telehealth: Payer: Self-pay | Admitting: Emergency Medicine

## 2018-10-22 NOTE — Telephone Encounter (Signed)
Called pt not able to leave message VM full. Tried home # no answer . FR needed to convert appt to [employer]

## 2018-10-25 ENCOUNTER — Telehealth: Payer: Self-pay | Admitting: *Deleted

## 2018-10-25 ENCOUNTER — Ambulatory Visit: Payer: Medicare Other | Admitting: Emergency Medicine

## 2018-10-25 NOTE — Telephone Encounter (Signed)
Called patient to triage for 9:00 am appointment at [phone]. No answer, automated voice states mail box is full.

## 2018-11-14 ENCOUNTER — Ambulatory Visit: Payer: Medicare Other | Admitting: Emergency Medicine

## 2018-11-27 ENCOUNTER — Emergency Department (HOSPITAL_COMMUNITY)
Admission: EM | Admit: 2018-11-27 | Discharge: 2018-11-27 | Disposition: A | Discharge status: Home / Self Care | Payer: Medicare Other | Attending: Emergency Medicine | Admitting: Emergency Medicine

## 2018-11-27 ENCOUNTER — Other Ambulatory Visit: Payer: Self-pay

## 2018-11-27 DIAGNOSIS — F79 Unspecified intellectual disabilities: Secondary | ICD-10-CM | POA: Insufficient documentation

## 2018-11-27 DIAGNOSIS — Z3202 Encounter for pregnancy test, result negative: Secondary | ICD-10-CM | POA: Diagnosis not present

## 2018-11-27 DIAGNOSIS — Z3200 Encounter for pregnancy test, result unknown: Secondary | ICD-10-CM | POA: Diagnosis present

## 2018-11-27 LAB — POC URINE PREG, ED: Preg Test, Ur: NEGATIVE

## 2018-11-27 NOTE — ED Triage Notes (Signed)
Pt took at-home test and did not know how to use it, but thinks she is pregnant.

## 2018-11-27 NOTE — ED Provider Notes (Signed)
[facility]   CSN: [id number] Arrival date & time: 11/27/18  2007    History   Chief Complaint Chief Complaint  Patient presents with  . Possible Pregnancy    HPI Wendy Berry is a 33 y.o. female.     Patient presents to the ED with concern for pregnancy. Patient states she is attempting to become pregnant, had sex yesterday. She attempted home pregnancy test but did not know how to use it.   The history is provided by the patient.  Possible Pregnancy  This is a new problem. The current episode started yesterday. Pertinent negatives include no abdominal pain.    Past Medical History:  Diagnosis Date  . Cerebral aneurysm   . Heart murmur   . Mental retardation   . Paranoid schizophrenia [facility](HCC)     Patient Active Problem List   Diagnosis Date Noted  . 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 BRAIN SURGERY       OB History   No obstetric history on file.      Home Medications    Prior to Admission medications   Medication Sig Start Date End Date Taking? Authorizing Provider  amantadine (SYMMETREL) 100 MG capsule Take 1 capsule (100 mg total) by mouth 2 (two) times daily. Patient not taking: Reported on 06/14/2017 05/02/16   Charm RingsLord, Jamison Y, NP  hydrOXYzine (ATARAX/VISTARIL) 25 MG tablet Take 1 tablet (25 mg total) by mouth every 6 (six) hours as needed for anxiety. Patient not taking: Reported on 07/01/2018 11/18/17   Arthor CaptainHarris, Abigail, PA-C  levocetirizine (XYZAL) 5 MG tablet Take 1 tablet (5 mg total) by mouth every evening. Patient not taking: Reported on 05/09/2017 05/24/16   Deatra Canterxford, William J, FNP  meloxicam (MOBIC) 7.5 MG tablet Take 1 tablet (7.5 mg total) by mouth daily. AS NEEDED FOR HEADACHE. Patient not taking: Reported on 07/01/2018 11/18/17   Arthor CaptainHarris, Abigail, PA-C  Oxcarbazepine (TRILEPTAL) 300 MG tablet Take 1 tablet (300 mg  total) by mouth 2 (two) times daily. Patient not taking: Reported on 05/09/2017 05/02/16   Charm RingsLord, Jamison Y, NP  paliperidone (INVEGA) 3 MG 24 hr tablet Take 1 tablet (3 mg total) by mouth daily. Patient not taking: Reported on 05/09/2017 05/03/16   Charm RingsLord, Jamison Y, NP  topiramate (TOPAMAX) 50 MG tablet Take 1 tablet (50 mg total) by mouth 2 (two) times daily. Patient not taking: Reported on 07/01/2018 06/14/17   Hedges, Tinnie GensJeffrey, PA-C  triamterene-hydrochlorothiazide (MAXZIDE-25) 37.5-25 MG tablet Take 1 tablet by mouth daily. Patient not taking: Reported on 05/09/2017 06/02/16   Elvina SidleLauenstein, Kurt, MD    Family History No family history on file.  Social History Social History   Tobacco Use  . Smoking status: Never Smoker  . Smokeless tobacco: Never Used  Substance Use Topics  . Alcohol use: No  . Drug use: No     Allergies   Patient has no known allergies.   Review of Systems Review of Systems  Gastrointestinal: Negative for abdominal pain.  All other systems reviewed and are negative.    Physical Exam Updated Vital Signs BP (!) 142/94 (BP Location: Left Arm)   Pulse 92   Temp 98.2 F (36.8 C) (Oral)   Resp 17   Ht 5\' 4"  (1.626 m)   Wt 86.2 kg   LMP 11/20/2018 (Approximate)   SpO2 100%   BMI 32.61 kg/m   Physical Exam  Vitals signs and nursing note reviewed.  Constitutional:      Appearance: Normal appearance.  Eyes:     Conjunctiva/sclera: Conjunctivae normal.  Cardiovascular:     Rate and Rhythm: Normal rate.  Pulmonary:     Effort: Pulmonary effort is normal.  Abdominal:     Palpations: Abdomen is soft.  Musculoskeletal: Normal range of motion.  Skin:    General: Skin is warm and dry.  Neurological:     Mental Status: She is alert and oriented to person, place, and time.  Psychiatric:        Mood and Affect: Mood normal.      ED Treatments / Results  Labs (all labs ordered are listed, but only abnormal results are displayed) Labs Reviewed  POC  URINE PREG, ED    EKG None  Radiology No results found.  Procedures Procedures (including critical care time)  Medications Ordered in ED Medications - No data to display   Initial Impression / Assessment and Plan / ED Course  I have reviewed the triage vital signs and the nursing notes.  Pertinent labs & imaging results that were available during my care of the patient were reviewed by me and considered in my medical decision making (see chart for details).        Pregnancy test negative. Explained to patient that home pregnancy tests should be performed about 2 weeks after first missed period. Patient without other current concerns.  Discharged home.  Final Clinical Impressions(s) / ED Diagnoses   Final diagnoses:  Negative pregnancy test    ED Discharge Orders    None       Felicie Morn, NP 11/27/18 2119    Terrilee Files, MD 11/28/18 1630

## 2018-11-27 NOTE — ED Notes (Signed)
Patient verbalizes understanding of discharge instructions. Opportunity for questioning and answers were provided. Armband removed by staff, pt discharged from ED.  

## 2018-12-03 ENCOUNTER — Other Ambulatory Visit: Payer: Self-pay

## 2018-12-03 ENCOUNTER — Emergency Department (HOSPITAL_COMMUNITY)
Admission: EM | Admit: 2018-12-03 | Discharge: 2018-12-04 | Disposition: A | Discharge status: Other Acute Inpatient Hospital | Payer: Medicare Other | Attending: Emergency Medicine | Admitting: Emergency Medicine

## 2018-12-03 ENCOUNTER — Encounter (HOSPITAL_COMMUNITY): Payer: Self-pay | Admitting: Emergency Medicine

## 2018-12-03 DIAGNOSIS — R451 Restlessness and agitation: Secondary | ICD-10-CM | POA: Diagnosis not present

## 2018-12-03 DIAGNOSIS — F22 Delusional disorders: Secondary | ICD-10-CM | POA: Diagnosis not present

## 2018-12-03 DIAGNOSIS — Z046 Encounter for general psychiatric examination, requested by authority: Secondary | ICD-10-CM | POA: Diagnosis not present

## 2018-12-03 DIAGNOSIS — F79 Unspecified intellectual disabilities: Secondary | ICD-10-CM | POA: Diagnosis not present

## 2018-12-03 DIAGNOSIS — Z03818 Encounter for observation for suspected exposure to other biological agents ruled out: Secondary | ICD-10-CM | POA: Diagnosis not present

## 2018-12-03 LAB — CBC WITH DIFFERENTIAL/PLATELET
Abs Immature Granulocytes: 0.02 10*3/uL (ref 0.00–0.07)
Basophils Absolute: 0 10*3/uL (ref 0.0–0.1)
Basophils Relative: 0 %
Eosinophils Absolute: 0.1 10*3/uL (ref 0.0–0.5)
Eosinophils Relative: 1 %
HCT: 39.8 % (ref 36.0–46.0)
Hemoglobin: 13 g/dL (ref 12.0–15.0)
Immature Granulocytes: 0 %
Lymphocytes Relative: 27 %
Lymphs Abs: 2.1 10*3/uL (ref 0.7–4.0)
MCH: 28.8 pg (ref 26.0–34.0)
MCHC: 32.7 g/dL (ref 30.0–36.0)
MCV: 88.1 fL (ref 80.0–100.0)
Monocytes Absolute: 0.7 10*3/uL (ref 0.1–1.0)
Monocytes Relative: 9 %
Neutro Abs: 4.9 10*3/uL (ref 1.7–7.7)
Neutrophils Relative %: 63 %
Platelets: 396 10*3/uL (ref 150–400)
RBC: 4.52 MIL/uL (ref 3.87–5.11)
RDW: 13.1 % (ref 11.5–15.5)
WBC: 7.8 10*3/uL (ref 4.0–10.5)
nRBC: 0 % (ref 0.0–0.2)

## 2018-12-03 LAB — COMPREHENSIVE METABOLIC PANEL
ALT: 16 U/L (ref 0–44)
AST: 19 U/L (ref 15–41)
Albumin: 3.5 g/dL (ref 3.5–5.0)
Alkaline Phosphatase: 73 U/L (ref 38–126)
Anion gap: 9 (ref 5–15)
BUN: 17 mg/dL (ref 6–20)
CO2: 24 mmol/L (ref 22–32)
Calcium: 9.3 mg/dL (ref 8.9–10.3)
Chloride: 103 mmol/L (ref 98–111)
Creatinine, Ser: 1.12 mg/dL — ABNORMAL HIGH (ref 0.44–1.00)
GFR calc Af Amer: >60 mL/min (ref >60)
GFR calc non Af Amer: >60 mL/min (ref >60)
Glucose, Bld: 103 mg/dL — ABNORMAL HIGH (ref 70–99)
Potassium: 3.8 mmol/L (ref 3.5–5.1)
Sodium: 136 mmol/L (ref 135–145)
Total Bilirubin: 0.7 mg/dL (ref 0.3–1.2)
Total Protein: 8.1 g/dL (ref 6.5–8.1)

## 2018-12-03 LAB — URINALYSIS, COMPLETE (UACMP) WITH MICROSCOPIC
Bacteria, UA: NONE SEEN
Bilirubin Urine: NEGATIVE
Glucose, UA: NEGATIVE mg/dL
Hgb urine dipstick: NEGATIVE
Ketones, ur: NEGATIVE mg/dL
Nitrite: NEGATIVE
Protein, ur: NEGATIVE mg/dL
Specific Gravity, Urine: 1.026 (ref 1.005–1.030)
pH: 5 (ref 5.0–8.0)

## 2018-12-03 LAB — RAPID URINE DRUG SCREEN, HOSP PERFORMED
Amphetamines: NOT DETECTED
Barbiturates: NOT DETECTED
Benzodiazepines: NOT DETECTED
Cocaine: NOT DETECTED
Opiates: NOT DETECTED
Tetrahydrocannabinol: NOT DETECTED

## 2018-12-03 LAB — I-STAT BETA HCG BLOOD, ED (MC, WL, AP ONLY): I-stat hCG, quantitative: <5 m[IU]/mL (ref <5)

## 2018-12-03 LAB — ETHANOL: Alcohol, Ethyl (B): <10 mg/dL (ref <10)

## 2018-12-03 MED ORDER — STERILE WATER FOR INJECTION IJ SOLN
INTRAMUSCULAR | Status: AC
  Filled 2018-12-03: qty 10

## 2018-12-03 MED ORDER — ZIPRASIDONE MESYLATE 20 MG IM SOLR
20.0000 mg | Freq: Once | INTRAMUSCULAR | Status: AC
  Administered 2018-12-03: 20 mg via INTRAMUSCULAR
  Filled 2018-12-03: qty 20

## 2018-12-03 MED ORDER — LORAZEPAM 2 MG/ML IJ SOLN
1.0000 mg | Freq: Once | INTRAMUSCULAR | Status: AC
  Administered 2018-12-03: 1 mg via INTRAMUSCULAR
  Filled 2018-12-03: qty 1

## 2018-12-03 NOTE — ED Triage Notes (Signed)
Pt to ED via [facility]  In hand cuffs ref. Behavior.  Pt yelling stating she hates all white people, thinks she is being stalked by people.

## 2018-12-03 NOTE — ED Notes (Signed)
Pt changed into purple scrubs.  Security in to wand pt.

## 2018-12-03 NOTE — ED Provider Notes (Signed)
[facility] Note   CSN: [id number] Arrival date & time: 12/03/18  1530    History   Chief Complaint Chief Complaint  Patient presents with  . Medical Clearance    HPI Wendy Berry is a 33 y.o. female.     HPI   33yF brought in by police for psychiatric evaluation. She is very paranoid. She saying people are stalking her and that she doesn't trust white people. Police were called by her mother because pt was arguing and fighting with her. Police were unable to reason with her. They tried to get her to leave the area and then she started getting into it with neighbors and then her mother again. Police state that they believe she has a psychiatric history and they are repeatedly called to the residence because of her behavior.   They kept trying to deescalate the situation but were unsuccessful and felt it would be best if she had a psychiatric evaluation.  Pt is not very forthright with information to me. She makes very little eye contact and instead keeps trying to talk to the [place] Development worker, international aid[place] police officer in the room.   Past Medical History:  Diagnosis Date  . Cerebral aneurysm   . Heart murmur   . Mental retardation   . Paranoid schizophrenia [facility](HCC)     Patient Active Problem List   Diagnosis Date Noted  . 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 BRAIN SURGERY       OB History   No obstetric history on file.      Home Medications    Prior to Admission medications   Medication Sig Start Date End Date Taking? Authorizing Provider  amantadine (SYMMETREL) 100 MG capsule Take 1 capsule (100 mg total) by mouth 2 (two) times daily. Patient not taking: Reported on 06/14/2017 05/02/16   Charm RingsLord, Jamison Y, NP  hydrOXYzine (ATARAX/VISTARIL) 25 MG tablet Take 1 tablet (25 mg total) by mouth every 6 (six) hours as needed for anxiety. Patient not taking:  Reported on 07/01/2018 11/18/17   Arthor CaptainHarris, Abigail, PA-C  levocetirizine (XYZAL) 5 MG tablet Take 1 tablet (5 mg total) by mouth every evening. Patient not taking: Reported on 05/09/2017 05/24/16   Deatra Canterxford, William J, FNP  meloxicam (MOBIC) 7.5 MG tablet Take 1 tablet (7.5 mg total) by mouth daily. AS NEEDED FOR HEADACHE. Patient not taking: Reported on 07/01/2018 11/18/17   Arthor CaptainHarris, Abigail, PA-C  Oxcarbazepine (TRILEPTAL) 300 MG tablet Take 1 tablet (300 mg total) by mouth 2 (two) times daily. Patient not taking: Reported on 05/09/2017 05/02/16   Charm RingsLord, Jamison Y, NP  paliperidone (INVEGA) 3 MG 24 hr tablet Take 1 tablet (3 mg total) by mouth daily. Patient not taking: Reported on 05/09/2017 05/03/16   Charm RingsLord, Jamison Y, NP  topiramate (TOPAMAX) 50 MG tablet Take 1 tablet (50 mg total) by mouth 2 (two) times daily. Patient not taking: Reported on 07/01/2018 06/14/17   Hedges, Tinnie GensJeffrey, PA-C  triamterene-hydrochlorothiazide (MAXZIDE-25) 37.5-25 MG tablet Take 1 tablet by mouth daily. Patient not taking: Reported on 05/09/2017 06/02/16   Elvina SidleLauenstein, Kurt, MD    Family History No family history on file.  Social History Social History   Tobacco Use  . Smoking status: Never Smoker  . Smokeless tobacco: Never Used  Substance Use Topics  . Alcohol use: No  . Drug use: No     Allergies   Patient has  no known allergies.   Review of Systems Review of Systems  Level 5 caveat because of psychiatric illness.   Physical Exam Updated Vital Signs Temp 98 F (36.7 C) (Oral)   LMP 11/20/2018 (Approximate)   Physical Exam Vitals signs and nursing note reviewed.  Constitutional:      Appearance: She is well-developed. She is obese.  HENT:     Head: Normocephalic and atraumatic.  Eyes:     General:        Right eye: No discharge.        Left eye: No discharge.     Conjunctiva/sclera: Conjunctivae normal.  Neck:     Musculoskeletal: Neck supple.  Cardiovascular:     Rate and Rhythm: Normal  rate and regular rhythm.     Heart sounds: Normal heart sounds. No murmur. No friction rub. No gallop.   Pulmonary:     Effort: Pulmonary effort is normal. No respiratory distress.     Breath sounds: Normal breath sounds.  Abdominal:     General: There is no distension.     Palpations: Abdomen is soft.     Tenderness: There is no abdominal tenderness.  Musculoskeletal:        General: No tenderness.  Skin:    General: Skin is warm and dry.  Neurological:     Mental Status: She is alert.  Psychiatric:     Comments: Agitated. Voice is loud and speech pressured. Poor eye contact. Seems paranoid.       ED Treatments / Results  Labs (all labs ordered are listed, but only abnormal results are displayed) Labs Reviewed  COMPREHENSIVE METABOLIC PANEL - Abnormal; Notable for the following components:      Result Value   Glucose, Bld 103 (*)    Creatinine, Ser 1.12 (*)    All other components within normal limits  URINALYSIS, COMPLETE (UACMP) WITH MICROSCOPIC - Abnormal; Notable for the following components:   APPearance HAZY (*)    Leukocytes,Ua TRACE (*)    All other components within normal limits  SARS CORONAVIRUS 2 (HOSPITAL ORDER, PERFORMED IN [facility])  ETHANOL  RAPID URINE DRUG SCREEN, HOSP PERFORMED  CBC WITH DIFFERENTIAL/PLATELET  I-STAT BETA HCG BLOOD, ED ([facility], [facility], [facility] ONLY)    EKG None  Radiology No results found.  Procedures Procedures (including critical care time)  CRITICAL CARE Performed by: Raeford Razor Total critical care time: 35 minutes Critical care time was exclusive of separately billable procedures and treating other patients. Critical care was necessary to treat or prevent imminent or life-threatening deterioration. Critical care was time spent personally by me on the following activities: development of treatment plan with patient and/or surrogate as well as nursing, discussions with consultants, evaluation of patient's response to  treatment, examination of patient, obtaining history from patient or surrogate, ordering and performing treatments and interventions, ordering and review of laboratory studies, ordering and review of radiographic studies, pulse oximetry and re-evaluation of patient's condition.   Medications Ordered in ED Medications  sterile water (preservative free) injection (has no administration in time range)  ziprasidone (GEODON) injection 20 mg (20 mg Intramuscular Given by Other 12/03/18 1600)  LORazepam (ATIVAN) injection 1 mg (1 mg Intramuscular Given 12/03/18 1601)     Initial Impression / Assessment and Plan / ED Course  I have reviewed the triage vital signs and the nursing notes.  Pertinent labs & imaging results that were available during my care of the patient were reviewed by me and considered  in my medical decision making (see chart for details).     33yF with agitation/paranoia. She has a history of paranoid schizophrenia. Not sure if she has been taking medications or not. She says no but not reliable. Had to be chemically restrained for safety reasons. Now medically cleared for psychiatric evaluation.   Final Clinical Impressions(s) / ED Diagnoses   Final diagnoses:  Paranoia (psychosis) [facility])    ED Discharge Orders    None       Raeford Razor, MD 12/03/18 2350

## 2018-12-03 NOTE — ED Notes (Signed)
Pt given turkey sandwich and sprite to drink 

## 2018-12-03 NOTE — ED Notes (Signed)
Pt resting quietly at this time.  Sprite given to pt.

## 2018-12-04 ENCOUNTER — Encounter (HOSPITAL_COMMUNITY): Payer: Self-pay | Admitting: Registered Nurse

## 2018-12-04 ENCOUNTER — Inpatient Hospital Stay (HOSPITAL_COMMUNITY)
Admission: AD | Admit: 2018-12-04 | Discharge: 2018-12-10 | DRG: 885 | Disposition: A | Discharge status: Home / Self Care | Payer: Medicare Other | Attending: Psychiatry | Admitting: Psychiatry

## 2018-12-04 DIAGNOSIS — I10 Essential (primary) hypertension: Secondary | ICD-10-CM | POA: Diagnosis present

## 2018-12-04 DIAGNOSIS — Z1159 Encounter for screening for other viral diseases: Secondary | ICD-10-CM

## 2018-12-04 DIAGNOSIS — F200 Paranoid schizophrenia: Principal | ICD-10-CM | POA: Diagnosis present

## 2018-12-04 DIAGNOSIS — F29 Unspecified psychosis not due to a substance or known physiological condition: Secondary | ICD-10-CM | POA: Diagnosis present

## 2018-12-04 DIAGNOSIS — F22 Delusional disorders: Secondary | ICD-10-CM | POA: Diagnosis present

## 2018-12-04 DIAGNOSIS — G4700 Insomnia, unspecified: Secondary | ICD-10-CM | POA: Diagnosis present

## 2018-12-04 LAB — LIPID PANEL
Cholesterol: 226 mg/dL — ABNORMAL HIGH (ref 0–200)
HDL: 51 mg/dL (ref >40)
LDL Cholesterol: 151 mg/dL — ABNORMAL HIGH (ref 0–99)
Total CHOL/HDL Ratio: 4.4 RATIO
Triglycerides: 118 mg/dL (ref <150)
VLDL: 24 mg/dL (ref 0–40)

## 2018-12-04 LAB — TSH: TSH: 0.632 u[IU]/mL (ref 0.350–4.500)

## 2018-12-04 LAB — SARS CORONAVIRUS 2 BY RT PCR (HOSPITAL ORDER, PERFORMED IN ~~LOC~~ HOSPITAL LAB): SARS Coronavirus 2: NEGATIVE

## 2018-12-04 MED ORDER — LORAZEPAM 2 MG/ML IJ SOLN
INTRAMUSCULAR | Status: AC
  Filled 2018-12-04: qty 1

## 2018-12-04 MED ORDER — BENZOCAINE 10 % MT GEL
Freq: Four times a day (QID) | OROMUCOSAL | Status: DC | PRN
  Filled 2018-12-04: qty 9

## 2018-12-04 MED ORDER — DIPHENHYDRAMINE HCL 50 MG/ML IJ SOLN
INTRAMUSCULAR | Status: AC
  Filled 2018-12-04: qty 1

## 2018-12-04 MED ORDER — IBUPROFEN 600 MG PO TABS
600.0000 mg | ORAL_TABLET | Freq: Four times a day (QID) | ORAL | Status: DC | PRN
  Administered 2018-12-06 – 2018-12-10 (×3): 600 mg via ORAL
  Filled 2018-12-04 (×3): qty 1

## 2018-12-04 MED ORDER — HYDROXYZINE HCL 25 MG PO TABS
25.0000 mg | ORAL_TABLET | Freq: Three times a day (TID) | ORAL | Status: DC | PRN
  Administered 2018-12-04 – 2018-12-07 (×4): 25 mg via ORAL
  Filled 2018-12-04 (×5): qty 1

## 2018-12-04 MED ORDER — OLANZAPINE 5 MG PO TBDP
5.0000 mg | ORAL_TABLET | Freq: Two times a day (BID) | ORAL | Status: DC
  Administered 2018-12-04 – 2018-12-06 (×4): 5 mg via ORAL
  Filled 2018-12-04 (×4): qty 1

## 2018-12-04 MED ORDER — LORAZEPAM 2 MG/ML IJ SOLN
2.0000 mg | Freq: Once | INTRAMUSCULAR | Status: AC
  Administered 2018-12-04: 2 mg via INTRAMUSCULAR

## 2018-12-04 MED ORDER — HALOPERIDOL LACTATE 5 MG/ML IJ SOLN
5.0000 mg | Freq: Once | INTRAMUSCULAR | Status: AC
  Administered 2018-12-04: 5 mg via INTRAMUSCULAR

## 2018-12-04 MED ORDER — HALOPERIDOL LACTATE 5 MG/ML IJ SOLN
INTRAMUSCULAR | Status: AC
  Filled 2018-12-04: qty 1

## 2018-12-04 MED ORDER — TRAZODONE HCL 50 MG PO TABS: 50.0000 mg | ORAL_TABLET | Freq: Every evening | ORAL | Status: DC | PRN

## 2018-12-04 MED ORDER — DIPHENHYDRAMINE HCL 50 MG/ML IJ SOLN
50.0000 mg | Freq: Once | INTRAMUSCULAR | Status: AC
  Administered 2018-12-04: 50 mg via INTRAMUSCULAR

## 2018-12-04 NOTE — ED Notes (Signed)
Clinician contacted pt's nurse, Monique RN, in an effort to complete pt's BH Assessment. Pt's nurse stated that pt is still soundly sleeping and unable to complete her assessment at this time. Will attempt again at a later time.

## 2018-12-04 NOTE — ED Notes (Signed)
Nira Conn, NP, determined pt meets inpatient criteria. There are currently no appropriate beds available for pt at [facility], so she will be placed on the observation unit at [facility]. Pt is to arrive at 0800.  Room: 203-1 Accepting: Nira Conn, NP Attending: Dr. Lucianne Muss Call to Report: [phone]  This information was provided to pt's nurse, Trish Mage, at [phone].

## 2018-12-04 NOTE — ED Notes (Signed)
Pts belongings and valuables given to [employer] for transport to [facility]

## 2018-12-04 NOTE — ED Notes (Signed)
Pt given [place] sandwich and something to drink.

## 2018-12-04 NOTE — BH Assessment (Signed)
[facility] Assessment Progress Note  Per Juanetta Beets, DO this pt requires psychiatric hospitalization at this time.  Pt presents under IVC initiated by EDP Raeford Razor, MD.  At this time [facility] does not have a suitable bed for this pt.  Pt's record shows diagnosis of "mental retardation," but chart does not contain psychometric testing to establish an IQ, which will be critical for placing pt.  At 13:55 this writer called the [facility] [phone]) to inquire as to whether pt has a care coordinator.  I was transferred to Aliene Altes who reports that he has had some involvement with pt's needs, but will have to check with colleagues and supervisors to ascertain what information he is permitted to share with me.  I provided him with my current phone number, my voice mail, and my fax number for response.  As of this writing response is pending.  Doylene Canning, [place] [facility] Coordinator [phone]

## 2018-12-04 NOTE — Progress Notes (Signed)
Pt has been increasing becoming agitated, screaming and banging on the doors. Pt has been spitting, kicking and pulling on staff hair. Pt has been redirected but has not been responding, instead becomes verbally aggressive towards staff. One time order of 2 mg Ativan, 50 mg of Benadryl, and 5 mg of Haldol IM given. Pt is in her room at this time quite, will continue to monitor.

## 2018-12-04 NOTE — Progress Notes (Signed)
Pt meets inpatient criteria per Nira Conn, NP. Referral information has been sent to the following hospitals for review:  [facility]  Disposition will continue to assist with pt's inpatient placement needs.   Wells Guiles, LCSW, LCAS Disposition CSW [facility] B[facility]/TTS [phone]

## 2018-12-04 NOTE — ED Notes (Signed)
Pt woke up and ambulated to restroom, asking to speak to a doctor. EKG done while pt still awake, copy given to Dr. Preston Fleeting. Pt states she is being stalked by "law offices of [place]" and "gorilla with the dreads". This RN reassured pt she is in a safe place, that she will be taken care of by staff. TTS now being performed at bedside.

## 2018-12-04 NOTE — BH Assessment (Addendum)
Tele Assessment Note   Patient Name: Wendy Berry MRN: [id number] Referring Physician: Dr. Raeford Razor, MD Location of Patient: [facility] Location of Provider: Behavioral Health TTS Department  Wendy Berry is a 33 y.o. female who was brought to [facility] by [employer] due to multiple calls they had gotten in regards to pt causing disruptions with her mother and then, after she left her mother's home, with neighbors. Due to pt's ongoing difficulties getting along with others and the trouble she was stirring up with others, [employer] determined it would be best to have her brought in to the hospital to have a psychiatric evaluation.  During the assessment, pt is talking a great deal about numerous people who are stalking her; she provides each person's full names and spells their last names. Pt is difficult to re-direct and to get to change subjects. Eventually, clinician is able to ask pt specific questions instead of open-ended questions. Pt denies SI past or present, attempts to kill herself, any prior hospitalizations for mental health reasons, HI, AVH, NSSIB, SA, and access to guns/weapons. Pt states she has an upcoming court date on February 16, 2019 for the stalking cases.  Pt declined to allow clinician to contact her mother, stating she doesn't want her mother involved. Pt also declined to allow clinician to contact anyone to ask questions about her.  Pt is oriented x4. Her recent and remote memory appear to be intact. Pt was cooperative, yet manic, throughout the assessment process. Pt's insight, judgement, and impulse control is poor at this time.   Diagnosis: F20.9, Schizophrenia   Past Medical History:  Past Medical History:  Diagnosis Date  . Cerebral aneurysm   . Heart murmur   . Mental retardation   . Paranoid schizophrenia [facility])     Past Surgical History:  Procedure Laterality Date  . BRAIN SURGERY      Family History: No family history on file.  Social History:   reports that she has never smoked. She has never used smokeless tobacco. She reports that she does not drink alcohol or use drugs.  Additional Social History:  Alcohol / Drug Use Pain Medications: Please see MAR Prescriptions: Please see MAR Over the Counter: Please see MAR History of alcohol / drug use?: No history of alcohol / drug abuse Longest period of sobriety (when/how long): Pt denies SA  CIWA: CIWA-Ar BP: (!) 134/93 Pulse Rate: 73 COWS:    Allergies: No Known Allergies  Home Medications: (Not in a hospital admission)   OB/GYN Status:  Patient's last menstrual period was 11/20/2018 (approximate).  General Assessment Data Assessment unable to be completed: Yes Reason for not completing assessment: Clinician contacted pt's nurse in an effort to complete pt's BH Assessment. Pt's nurse stated that pt is still soundly sleeping and unable to complete her assessment at this time. Will attempt again at a later time. Location of Assessment: [facility] ED TTS Assessment: In system Is this a Tele or Face-to-Face Assessment?: Tele Assessment Is this an Initial Assessment or a Re-assessment for this encounter?: Initial Assessment Patient Accompanied by:: N/A Language Other than English: No Living Arrangements: Homeless/Shelter What gender do you identify as?: Female Marital status: (UTA) Maiden name: UTA Pregnancy Status: Unable to assess Living Arrangements: Other (Comment)(Pt is homeless) Can pt return to current living arrangement?: Yes Admission Status: Involuntary Petitioner: Police Is patient capable of signing voluntary admission?: No Referral Source: MD Insurance type: Medicare     Crisis Care Plan Living Arrangements: Other (Comment)(Pt  is homeless) Legal Guardian: Other:(Self) Name of Psychiatrist: None Name of Therapist: None  Education Status Is patient currently in school?: No Is the patient employed, unemployed or receiving disability?: Receiving disability  income  Risk to self with the past 6 months Suicidal Ideation: (Pt denies) Has patient been a risk to self within the past 6 months prior to admission? : (Pt denies) Suicidal Intent: (Pt denies) Has patient had any suicidal intent within the past 6 months prior to admission? : (Pt denies) Is patient at risk for suicide?: (Pt denies) Suicidal Plan?: (Pt denies) Has patient had any suicidal plan within the past 6 months prior to admission? : (Pt denies) Access to Means: (UTA) What has been your use of drugs/alcohol within the last 12 months?: Pt denies Previous Attempts/Gestures: (Pt denies) How many times?: (UTA) Other Self Harm Risks: Pt is homeless, states she is not taking medication, is manic, is delusional Triggers for Past Attempts: Other (Comment)(UTA) Intentional Self Injurious Behavior: (Pt denies) Family Suicide History: Unable to assess Recent stressful life event(s): Other (Comment)(Pt is delusional and believes she is being stalked) Persecutory voices/beliefs?: Yes Depression: No Depression Symptoms: Feeling angry/irritable Substance abuse history and/or treatment for substance abuse?: No Suicide prevention information given to non-admitted patients: Not applicable  Risk to Others within the past 6 months Homicidal Ideation: (Pt denies) Does patient have any lifetime risk of violence toward others beyond the six months prior to admission? : (Pt denies) Thoughts of Harm to Others: (Pt denies) Current Homicidal Intent: (Pt denies) Current Homicidal Plan: (Pt denies) Access to Homicidal Means: (Pt denies) Identified Victim: Pt denies History of harm to others?: (Pt denies) Assessment of Violence: None Noted Violent Behavior Description: Pt denies Does patient have access to weapons?: (Pt denies) Criminal Charges Pending?: (Pt denies) Does patient have a court date: Yes(Reports court date of February 16, 2019 for those stalking her) Court Date: 02/16/19(For those stalking  her) Is patient on probation?: Unknown  Psychosis Hallucinations: None noted Delusions: Grandiose(Pt believes many people are stalking her)  Mental Status Report Appearance/Hygiene: Unremarkable Eye Contact: Good Motor Activity: Freedom of movement(Pt was sitting up in her hospital bed) Speech: Rapid, Pressured Level of Consciousness: Alert Mood: Pleasant Affect: Appropriate to circumstance Anxiety Level: Minimal Thought Processes: Flight of Ideas Judgement: Impaired Orientation: Unable to assess Obsessive Compulsive Thoughts/Behaviors: Severe  Cognitive Functioning Concentration: Poor Memory: Unable to Assess Is patient IDD: No Insight: Unable to Assess Impulse Control: Unable to Assess Appetite: (UTA) Have you had any weight changes? : (UTA) Sleep: Unable to Assess Total Hours of Sleep: (UTA) Vegetative Symptoms: Unable to Assess  ADLScreening [facility] Assessment Services) Patient's cognitive ability adequate to safely complete daily activities?: Yes Patient able to express need for assistance with ADLs?: Yes Independently performs ADLs?: Yes (appropriate for developmental age)  Prior Inpatient Therapy Prior Inpatient Therapy: (Pt denies)  Prior Outpatient Therapy Prior Outpatient Therapy: (Pt denies)  ADL Screening (condition at time of admission) Patient's cognitive ability adequate to safely complete daily activities?: Yes Is the patient deaf or have difficulty hearing?: No Does the patient have difficulty seeing, even when wearing glasses/contacts?: No Does the patient have difficulty concentrating, remembering, or making decisions?: Yes Patient able to express need for assistance with ADLs?: Yes Does the patient have difficulty dressing or bathing?: No Independently performs ADLs?: Yes (appropriate for developmental age) Does the patient have difficulty walking or climbing stairs?: No Weakness of Legs: None Weakness of Arms/Hands: None  Home Assistive  Devices/Equipment Home Assistive Devices/Equipment:  None  Therapy Consults (therapy consults require a physician order) PT Evaluation Needed: No OT Evalulation Needed: No SLP Evaluation Needed: No Abuse/Neglect Assessment (Assessment to be complete while patient is alone) Abuse/Neglect Assessment Can Be Completed: Unable to assess, patient is non-responsive or altered mental status Values / Beliefs Cultural Requests During Hospitalization: None Spiritual Requests During Hospitalization: None Consults Spiritual Care Consult Needed: No Social Work Consult Needed: No Merchant navy officer (For Healthcare) Does Patient Have a Medical Advance Directive?: Unable to assess, patient is non-responsive or altered mental status        Disposition: Nira Conn, NP, reviewed pt's chart and information and determined pt meets inpatient criteria. There are currently no appropriate beds available for pt at [facility]: [facility], so she will be placed on the observation unit at [facility]. Pt is to arrive at 0800.  Room: 203-1 Accepting: Nira Conn, NP Attending: Dr. Lucianne Muss Call to Report: [phone]   This information was provided to pt's nurse, Trish Mage, at [phone]. Disposition Initial Assessment Completed for this Encounter: Yes Patient referred to: Other (Comment)(Pt is being transferred to [facility] Observation Unit Bed 203-1)  This service was provided via telemedicine using a 2-way, interactive audio and video technology.  Names of all persons participating in this telemedicine service and their role in this encounter. Name: Wendy Berry Role: Patient  Name: Nira Conn Role: Nurse Practitioner  Name: Duard Brady Role: Clinician    Ralph Dowdy 12/04/2018 6:17 AM

## 2018-12-04 NOTE — BH Assessment (Signed)
Patient arrived to Observation, patient cooperative and pleasant at this time. Patient skin observed and intact. Patient skin search completed. Patient alert and oriented. Patient educated to unit and introduced to staff.

## 2018-12-04 NOTE — ED Notes (Signed)
Clinician contacted pt's nurse, Monique RN, in an effort to complete pt's BH Assessment. Pt's sitter attempted to wake pt for her assessment but pt said "no" and refused to wake for their assessment. It was agreed that clinician would attempt again at a later time.

## 2018-12-04 NOTE — ED Provider Notes (Signed)
ECG is not crossing over to MUSE. ECG shows normal sinus rhythm with a rate of 92, no ectopy. Normal axis. Normal P wave. Normal QRS. Normal intervals. Normal ST and T waves. Impression: normal ECG.  When compared with ECG of September 11, 2018, no significant changes are seen.    Dione Booze, MD 12/04/18 [phone]

## 2018-12-04 NOTE — H&P (Addendum)
BH Observation Unit Provider Admission PAA/H&P  Patient Identification: Wendy Berry MRN:  [id number] Date of Evaluation:  12/04/2018 Chief Complaint:  Schizophrenia Principal Diagnosis: Paranoid type delusional disorder (HCC) Diagnosis:  Principal Problem:   Paranoid type delusional disorder (HCC) Active Problems:   Psychotic disorder (HCC)  History of Present Illness: Per Tele Assessment Note reviewed by this provider: [name] is a 33 y.o. female who was brought to [facility] by [facility] due to multiple calls they had gotten in regards to pt causing disruptions with her mother and then, after she left her mother's home, with neighbors. Due to pt's ongoing difficulties getting along with others and the trouble she was stirring up with others, [employer] determined it would be best to have her brought in to the hospital to have a psychiatric evaluation.  During the assessment, pt is talking a great deal about numerous people who are stalking her; she provides each person's full names and spells their last names. Pt is difficult to re-direct and to get to change subjects. Eventually, clinician is able to ask pt specific questions instead of open-ended questions. Pt denies SI past or present, attempts to kill herself, any prior hospitalizations for mental health reasons, HI, AVH, NSSIB, SA, and access to guns/weapons. Pt states she has an upcoming court date on February 16, 2019 for the stalking cases.  Pt declined to allow clinician to contact her mother, stating she doesn't want her mother involved. Pt also declined to allow clinician to contact anyone to ask questions about her.  Pt is oriented x4. Her recent and remote memory appear to be intact. Pt was cooperative, yet manic, throughout the assessment process. Pt's insight, judgement, and impulse control is poor at this time.  Patient seen face to face by this provider, Dr. Sharma Covert, and chart reviewed on 12/04/18.  On evaluation patient has pressured  speech, disorganized, tangential, and not easily redirected.  Patient verbalizes on and on about someone stalking her. Patient named several people and when asked about where she lives or works she reported people there stalking her also.  Patient is not a good historian related to being fixated on someone stalking her and unable to give any other information.  Patient stated that she wants to go home and when she can leave.  Patient unable to give the name of anyone who could be contacted for collateral information stating she is being stalked and doesn't want anyone in her business.   Associated Signs/Symptoms: Depression Symptoms:  Patient denies depression.  Unable to obtain information at this time (Hypo) Manic Symptoms:  Delusions, Distractibility, Impulsivity, Irritable Mood, Labiality of Mood, Anxiety Symptoms:  Excessive Worry, Psychotic Symptoms:  Delusions, Paranoia, PTSD Symptoms: Unable to obtain information at this time Total Time spent with patient: 30 minutes  Past Psychiatric History: Prior ED visit with paranoid type delusions.  No other record found.  Patient unable to give more information other than stating she is not sick.   Is the patient at risk to self? No.  Has the patient been a risk to self in the past 6 months? No.  Has the patient been a risk to self within the distant past? No.  Is the patient a risk to others? No.  Has the patient been a risk to others in the past 6 months? No.  Has the patient been a risk to others within the distant past? No.   Prior Inpatient Therapy:   Unable to obtain Prior Outpatient Therapy:  Unable  to obtain  Alcohol Screening:  N/A Substance Abuse History in the last 12 months:  Unable to obtain information Consequences of Substance Abuse: Unaware Previous Psychotropic Medications: Yes  Psychological Evaluations: No  Past Medical History:  Past Medical History:  Diagnosis Date  . Cerebral aneurysm   . Heart murmur   .  Mental retardation   . Paranoid schizophrenia [facility])     Past Surgical History:  Procedure Laterality Date  . BRAIN SURGERY     Family History: History reviewed. No pertinent family history. Family Psychiatric History: None per chart review.   Tobacco Screening:  N/A Social History:  Social History   Substance and Sexual Activity  Alcohol Use No     Social History   Substance and Sexual Activity  Drug Use No    Additional Social History: N/A                           Allergies:  No Known Allergies Lab Results:  Results for orders placed or performed during the hospital encounter of 12/03/18 (from the past 48 hour(s))  Comprehensive metabolic panel     Status: Abnormal   Collection Time: 12/03/18  4:20 PM  Result Value Ref Range   Sodium 136 135 - 145 mmol/L   Potassium 3.8 3.5 - 5.1 mmol/L   Chloride 103 98 - 111 mmol/L   CO2 24 22 - 32 mmol/L   Glucose, Bld 103 (H) 70 - 99 mg/dL   BUN 17 6 - 20 mg/dL   Creatinine, Ser 2.72 (H) 0.44 - 1.00 mg/dL   Calcium 9.3 8.9 - 53.6 mg/dL   Total Protein 8.1 6.5 - 8.1 g/dL   Albumin 3.5 3.5 - 5.0 g/dL   AST 19 15 - 41 U/L   ALT 16 0 - 44 U/L   Alkaline Phosphatase 73 38 - 126 U/L   Total Bilirubin 0.7 0.3 - 1.2 mg/dL   GFR calc non Af Amer >60 >60 mL/min   GFR calc Af Amer >60 >60 mL/min   Anion gap 9 5 - 15    Comment: Performed at [facility], [address] [place], [place] [address]  Ethanol     Status: None   Collection Time: 12/03/18  4:20 PM  Result Value Ref Range   Alcohol, Ethyl (B) <10 <10 mg/dL    Comment: (NOTE) Lowest detectable limit for serum alcohol is 10 mg/dL. For medical purposes only. Performed at [facility], [address] [place], [place] [address]   CBC with Diff     Status: None   Collection Time: 12/03/18  4:20 PM  Result Value Ref Range   WBC 7.8 4.0 - 10.5 K/uL   RBC 4.52 3.87 - 5.11 MIL/uL   Hemoglobin 13.0 12.0 - 15.0 g/dL   HCT 95.6 38.7 - 56.4 %   MCV 88.1  80.0 - 100.0 fL   MCH 28.8 26.0 - 34.0 pg   MCHC 32.7 30.0 - 36.0 g/dL   RDW 33.2 95.1 - 88.4 %   Platelets 396 150 - 400 K/uL   nRBC 0.0 0.0 - 0.2 %   Neutrophils Relative % 63 %   Neutro Abs 4.9 1.7 - 7.7 K/uL   Lymphocytes Relative 27 %   Lymphs Abs 2.1 0.7 - 4.0 K/uL   Monocytes Relative 9 %   Monocytes Absolute 0.7 0.1 - 1.0 K/uL   Eosinophils Relative 1 %   Eosinophils  Absolute 0.1 0.0 - 0.5 K/uL   Basophils Relative 0 %   Basophils Absolute 0.0 0.0 - 0.1 K/uL   Immature Granulocytes 0 %   Abs Immature Granulocytes 0.02 0.00 - 0.07 K/uL    Comment: Performed at [facility], [address] [place], [place] [address]  I-Stat beta hCG blood, ED     Status: None   Collection Time: 12/03/18  4:23 PM  Result Value Ref Range   I-stat hCG, quantitative <5.0 <5 mIU/mL   Comment 3            Comment:   GEST. AGE      CONC.  (mIU/mL)   <=1 WEEK        5 - 50     2 WEEKS       50 - 500     3 WEEKS       100 - 10,000     4 WEEKS     1,000 - 30,000        FEMALE AND NON-PREGNANT FEMALE:     LESS THAN 5 mIU/mL   Urine rapid drug screen (hosp performed)     Status: None   Collection Time: 12/03/18  8:54 PM  Result Value Ref Range   Opiates NONE DETECTED NONE DETECTED   Cocaine NONE DETECTED NONE DETECTED   Benzodiazepines NONE DETECTED NONE DETECTED   Amphetamines NONE DETECTED NONE DETECTED   Tetrahydrocannabinol NONE DETECTED NONE DETECTED   Barbiturates NONE DETECTED NONE DETECTED    Comment: (NOTE) DRUG SCREEN FOR MEDICAL PURPOSES ONLY.  IF CONFIRMATION IS NEEDED FOR ANY PURPOSE, NOTIFY LAB WITHIN 5 DAYS. LOWEST DETECTABLE LIMITS FOR URINE DRUG SCREEN Drug Class                     Cutoff (ng/mL) Amphetamine and metabolites    1000 Barbiturate and metabolites    200 Benzodiazepine                 200 Tricyclics and metabolites     300 Opiates and metabolites        300 Cocaine and metabolites        300 THC                            50 Performed at [facility], [address] [place], [place] [address]   Urinalysis, Complete w Microscopic     Status: Abnormal   Collection Time: 12/03/18  8:54 PM  Result Value Ref Range   Color, Urine YELLOW YELLOW   APPearance HAZY (A) CLEAR   Specific Gravity, Urine 1.026 1.005 - 1.030   pH 5.0 5.0 - 8.0   Glucose, UA NEGATIVE NEGATIVE mg/dL   Hgb urine dipstick NEGATIVE NEGATIVE   Bilirubin Urine NEGATIVE NEGATIVE   Ketones, ur NEGATIVE NEGATIVE mg/dL   Protein, ur NEGATIVE NEGATIVE mg/dL   Nitrite NEGATIVE NEGATIVE   Leukocytes,Ua TRACE (A) NEGATIVE   RBC / HPF 0-5 0 - 5 RBC/hpf   WBC, UA 6-10 0 - 5 WBC/hpf   Bacteria, UA NONE SEEN NONE SEEN   Squamous Epithelial / LPF 6-10 0 - 5   Mucus PRESENT     Comment: Performed at [facility], [address] [place], [place] [address]  SARS Coronavirus 2 (CEPHEID - Performed in [facility] hospital lab), [facility] Order     Status: None   Collection  Time: 12/03/18 10:59 PM  Result Value Ref Range   SARS Coronavirus 2 NEGATIVE NEGATIVE    Comment: (NOTE) If result is NEGATIVE SARS-CoV-2 target nucleic acids are NOT DETECTED. The SARS-CoV-2 RNA is generally detectable in upper and lower  respiratory specimens during the acute phase of infection. The lowest  concentration of SARS-CoV-2 viral copies this assay can detect is 250  copies / mL. A negative result does not preclude SARS-CoV-2 infection  and should not be used as the sole basis for treatment or other  patient management decisions.  A negative result may occur with  improper specimen collection / handling, submission of specimen other  than nasopharyngeal swab, presence of viral mutation(s) within the  areas targeted by this assay, and inadequate number of viral copies  (<250 copies / mL). A negative result must be combined with clinical  observations, patient history, and epidemiological information. If result is POSITIVE SARS-CoV-2 target nucleic acids are DETECTED. The SARS-CoV-2  RNA is generally detectable in upper and lower  respiratory specimens dur ing the acute phase of infection.  Positive  results are indicative of active infection with SARS-CoV-2.  Clinical  correlation with patient history and other diagnostic information is  necessary to determine patient infection status.  Positive results do  not rule out bacterial infection or co-infection with other viruses. If result is PRESUMPTIVE POSTIVE SARS-CoV-2 nucleic acids MAY BE PRESENT.   A presumptive positive result was obtained on the submitted specimen  and confirmed on repeat testing.  While 2019 novel coronavirus  (SARS-CoV-2) nucleic acids may be present in the submitted sample  additional confirmatory testing may be necessary for epidemiological  and / or clinical management purposes  to differentiate between  SARS-CoV-2 and other Sarbecovirus currently known to infect humans.  If clinically indicated additional testing with an alternate test  methodology [phone]) is advised. The SARS-CoV-2 RNA is generally  detectable in upper and lower respiratory sp ecimens during the acute  phase of infection. The expected result is Negative. Fact Sheet for Patients:  BoilerBrush.com.cy Fact Sheet for Healthcare Providers: https://pope.com/ This test is not yet approved or cleared by the [place] FDA and has been authorized for detection and/or diagnosis of SARS-CoV-2 by FDA under an Emergency Use Authorization (EUA).  This EUA will remain in effect (meaning this test can be used) for the duration of the COVID-19 declaration under Section 564(b)(1) of the Act, 21 U.S.C. section 360bbb-3(b)(1), unless the authorization is terminated or revoked sooner. Performed at [facility], [address], [place], [place] [address]     Blood Alcohol level:  Lab Results  Component Value Date   ETH <10 12/03/2018   ETH <10 05/09/2017    Metabolic  Disorder Labs:  No results found for: HGBA1C, MPG No results found for: PROLACTIN No results found for: CHOL, TRIG, HDL, CHOLHDL, VLDL, LDLCALC  Current Medications: No current facility-administered medications for this encounter.    PTA Medications: Medications Prior to Admission  Medication Sig Dispense Refill Last Dose  . amantadine (SYMMETREL) 100 MG capsule Take 1 capsule (100 mg total) by mouth 2 (two) times daily. (Patient not taking: Reported on 06/14/2017) 60 capsule 0 Not Taking at Unknown time  . hydrOXYzine (ATARAX/VISTARIL) 25 MG tablet Take 1 tablet (25 mg total) by mouth every 6 (six) hours as needed for anxiety. (Patient not taking: Reported on 07/01/2018) 20 tablet 0 Not Taking at Unknown time  . levocetirizine (XYZAL) 5 MG tablet Take 1 tablet (5  mg total) by mouth every evening. (Patient not taking: Reported on 05/09/2017) 30 tablet 0 Not Taking at Unknown time  . meloxicam (MOBIC) 7.5 MG tablet Take 1 tablet (7.5 mg total) by mouth daily. AS NEEDED FOR HEADACHE. (Patient not taking: Reported on 07/01/2018) 20 tablet 0 Not Taking at Unknown time  . Oxcarbazepine (TRILEPTAL) 300 MG tablet Take 1 tablet (300 mg total) by mouth 2 (two) times daily. (Patient not taking: Reported on 05/09/2017) 60 tablet 0 Not Taking at Unknown time  . paliperidone (INVEGA) 3 MG 24 hr tablet Take 1 tablet (3 mg total) by mouth daily. (Patient not taking: Reported on 05/09/2017) 30 tablet 0 Not Taking at Unknown time  . topiramate (TOPAMAX) 50 MG tablet Take 1 tablet (50 mg total) by mouth 2 (two) times daily. (Patient not taking: Reported on 07/01/2018) 30 tablet 0 Not Taking at Unknown time  . triamterene-hydrochlorothiazide (MAXZIDE-25) 37.5-25 MG tablet Take 1 tablet by mouth daily. (Patient not taking: Reported on 05/09/2017) 30 tablet 11 Not Taking at Unknown time    Musculoskeletal: Strength & Muscle Tone: within normal limits Gait & Station: normal Patient leans: N/A  Psychiatric Specialty  Exam: Physical Exam  Constitutional: She appears well-developed and well-nourished.  Neck: Normal range of motion.  Respiratory: Effort normal.  Musculoskeletal: Normal range of motion.  Neurological: She is alert.  Skin: Skin is warm and dry.  Psychiatric: Her mood appears anxious. Her affect is labile. Her speech is tangential. Thought content is paranoid. She expresses impulsivity. She exhibits abnormal recent memory.    Review of Systems  Psychiatric/Behavioral: Depression: Denies. Hallucinations: Denies. Substance abuse: Denies. Suicidal ideas: Denies. The patient is nervous/anxious.     Last menstrual period 11/20/2018.There is no height or weight on file to calculate BMI.  General Appearance: Casual  Eye Contact:  Good  Speech:  Pressured  Volume:  Increased  Mood:  Anxious and Irritable  Affect:  Labile  Thought Process:  Disorganized  Orientation:  Full (Time, Place, and Person)  Thought Content:  Delusions, Paranoid Ideation and Rumination  Suicidal Thoughts:  No  Homicidal Thoughts:  No  Memory:  Immediate;   Poor Recent;   Poor Remote;   Poor  Judgement:  Impaired  Insight:  Lacking  Psychomotor Activity:  Restlessness  Concentration:  Concentration: Poor  Recall:  Poor  Fund of Knowledge:  Fair  Language:  Fair  Akathisia:  No  Handed:  Right  AIMS (if indicated):   N/A  Assets:  Desire for Improvement Housing Social Support  ADL's:  Intact  Cognition:  Impaired,  Mild  Sleep:   N/A   Treatment Plan Summary: Daily contact with patient to assess and evaluate symptoms and progress in treatment, Medication management and Plan Inpatient psychiatric treatment recommended  Observation Level/Precautions:  15 minute checks Laboratory: Reviewed CBC Chemistry Profile UDS UA trac leuk, hazy; ordered TSH, Lipid profile   Psychotherapy:  As appropriate Medications:  Started Zyprexa zydis 5 mg Bid, Trazodone 50 mg Q hs prn, vistaril 25 mg Tid prn,   Consultations:  As appropriate Discharge Concerns:  Safety, stabilization, and access to medication Estimated LOS: Patient recommended for inpatient psychiatric treatment; patient will remain in observation until psychiatric inpatient bed available or until patient is stabilized.  Other:      Assunta Found, NP 6/2/20203:11 PM    Patient seen face-to-face for psychiatric evaluation, chart reviewed and case discussed with the physician extender and developed treatment plan. Reviewed the information documented and agree  with the treatment plan.  Juanetta Beets, DO 12/04/18 5:48 PM

## 2018-12-04 NOTE — ED Notes (Signed)
IV[facility] paperwork given to [employer] for pt transport

## 2018-12-04 NOTE — ED Notes (Signed)
Pt resting quietly at this time watching television.  No complaints voiced. 

## 2018-12-05 ENCOUNTER — Ambulatory Visit: Payer: Medicare Other | Admitting: Emergency Medicine

## 2018-12-05 DIAGNOSIS — F22 Delusional disorders: Secondary | ICD-10-CM | POA: Diagnosis not present

## 2018-12-05 MED ORDER — OLANZAPINE 10 MG PO TABS
10.0000 mg | ORAL_TABLET | Freq: Once | ORAL | Status: AC
  Administered 2018-12-05: 10 mg via ORAL
  Filled 2018-12-05: qty 1

## 2018-12-05 MED ORDER — LORAZEPAM 1 MG PO TABS
1.0000 mg | ORAL_TABLET | Freq: Once | ORAL | Status: AC
  Administered 2018-12-05: 1 mg via ORAL
  Filled 2018-12-05: qty 1

## 2018-12-05 MED ORDER — TRAZODONE HCL 100 MG PO TABS
100.0000 mg | ORAL_TABLET | Freq: Every evening | ORAL | Status: DC | PRN
  Administered 2018-12-05: 100 mg via ORAL
  Filled 2018-12-05 (×2): qty 1

## 2018-12-05 NOTE — Progress Notes (Signed)
Patient ID: Wendy Berry, female   DOB: [birth year], 33 y.o.   MRN: [id number]  Patient has become agitated off and on throughout the day. She was given Vistaril once with minimal benefit. She becomes particularly agitated when talking on the phone to her friends/relatives. She slept this am and has been up for lunch and dinner sitting in the day room. No incidents of aggression against staff. Some vebal aggression. NP aware.

## 2018-12-05 NOTE — Progress Notes (Signed)
Patient ID: [name], female   DOB: 04/01/1986, 33 y.o.   MRN: [id number]  [place] NOVEL CORONAVIRUS (COVID-19) DAILY CHECK-OFF SYMPTOMS - answer yes or no to each - every day NO YES  Have you had a fever in the past 24 hours?  . Fever (Temp > 37.80C / 100F) X   Have you had any of these symptoms in the past 24 hours? . New Cough .  Sore Throat  .  Shortness of Breath .  Difficulty Breathing .  Unexplained Body Aches   X   Have you had any one of these symptoms in the past 24 hours not related to allergies?   . Runny Nose .  Nasal Congestion .  Sneezing   X   If you have had runny nose, nasal congestion, sneezing in the past 24 hours, has it worsened?  X   EXPOSURES - check yes or no X   Have you traveled outside the state in the past 14 days?  X   Have you been in contact with someone with a confirmed diagnosis of COVID-19 or PUI in the past 14 days without wearing appropriate PPE?  X   Have you been living in the same home as a person with confirmed diagnosis of COVID-19 or a PUI (household contact)?    X   Have you been diagnosed with COVID-19?    X              What to do next: Answered NO to all: Answered YES to anything:   Proceed with unit schedule Follow the [facility] Inpatient Flowsheet.   

## 2018-12-05 NOTE — Progress Notes (Signed)
D:  Jumana was noted pacing the floors much of the evening.  She had to be redirected multiple times to return to her room to put on pants.  She continued to talk loud, demanding food and drink.  She denied SI/HI or A/V hallucinations but she is clearly responding to internal stimuli.  She took her hs medication without difficulty however it was not effective.  New one time orders for Zyprexa and Ativan given.  She is currently laying in her bed talking loudly.   A:  1:1 with RN for support and encouragement.  Medications as ordered.  Q 15 minute checks maintained for safety.   R:  Shinita remains safe on the unit.  We will continue to monitor the progress towards her goals.

## 2018-12-05 NOTE — Progress Notes (Addendum)
[facility] Observation Unit Provider Progress Note  Patient Identification: Wendy Berry MRN:  [id number] Date of Evaluation:  12/05/2018 Chief Complaint:  Schizophrenia Principal Diagnosis: Paranoid type delusional disorder (HCC) Diagnosis:  Principal Problem:   Paranoid type delusional disorder (HCC) Active Problems:   Psychotic disorder (HCC)  Subjective:   I just want to know when I can go home.  People harassing me and stalking me here a few times.  I don't want to get between anybody; don't want no body stalking me or messing with me.  Just want to live my life."  Patient seen face to face by this provider, Dr. Sharma Covert, and chart review on 12/05/18.  On evaluation continues to feel that someone is stalking her; pressured speech, disorganized.  Patient did appear somewhat calmer today; able to redirect somewhat easier than the day before.  Reported that she was eating/sleeping without difficulty.  Patient denies suicidal/self-harm/homicidal ideation, psychosis, and paranoia.  Patient has had several behavioral outbursts while in observation and continues to need inpatient psychiatric treatment.    Associated Signs/Symptoms: Depression Symptoms:  Patient denies depression.  Unable to obtain information at this time (Hypo) Manic Symptoms:  Delusions, Distractibility, Impulsivity, Irritable Mood, Labiality of Mood, Anxiety Symptoms:  Excessive Worry, Psychotic Symptoms:  Delusions, Paranoia, PTSD Symptoms: Unable to obtain information at this time Total Time spent with patient: 30 minutes  Past Psychiatric History: Prior ED visit with paranoid type delusions.  No other record found.  Patient unable to give more information other than stating she is not sick.   Is the patient at risk to self? No.  Has the patient been a risk to self in the past 6 months? No.  Has the patient been a risk to self within the distant past? No.  Is the patient a risk to others? No.  Has the patient been a risk  to others in the past 6 months? No.  Has the patient been a risk to others within the distant past? No.   Prior Inpatient Therapy:   Unable to obtain Prior Outpatient Therapy:  Unable to obtain  Alcohol Screening:  N/A Substance Abuse History in the last 12 months:  Unable to obtain information Consequences of Substance Abuse: Unaware Previous Psychotropic Medications: Yes  Psychological Evaluations: No  Past Medical History:  Past Medical History:  Diagnosis Date  . Cerebral aneurysm   . Heart murmur   . Paranoid schizophrenia [facility])     Past Surgical History:  Procedure Laterality Date  . BRAIN SURGERY     Family History: History reviewed. No pertinent family history. Family Psychiatric History: None per chart review.   Tobacco Screening:  N/A Social History:  Social History   Substance and Sexual Activity  Alcohol Use No     Social History   Substance and Sexual Activity  Drug Use No    Additional Social History: N/A  Allergies:  No Known Allergies Lab Results:  Results for orders placed or performed during the hospital encounter of 12/04/18 (from the past 48 hour(s))  Lipid panel     Status: Abnormal   Collection Time: 12/04/18  6:41 PM  Result Value Ref Range   Cholesterol 226 (H) 0 - 200 mg/dL   Triglycerides 825 <003 mg/dL   HDL 51 >70 mg/dL   Total CHOL/HDL Ratio 4.4 RATIO   VLDL 24 0 - 40 mg/dL   LDL Cholesterol 488 (H) 0 - 99 mg/dL    Comment:  Total Cholesterol/HDL:CHD Risk Coronary Heart Disease Risk Table                     Men   Women  1/2 Average Risk   3.4   3.3  Average Risk       5.0   4.4  2 X Average Risk   9.6   7.1  3 X Average Risk  23.4   11.0        Use the calculated Patient Ratio above and the CHD Risk Table to determine the patient's CHD Risk.        ATP III CLASSIFICATION (LDL):  <100     mg/dL   Optimal  [id number]  mg/dL   Near or Above                    Optimal  130-159  mg/dL   Borderline  [id number]  mg/dL    High  >956>190     mg/dL   Very High Performed at [facility], [address] [place], [place] [address]   TSH     Status: None   Collection Time: 12/04/18  6:41 PM  Result Value Ref Range   TSH 0.632 0.350 - 4.500 uIU/mL    Comment: Performed by a 3rd Generation assay with a functional sensitivity of <=0.01 uIU/mL. Performed at [facility], [address] [place], [place] [address]     Blood Alcohol level:  Lab Results  Component Value Date   ETH <10 12/03/2018   ETH <10 05/09/2017    Metabolic Disorder Labs:  No results found for: HGBA1C, MPG No results found for: PROLACTIN Lab Results  Component Value Date   CHOL 226 (H) 12/04/2018   TRIG 118 12/04/2018   HDL 51 12/04/2018   CHOLHDL 4.4 12/04/2018   VLDL 24 12/04/2018   LDLCALC 151 (H) 12/04/2018    Current Medications: Current Facility-Administered Medications  Medication Dose Route Frequency Provider Last Rate Last Dose  . benzocaine (ORAJEL) 10 % mucosal gel   Mouth/Throat QID PRN Denzil Magnusonhomas, Lashunda, NP      . hydrOXYzine (ATARAX/VISTARIL) tablet 25 mg  25 mg Oral TID PRN Rankin, Shuvon B, NP   25 mg at 12/04/18 1632  . ibuprofen (ADVIL) tablet 600 mg  600 mg Oral Q6H PRN Denzil Magnusonhomas, Lashunda, NP      . OLANZapine zydis (ZYPREXA) disintegrating tablet 5 mg  5 mg Oral BID Rankin, Shuvon B, NP   5 mg at 12/05/18 1133  . traZODone (DESYREL) tablet 50 mg  50 mg Oral QHS PRN Rankin, Shuvon B, NP       PTA Medications: Medications Prior to Admission  Medication Sig Dispense Refill Last Dose  . amantadine (SYMMETREL) 100 MG capsule Take 1 capsule (100 mg total) by mouth 2 (two) times daily. (Patient not taking: Reported on 06/14/2017) 60 capsule 0 Not Taking at Unknown time  . hydrOXYzine (ATARAX/VISTARIL) 25 MG tablet Take 1 tablet (25 mg total) by mouth every 6 (six) hours as needed for anxiety. (Patient not taking: Reported on 07/01/2018) 20 tablet 0 Not Taking at Unknown time  .  levocetirizine (XYZAL) 5 MG tablet Take 1 tablet (5 mg total) by mouth every evening. (Patient not taking: Reported on 05/09/2017) 30 tablet 0 Not Taking at Unknown time  . meloxicam (MOBIC) 7.5 MG tablet Take 1 tablet (7.5 mg total) by mouth daily. AS NEEDED FOR HEADACHE. (Patient not taking: Reported on 07/01/2018)  20 tablet 0 Not Taking at Unknown time  . Oxcarbazepine (TRILEPTAL) 300 MG tablet Take 1 tablet (300 mg total) by mouth 2 (two) times daily. (Patient not taking: Reported on 05/09/2017) 60 tablet 0 Not Taking at Unknown time  . paliperidone (INVEGA) 3 MG 24 hr tablet Take 1 tablet (3 mg total) by mouth daily. (Patient not taking: Reported on 05/09/2017) 30 tablet 0 Not Taking at Unknown time  . topiramate (TOPAMAX) 50 MG tablet Take 1 tablet (50 mg total) by mouth 2 (two) times daily. (Patient not taking: Reported on 07/01/2018) 30 tablet 0 Not Taking at Unknown time  . triamterene-hydrochlorothiazide (MAXZIDE-25) 37.5-25 MG tablet Take 1 tablet by mouth daily. (Patient not taking: Reported on 05/09/2017) 30 tablet 11 Not Taking at Unknown time    Musculoskeletal: Strength & Muscle Tone: within normal limits Gait & Station: normal Patient leans: N/A  Psychiatric Specialty Exam: Physical Exam  Constitutional: She appears well-developed and well-nourished.  Neck: Normal range of motion.  Respiratory: Effort normal.  Musculoskeletal: Normal range of motion.  Neurological: She is alert.  Skin: Skin is warm and dry.  Psychiatric: Her mood appears anxious. Her affect is labile. Her speech is tangential. Thought content is paranoid. She expresses impulsivity. She exhibits abnormal recent memory.    Review of Systems  Psychiatric/Behavioral: Depression: Denies. Hallucinations: Denies. Substance abuse: Denies. Suicidal ideas: Denies. The patient is nervous/anxious.     Last menstrual period 11/20/2018.There is no height or weight on file to calculate BMI.  General Appearance: Casual  Eye  Contact:  Good  Speech:  Pressured  Volume:  Increased  Mood:  Anxious and Irritable  Affect:  Labile  Thought Process:  Disorganized  Orientation:  Full (Time, Place, and Person)  Thought Content:  Delusions, Paranoid Ideation and Rumination  Suicidal Thoughts:  No  Homicidal Thoughts:  No  Memory:  Immediate;   Poor Recent;   Poor Remote;   Poor  Judgement:  Impaired  Insight:  Lacking  Psychomotor Activity:  Restlessness  Concentration:  Concentration: Poor  Recall:  Poor  Fund of Knowledge:  Fair  Language:  Fair  Akathisia:  No  Handed:  Right  AIMS (if indicated):   N/A  Assets:  Desire for Improvement Housing Social Support  ADL's:  Intact  Cognition:  Impaired,  Mild  Sleep:   N/A   Treatment Plan Summary: Daily contact with patient to assess and evaluate symptoms and progress in treatment, Medication management and Plan Inpatient psychiatric treatment recommended  Medication Management:  Continue Zyprexa zydis 5 mg Bid mood stabilization and psychosis Trazodone 50 mg Q hs prn insomnia, Vistaril 25 mg Tid prn anxiety    Continue current treatment plan.  Continue to look for psychiatric inpatient bed for treatment and stabilization.  Assunta Found, NP 6/3/20201:29 PM   Patient seen face-to-face for psychiatric evaluation, chart reviewed and case discussed with the physician extender and developed treatment plan. Reviewed the information documented and agree with the treatment plan.  Juanetta Beets, DO 12/05/18 2:15 PM

## 2018-12-06 ENCOUNTER — Other Ambulatory Visit: Payer: Self-pay

## 2018-12-06 DIAGNOSIS — G4700 Insomnia, unspecified: Secondary | ICD-10-CM | POA: Diagnosis present

## 2018-12-06 DIAGNOSIS — I10 Essential (primary) hypertension: Secondary | ICD-10-CM | POA: Diagnosis present

## 2018-12-06 DIAGNOSIS — F22 Delusional disorders: Secondary | ICD-10-CM | POA: Diagnosis not present

## 2018-12-06 DIAGNOSIS — F200 Paranoid schizophrenia: Secondary | ICD-10-CM | POA: Diagnosis present

## 2018-12-06 DIAGNOSIS — Z1159 Encounter for screening for other viral diseases: Secondary | ICD-10-CM | POA: Diagnosis not present

## 2018-12-06 MED ORDER — HALOPERIDOL LACTATE 5 MG/ML IJ SOLN: 5.0000 mg | Freq: Once | INTRAMUSCULAR | Status: AC

## 2018-12-06 MED ORDER — HALOPERIDOL 5 MG PO TABS
5.0000 mg | ORAL_TABLET | Freq: Four times a day (QID) | ORAL | Status: DC | PRN
  Administered 2018-12-07 (×2): 5 mg via ORAL
  Filled 2018-12-06 (×2): qty 1

## 2018-12-06 MED ORDER — LORAZEPAM 1 MG PO TABS
2.0000 mg | ORAL_TABLET | Freq: Four times a day (QID) | ORAL | Status: DC | PRN
  Administered 2018-12-07 (×2): 2 mg via ORAL
  Filled 2018-12-06 (×3): qty 2

## 2018-12-06 MED ORDER — OLANZAPINE 5 MG PO TBDP
5.0000 mg | ORAL_TABLET | Freq: Every day | ORAL | Status: DC
  Administered 2018-12-07: 5 mg via ORAL
  Filled 2018-12-06 (×3): qty 1

## 2018-12-06 MED ORDER — DIPHENHYDRAMINE HCL 50 MG/ML IJ SOLN: 50.0000 mg | Freq: Once | INTRAMUSCULAR | Status: AC

## 2018-12-06 MED ORDER — LORAZEPAM 2 MG/ML IJ SOLN
2.0000 mg | Freq: Once | INTRAMUSCULAR | Status: AC | PRN
  Administered 2018-12-06: 2 mg via INTRAVENOUS
  Filled 2018-12-06: qty 1

## 2018-12-06 MED ORDER — ZIPRASIDONE MESYLATE 20 MG IM SOLR
20.0000 mg | Freq: Two times a day (BID) | INTRAMUSCULAR | Status: DC | PRN
  Administered 2018-12-07 – 2018-12-08 (×2): 20 mg via INTRAMUSCULAR
  Filled 2018-12-06 (×2): qty 20

## 2018-12-06 MED ORDER — OLANZAPINE 10 MG PO TBDP
10.0000 mg | ORAL_TABLET | Freq: Every day | ORAL | Status: DC
  Administered 2018-12-06: 10 mg via ORAL
  Filled 2018-12-06 (×3): qty 1

## 2018-12-06 MED ORDER — LORAZEPAM 2 MG/ML IJ SOLN: 2.0000 mg | Freq: Four times a day (QID) | INTRAMUSCULAR | Status: DC | PRN

## 2018-12-06 MED ORDER — OLANZAPINE 10 MG PO TABS: 10.0000 mg | ORAL_TABLET | Freq: Every day | ORAL | Status: DC

## 2018-12-06 MED ORDER — DIPHENHYDRAMINE HCL 50 MG/ML IJ SOLN
50.0000 mg | Freq: Once | INTRAMUSCULAR | Status: AC | PRN
  Administered 2018-12-06: 50 mg via INTRAVENOUS
  Filled 2018-12-06: qty 1

## 2018-12-06 MED ORDER — TRIAMTERENE-HCTZ 37.5-25 MG PO TABS
1.0000 | ORAL_TABLET | Freq: Every day | ORAL | Status: DC
  Administered 2018-12-07 – 2018-12-10 (×4): 1 via ORAL
  Filled 2018-12-06 (×7): qty 1

## 2018-12-06 MED ORDER — DIPHENHYDRAMINE HCL 25 MG PO CAPS
50.0000 mg | ORAL_CAPSULE | Freq: Four times a day (QID) | ORAL | Status: DC | PRN
  Administered 2018-12-07 (×2): 50 mg via ORAL
  Filled 2018-12-06 (×3): qty 2

## 2018-12-06 MED ORDER — HALOPERIDOL LACTATE 5 MG/ML IJ SOLN
5.0000 mg | Freq: Once | INTRAMUSCULAR | Status: AC | PRN
  Administered 2018-12-06: 10:00:00 5 mg via INTRAMUSCULAR
  Filled 2018-12-06: qty 1

## 2018-12-06 MED ORDER — HALOPERIDOL LACTATE 5 MG/ML IJ SOLN: 5.0000 mg | Freq: Four times a day (QID) | INTRAMUSCULAR | Status: DC | PRN

## 2018-12-06 MED ORDER — DIPHENHYDRAMINE HCL 50 MG/ML IJ SOLN: 50.0000 mg | Freq: Four times a day (QID) | INTRAMUSCULAR | Status: DC | PRN

## 2018-12-06 NOTE — Progress Notes (Signed)
[place] NOVEL CORONAVIRUS (COVID-19) DAILY CHECK-OFF SYMPTOMS - answer yes or no to each - every day NO YES  Have you had a fever in the past 24 hours?  . Fever (Temp > 37.80C / 100F) X   Have you had any of these symptoms in the past 24 hours? . New Cough .  Sore Throat  .  Shortness of Breath .  Difficulty Breathing .  Unexplained Body Aches   X   Have you had any one of these symptoms in the past 24 hours not related to allergies?   . Runny Nose .  Nasal Congestion .  Sneezing   X   If you have had runny nose, nasal congestion, sneezing in the past 24 hours, has it worsened?  X   EXPOSURES - check yes or no X   Have you traveled outside the state in the past 14 days?  X   Have you been in contact with someone with a confirmed diagnosis of COVID-19 or PUI in the past 14 days without wearing appropriate PPE?  X   Have you been living in the same home as a person with confirmed diagnosis of COVID-19 or a PUI (household contact)?    X   Have you been diagnosed with COVID-19?    X              What to do next: Answered NO to all: Answered YES to anything:   Proceed with unit schedule Follow the [facility] Inpatient Flowsheet.   

## 2018-12-06 NOTE — Progress Notes (Signed)
D: Pt is sitting on the floor of the seclusion room awake. A: Pt is placed on 1:1 with sitter at their side. R: Pt remains safe on unit at this time. Will continue to monitor.
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
## 2018-12-06 NOTE — Progress Notes (Signed)
Psychoeducational Group Note  Date:  12/06/2018 Time:  2037  Group Topic/Focus:  Wrap-Up Group:   The focus of this group is to help patients review their daily goal of treatment and discuss progress on daily workbooks.  Participation Level: Did Not Attend  Participation Quality:  Not Applicable  Affect:  Not Applicable  Cognitive:  Not Applicable  Insight:  Not Applicable  Engagement in Group: Not Applicable  Additional Comments:  The patient did not attend group.   Hazle Coca S 12/06/2018, 8:37 PM

## 2018-12-06 NOTE — Progress Notes (Addendum)
Nursing 1:1 Note  D: Pt just woke up; snacks and fluids offered and accepted. Pt is cooperative however presents with labile mood. Pt was disorganized in thought process. Denies SI/HI/AVH at this time. Pt took scheduled Zyprexa. Requested PRN for pain of abdomen which was given to her.  A: 1:1 observation continues for safety. Sitter within arms reach.  R: Pt remains safe.

## 2018-12-06 NOTE — Progress Notes (Signed)
1:1 DAR Note: Pt transferred to [facility] 500-1 accompanied by staff and [employer] officer due to IVC status. Presents irritable on initial approach. [place] but cooperative on interactions. Denies SI, HI, AVH and pain. Asleep at this time, respirations noted and unlabored.  Support and reassurance offered to pt as needed throughout this shift. Encouraged pt to voice concerns, attend groups and comply with current treatment regimen. Assigned 1:1 staff in attendance at all times. Pt has not exhibited self harm gestures or outburst thus far.  POC maintained for safety and mood stability.

## 2018-12-06 NOTE — Progress Notes (Addendum)
Patient ID: Wendy Berry, female   DOB: 1986-06-17, 33 y.o.   MRN: [id number] [facility] Observation Unit Provider Progress Note  Patient Identification: Wendy Berry MRN:  [id number] Date of Evaluation:  12/06/2018 Chief Complaint:  Schizophrenia Principal Diagnosis: Paranoid type delusional disorder (HCC) Diagnosis:  Principal Problem:   Paranoid type delusional disorder (HCC) Active Problems:   Psychotic disorder (HCC)  Subjective:  Patient states that she wants to go home.  "I'm tired of this place and this shit."  Patient reports that she is eating/sleeping without difficulty; tolerating medications without adverse reaction.  Patient denies suicidal/self-harm/homicidal ideation, and psychosis.  When asked if she felt that someone was trying to harm her, watching her, or trying to kill her patient began to tell about all of the people that were stalking her; one at Wendy Berry, another at her apartment, and multiple others.   Patient seen face to face by this provider, Dr. Sharma Covert, and chart review on 12/06/18.  On evaluation patient continues standing in door way asking when can she go home.  Patient gets loud when talking about others getting in her business "You stay out of mine and [employer] stay out of yours.  Patient was heard talking to herself in her room when alone.  Patient continues to be intrusive, continues to have paranoid delusions about multiple people stalking her.  Nursing staff reports that patient has also been yelling at other patients stating that they were stalking her, looking at her, and getting into her business.  Patient also calling the other patients names such as cracker.  Nursing staff has concerns that other patient may try to hurt Wendy Berry if she continues to yell and curse at them.  Will continue to look for inpatient psychiatric treatment.       Associated Signs/Symptoms: Depression Symptoms:  psychomotor agitation, (Hypo) Manic Symptoms:   Delusions, Distractibility, Impulsivity, Irritable Mood, Labiality of Mood, Anxiety Symptoms:  Excessive Worry, Psychotic Symptoms:  Delusions, Paranoia, PTSD Symptoms: Unable to obtain information at this time Total Time spent with patient: 30 minutes  Past Psychiatric History: Prior ED visit with paranoid type delusions.  No other record found.  Patient unable to give more information other than stating she is not sick.   Is the patient at risk to self? No.  Has the patient been a risk to self in the past 6 months? No.  Has the patient been a risk to self within the distant past? No.  Is the patient a risk to others? No.  Has the patient been a risk to others in the past 6 months? No.  Has the patient been a risk to others within the distant past? No.   Prior Inpatient Therapy:   Unable to obtain Prior Outpatient Therapy:  Unable to obtain  Alcohol Screening:  N/A Substance Abuse History in the last 12 months:  Unable to obtain information Consequences of Substance Abuse: NA Previous Psychotropic Medications: Yes  Psychological Evaluations: No  Past Medical History:  Past Medical History:  Diagnosis Date  . Cerebral aneurysm   . Heart murmur   . Paranoid schizophrenia [facility])     Past Surgical History:  Procedure Laterality Date  . BRAIN SURGERY     Family History: History reviewed. No pertinent family history. Family Psychiatric History: None per chart review.   Tobacco Screening:  N/A Social History:  Social History   Substance and Sexual Activity  Alcohol Use No     Social History   Substance and  Sexual Activity  Drug Use No    Additional Social History: N/A  Allergies:  No Known Allergies Lab Results:  Results for orders placed or performed during the hospital encounter of 12/04/18 (from the past 48 hour(s))  Lipid panel     Status: Abnormal   Collection Time: 12/04/18  6:41 PM  Result Value Ref Range   Cholesterol 226 (H) 0 - 200 mg/dL    Triglycerides 846 <962 mg/dL   HDL 51 >95 mg/dL   Total CHOL/HDL Ratio 4.4 RATIO   VLDL 24 0 - 40 mg/dL   LDL Cholesterol 284 (H) 0 - 99 mg/dL    Comment:        Total Cholesterol/HDL:CHD Risk Coronary Heart Disease Risk Table                     Men   Women  1/2 Average Risk   3.4   3.3  Average Risk       5.0   4.4  2 X Average Risk   9.6   7.1  3 X Average Risk  23.4   11.0        Use the calculated Patient Ratio above and the CHD Risk Table to determine the patient's CHD Risk.        ATP III CLASSIFICATION (LDL):  <100     mg/dL   Optimal  132-440  mg/dL   Near or Above                    Optimal  130-159  mg/dL   Borderline  102-725  mg/dL   High  >366     mg/dL   Very High Performed at [facility], [address], [place], [place] [address]   TSH     Status: None   Collection Time: 12/04/18  6:41 PM  Result Value Ref Range   TSH 0.632 0.350 - 4.500 uIU/mL    Comment: Performed by a 3rd Generation assay with a functional sensitivity of <=0.01 uIU/mL. Performed at [facility], [address], [place], [place] [address]     Blood Alcohol level:  Lab Results  Component Value Date   ETH <10 12/03/2018   ETH <10 05/09/2017    Metabolic Disorder Labs:  No results found for: HGBA1C, MPG No results found for: PROLACTIN Lab Results  Component Value Date   CHOL 226 (H) 12/04/2018   TRIG 118 12/04/2018   HDL 51 12/04/2018   CHOLHDL 4.4 12/04/2018   VLDL 24 12/04/2018   LDLCALC 151 (H) 12/04/2018    Current Medications: Current Facility-Administered Medications  Medication Dose Route Frequency Provider Last Rate Last Dose  . benzocaine (ORAJEL) 10 % mucosal gel   Mouth/Throat QID PRN Denzil Magnuson, NP      . hydrOXYzine (ATARAX/VISTARIL) tablet 25 mg  25 mg Oral TID PRN Rankin, Shuvon B, NP   25 mg at 12/05/18 2126  . ibuprofen (ADVIL) tablet 600 mg  600 mg Oral Q6H PRN Denzil Magnuson, NP      . OLANZapine zydis  (ZYPREXA) disintegrating tablet 5 mg  5 mg Oral BID Rankin, Shuvon B, NP   5 mg at 12/06/18 0713  . traZODone (DESYREL) tablet 100 mg  100 mg Oral QHS PRN Nira Conn A, NP   100 mg at 12/05/18 2126   PTA Medications: Medications Prior to Admission  Medication Sig Dispense Refill Last Dose  . amantadine (SYMMETREL) 100 MG  capsule Take 1 capsule (100 mg total) by mouth 2 (two) times daily. (Patient not taking: Reported on 06/14/2017) 60 capsule 0 Not Taking at Unknown time  . hydrOXYzine (ATARAX/VISTARIL) 25 MG tablet Take 1 tablet (25 mg total) by mouth every 6 (six) hours as needed for anxiety. (Patient not taking: Reported on 07/01/2018) 20 tablet 0 Not Taking at Unknown time  . levocetirizine (XYZAL) 5 MG tablet Take 1 tablet (5 mg total) by mouth every evening. (Patient not taking: Reported on 05/09/2017) 30 tablet 0 Not Taking at Unknown time  . meloxicam (MOBIC) 7.5 MG tablet Take 1 tablet (7.5 mg total) by mouth daily. AS NEEDED FOR HEADACHE. (Patient not taking: Reported on 07/01/2018) 20 tablet 0 Not Taking at Unknown time  . Oxcarbazepine (TRILEPTAL) 300 MG tablet Take 1 tablet (300 mg total) by mouth 2 (two) times daily. (Patient not taking: Reported on 05/09/2017) 60 tablet 0 Not Taking at Unknown time  . paliperidone (INVEGA) 3 MG 24 hr tablet Take 1 tablet (3 mg total) by mouth daily. (Patient not taking: Reported on 05/09/2017) 30 tablet 0 Not Taking at Unknown time  . topiramate (TOPAMAX) 50 MG tablet Take 1 tablet (50 mg total) by mouth 2 (two) times daily. (Patient not taking: Reported on 07/01/2018) 30 tablet 0 Not Taking at Unknown time  . triamterene-hydrochlorothiazide (MAXZIDE-25) 37.5-25 MG tablet Take 1 tablet by mouth daily. (Patient not taking: Reported on 05/09/2017) 30 tablet 11 Not Taking at Unknown time    Musculoskeletal: Strength & Muscle Tone: within normal limits Gait & Station: normal Patient leans: N/A  Psychiatric Specialty Exam: Physical Exam   Constitutional: She appears well-developed and well-nourished.  Neck: Normal range of motion.  Respiratory: Effort normal.  Musculoskeletal: Normal range of motion.  Neurological: She is alert.  Skin: Skin is warm and dry.  Psychiatric: Her mood appears anxious. Her affect is labile. Her speech is tangential. Thought content is paranoid. She expresses impulsivity. She exhibits abnormal recent memory.    Review of Systems  Psychiatric/Behavioral: Depression: Denies. Hallucinations: Denies. Substance abuse: Denies. Suicidal ideas: Denies. The patient is nervous/anxious.     Last menstrual period 11/20/2018.There is no height or weight on file to calculate BMI.  General Appearance: Casual  Eye Contact:  Good  Speech:  Pressured  Volume:  Increased  Mood:  Anxious and Irritable  Affect:  Labile  Thought Process:  Disorganized  Orientation:  Full (Time, Place, and Person)  Thought Content:  Delusions, Paranoid Ideation and Rumination  Suicidal Thoughts:  No  Homicidal Thoughts:  No  Memory:  Immediate;   Poor Recent;   Poor Remote;   Poor  Judgement:  Impaired  Insight:  Lacking  Psychomotor Activity:  Restlessness  Concentration:  Concentration: Poor  Recall:  Poor  Fund of Knowledge:  Fair  Language:  Fair  Akathisia:  No  Handed:  Right  AIMS (if indicated):   N/A  Assets:  Desire for Improvement Housing Social Support  ADL's:  Intact  Cognition:  Impaired,  Mild  Sleep:   N/A   Treatment Plan Summary: Daily contact with patient to assess and evaluate symptoms and progress in treatment, Medication management and Plan Inpatient psychiatric treatment recommended  Medication Management:   -Increase Zyprexa Zydis 5 mg BID to 5 mg in morning and 10 mg at bedtime for mood stabilization and psychosis. -Continue Trazodone 100 mg Q hs prn insomnia, Vistaril 25 mg Tid prn anxiety.   Continue to look for psychiatric inpatient bed  for treatment and stabilization.  Shuvon  RankinAssunta Found, NP 6/4/20209:18 AM   Patient seen face-to-face for psychiatric evaluation, chart reviewed and case discussed with the physician extender and developed treatment plan. Reviewed the information documented and agree with the treatment plan.  Juanetta BeetsJacqueline Ladarious Kresse, DO 12/06/18 9:18 AM

## 2018-12-06 NOTE — Progress Notes (Signed)
Individuals involved in event today during medication administration for agitation is this Clinical research associate, Latanya Presser RN, MHT Art, MHT Alden Server, and MHT AJ,and TTS Gwen.  Pt did transfer to 500 unit Adult at approximately 1700.

## 2018-12-06 NOTE — Progress Notes (Signed)
Pt accepted to [facility], Bed 500-2  Shuvon Rankin, NP is the accepting provider.  Malvin Johns, MD is the attending provider.  Call report to [phone]  [email or web address], [email or web address]  Observation notified.   Pt is IVC   Pt may be transported by [facility] Pt scheduled  to arrive at Adult Unit WHEN PATIENT IS AWAKE AND BED IS AVAILABLE.  Timmothy Euler. Kaylyn Lim, MSW, LCSW Disposition Clinical Social Work [phone] (cell) [phone] (office)

## 2018-12-06 NOTE — Progress Notes (Signed)
D: Pt alert and affect/mood in agitated.  Pt denies experiencing any pain, SI/HI, or AVH at this time however can be passive.  A: Scheduled medications administered to pt, per MD orders. Support and encouragement provided. Routine safety checks conducted q15 minutes.   R: No adverse drug reactions noted. Pt verbally contracts for safety at this time. Pt complaint with medications. Pt does not interact well with anyone on the unit. Pt remains safe at this time. Will continue to monitor.
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
## 2018-12-06 NOTE — Progress Notes (Signed)
D: Pt is asleep in room. Breathing is unlabored and even. Pt does not appear to be in any distress. A: Pt is on continuous 1:1 with sitter at her side. R: Pt remains safe on the unit at this time. Will continue to monitor.

## 2018-12-06 NOTE — Discharge Summary (Addendum)
  Patient transferred to [facility] inpatient for psychiatric treatment  Shuvon B. Rankin, NP  Patient seen face-to-face for psychiatric evaluation, chart reviewed and case discussed with the physician extender and developed treatment plan. Reviewed the information documented and agree with the treatment plan.  Juanetta Beets, DO 12/06/18 2:52 PM

## 2018-12-06 NOTE — BH Assessment (Signed)
[facility] Assessment Progress Note  Per Juanetta Beets, DO, this pt continues to require psychiatric hospitalization.  Pt remains under IVC initiated by EDP Raeford Razor, MD.  Malva Limes, RN, [facility] has assigned pt to [facility] Rm 500-2.  Pt is currently sleeping, and she can be moved once she awakens.  Pt's nurse has been notified.   Doylene Canning, [place] [facility] Coordinator [phone]

## 2018-12-07 MED ORDER — TEMAZEPAM 15 MG PO CAPS
45.0000 mg | ORAL_CAPSULE | Freq: Every day | ORAL | Status: DC
  Administered 2018-12-07: 45 mg via ORAL
  Filled 2018-12-07: qty 3

## 2018-12-07 MED ORDER — LORAZEPAM 1 MG PO TABS
2.0000 mg | ORAL_TABLET | Freq: Once | ORAL | Status: AC
  Administered 2018-12-07: 2 mg via ORAL

## 2018-12-07 MED ORDER — DIVALPROEX SODIUM 250 MG PO DR TAB
250.0000 mg | DELAYED_RELEASE_TABLET | Freq: Three times a day (TID) | ORAL | Status: DC
  Administered 2018-12-07 – 2018-12-10 (×10): 250 mg via ORAL
  Filled 2018-12-07 (×15): qty 1

## 2018-12-07 MED ORDER — HALOPERIDOL 5 MG PO TABS
10.0000 mg | ORAL_TABLET | Freq: Three times a day (TID) | ORAL | Status: DC
  Administered 2018-12-07 (×2): 10 mg via ORAL
  Administered 2018-12-07: 5 mg via ORAL
  Administered 2018-12-08 – 2018-12-10 (×7): 10 mg via ORAL
  Filled 2018-12-07: qty 2
  Filled 2018-12-07: qty 1
  Filled 2018-12-07 (×13): qty 2

## 2018-12-07 MED ORDER — HALOPERIDOL 5 MG PO TABS
5.0000 mg | ORAL_TABLET | Freq: Once | ORAL | Status: AC
  Administered 2018-12-07: 5 mg via ORAL

## 2018-12-07 MED ORDER — HALOPERIDOL LACTATE 5 MG/ML IJ SOLN: 5.0000 mg | Freq: Once | INTRAMUSCULAR | Status: AC

## 2018-12-07 MED ORDER — DIPHENHYDRAMINE HCL 50 MG PO CAPS
50.0000 mg | ORAL_CAPSULE | Freq: Three times a day (TID) | ORAL | Status: DC
  Administered 2018-12-07 – 2018-12-10 (×8): 50 mg via ORAL
  Filled 2018-12-07 (×4): qty 1
  Filled 2018-12-07: qty 2
  Filled 2018-12-07 (×9): qty 1

## 2018-12-07 MED ORDER — LORAZEPAM 2 MG/ML IJ SOLN: 2.0000 mg | Freq: Once | INTRAMUSCULAR | Status: AC

## 2018-12-07 NOTE — Progress Notes (Addendum)
Nursing 1:1Note  D: Pt observed resting in bed with eyes open.No distress noted. Pt woke up a few times last night but went back to sleep. Pt would ask for snacks/fluids which was given to her. Pt observe talking to herself loudly. Pt cooperative and redirectable.  A:1:1 observation continues for safety. Sitter within arms reach. R:Ptremains safe.

## 2018-12-07 NOTE — Progress Notes (Signed)
Nursing 1:1 Note  D: Pt observed resting in bed with eyes closed. Respirations even and unlabored. No distress noted.  A: 1:1 observation continues for safety. Sitter within arms reach.  R: Pt remains safe.

## 2018-12-07 NOTE — Tx Team (Signed)
Interdisciplinary Treatment and Diagnostic Plan Update  12/07/2018 Time of Session: 0915 Wendy Berry MRN: [id number]  Principal Diagnosis: Paranoid type delusional disorder [facility])  Secondary Diagnoses: Principal Problem:   Paranoid type delusional disorder ([place]) Active Problems:   Psychotic disorder ([place])   Current Medications:  Current Facility-Administered Medications  Medication Dose Route Frequency Provider Last Rate Last Dose  . benzocaine (ORAJEL) 10 % mucosal gel   Mouth/Throat QID PRN Rankin, Shuvon B, NP      . diphenhydrAMINE (BENADRYL) capsule 50 mg  50 mg Oral Q6H PRN [name], MD   50 mg at 12/07/18 1435   Or  . diphenhydrAMINE (BENADRYL) injection 50 mg  50 mg Intramuscular Q6H PRN [name], MD      . diphenhydrAMINE (BENADRYL) capsule 50 mg  50 mg Oral TID [name], MD   50 mg at 12/07/18 1208  . divalproex (DEPAKOTE) DR tablet 250 mg  250 mg Oral TID [name], MD   250 mg at 12/07/18 1208  . haloperidol (HALDOL) tablet 10 mg  10 mg Oral TID [name], MD   10 mg at 12/07/18 1208  . haloperidol (HALDOL) tablet 5 mg  5 mg Oral Q6H PRN [name], MD   5 mg at 12/07/18 0930   Or  . haloperidol lactate (HALDOL) injection 5 mg  5 mg Intramuscular Q6H PRN [name], MD      . hydrOXYzine (ATARAX/VISTARIL) tablet 25 mg  25 mg Oral TID PRN Rankin, Shuvon B, NP   25 mg at 12/07/18 1437  . ibuprofen (ADVIL) tablet 600 mg  600 mg Oral Q6H PRN Rankin, Shuvon B, NP   600 mg at 12/06/18 2225  . LORazepam (ATIVAN) tablet 2 mg  2 mg Oral Q6H PRN [name], MD   2 mg at 12/07/18 0740   Or  . LORazepam (ATIVAN) injection 2 mg  2 mg Intramuscular Q6H PRN [name], MD      . temazepam (RESTORIL) capsule 45 mg  45 mg Oral QHS [name], MD      . traZODone (DESYREL) tablet 100 mg  100 mg Oral QHS PRN Rankin, Shuvon B, NP   100 mg at 12/05/18 2126  . triamterene-hydrochlorothiazide (MAXZIDE-25) 37.5-25 MG per tablet 1 tablet  1 tablet Oral Daily  Rankin, Shuvon B, NP   1 tablet at 12/07/18 0730  . ziprasidone (GEODON) injection 20 mg  20 mg Intramuscular Q12H PRN [name], MD       PTA Medications: Medications Prior to Admission  Medication Sig Dispense Refill Last Dose  . amantadine (SYMMETREL) 100 MG capsule Take 1 capsule (100 mg total) by mouth 2 (two) times daily. (Patient not taking: Reported on 06/14/2017) 60 capsule 0 Not Taking at Unknown time  . hydrOXYzine (ATARAX/VISTARIL) 25 MG tablet Take 1 tablet (25 mg total) by mouth every 6 (six) hours as needed for anxiety. (Patient not taking: Reported on 07/01/2018) 20 tablet 0 Not Taking at Unknown time  . levocetirizine (XYZAL) 5 MG tablet Take 1 tablet (5 mg total) by mouth every evening. (Patient not taking: Reported on 05/09/2017) 30 tablet 0 Not Taking at Unknown time  . meloxicam (MOBIC) 7.5 MG tablet Take 1 tablet (7.5 mg total) by mouth daily. AS NEEDED FOR HEADACHE. (Patient not taking: Reported on 07/01/2018) 20 tablet 0 Not Taking at Unknown time  . Oxcarbazepine (TRILEPTAL) 300 MG tablet Take 1 tablet (300 mg total) by mouth 2 (two) times daily. (Patient not taking: Reported  on 05/09/2017) 60 tablet 0 Not Taking at Unknown time  . paliperidone (INVEGA) 3 MG 24 hr tablet Take 1 tablet (3 mg total) by mouth daily. (Patient not taking: Reported on 05/09/2017) 30 tablet 0 Not Taking at Unknown time  . topiramate (TOPAMAX) 50 MG tablet Take 1 tablet (50 mg total) by mouth 2 (two) times daily. (Patient not taking: Reported on 07/01/2018) 30 tablet 0 Not Taking at Unknown time  . triamterene-hydrochlorothiazide (MAXZIDE-25) 37.5-25 MG tablet Take 1 tablet by mouth daily. (Patient not taking: Reported on 05/09/2017) 30 tablet 11 Not Taking at Unknown time    Patient Stressors:    Patient Strengths:    Treatment Modalities: Medication Management, Group therapy, Case management,  1 to 1 session with clinician, Psychoeducation, Recreational therapy.   Physician Treatment Plan  for Primary Diagnosis: Paranoid type delusional disorder ([place]) Long Term Goal(s): Improvement in symptoms so as ready for discharge Improvement in symptoms so as ready for discharge   Short Term Goals: Ability to demonstrate self-control will improve Compliance with prescribed medications will improve  Medication Management: Evaluate patient's response, side effects, and tolerance of medication regimen.  Therapeutic Interventions: 1 to 1 sessions, Unit Group sessions and Medication administration.  Evaluation of Outcomes: Not Met  Physician Treatment Plan for Secondary Diagnosis: Principal Problem:   Paranoid type delusional disorder ([place]) Active Problems:   Psychotic disorder ([place])  Long Term Goal(s): Improvement in symptoms so as ready for discharge Improvement in symptoms so as ready for discharge   Short Term Goals: Ability to demonstrate self-control will improve Compliance with prescribed medications will improve     Medication Management: Evaluate patient's response, side effects, and tolerance of medication regimen.  Therapeutic Interventions: 1 to 1 sessions, Unit Group sessions and Medication administration.  Evaluation of Outcomes: Not Met   RN Treatment Plan for Primary Diagnosis: Paranoid type delusional disorder ([place]) Long Term Goal(s): Knowledge of disease and therapeutic regimen to maintain health will improve  Short Term Goals: Ability to identify and develop effective coping behaviors will improve and Compliance with prescribed medications will improve  Medication Management: RN will administer medications as ordered by provider, will assess and evaluate patient's response and provide education to patient for prescribed medication. RN will report any adverse and/or side effects to prescribing provider.  Therapeutic Interventions: 1 on 1 counseling sessions, Psychoeducation, Medication administration, Evaluate responses to treatment, Monitor vital signs and CBGs  as ordered, Perform/monitor CIWA, COWS, AIMS and Fall Risk screenings as ordered, Perform wound care treatments as ordered.  Evaluation of Outcomes: Not Met   LCSW Treatment Plan for Primary Diagnosis: Paranoid type delusional disorder ([place]) Long Term Goal(s): Safe transition to appropriate next level of care at discharge, Engage patient in therapeutic group addressing interpersonal concerns.  Short Term Goals: Engage patient in aftercare planning with referrals and resources, Increase social support and Increase skills for wellness and recovery  Therapeutic Interventions: Assess for all discharge needs, 1 to 1 time with Social worker, Explore available resources and support systems, Assess for adequacy in community support network, Educate family and significant other(s) on suicide prevention, Complete Psychosocial Assessment, Interpersonal group therapy.  Evaluation of Outcomes: Not Met   Progress in Treatment: Attending groups: No. Participating in groups: No. Taking medication as prescribed: Yes. Toleration medication: Yes. Family/Significant other contact made: No, will contact:  friend Patient understands diagnosis: No. Discussing patient identified problems/goals with staff: Yes. Medical problems stabilized or resolved: Yes. Denies suicidal/homicidal ideation: Yes. Issues/concerns per patient self-inventory: No. Other:  none  New problem(s) identified: No, Describe:  none  New Short Term/Long Term Goal(s):  Patient Goals:  discharge  Discharge Plan or Barriers:   Reason for Continuation of Hospitalization: Delusions  Medication stabilization  Estimated Length of Stay:3-5 days  Attendees: Patient: Wendy Berry 12/07/2018   Physician: Dr. Jake Samples, MD 12/07/2018   Nursing:  12/07/2018   RN Care Manager: 12/07/2018   Social Worker: Lurline Idol, LCSW 12/07/2018   Recreational Therapist:  12/07/2018   Other:  12/07/2018   Other:  12/07/2018   Other: 12/07/2018     Scribe for  Treatment Team: Joanne Chars, [place] 12/07/2018 2:53 PM

## 2018-12-07 NOTE — Progress Notes (Signed)
Patient ID: Wendy Berry, female   DOB: 05/17/1986, 33 y.o.   MRN: [id number] Pt appears to be cheeking medications given. In pt trash can was about 8 pill that appeared to have been given to pt during the day.

## 2018-12-07 NOTE — Progress Notes (Signed)
Patient ID: Wendy Berry, female   DOB: [birth year], 33 y.o.   MRN: [id number]  D: Pt labile and verbally threatening and aggressive. Pt denies SI/HI, A/VH, but observed arguing and responding loudly to internal stimuli/ Pt was redirectable at times. A: Education, support and encouragement provided, q15 minute safety checks remain in effect. Medications administered per MD orders. R: No reactions/side effects to medicine noted. Pt denies any concerns at this time. Pt ambulating on the unit with no issues. Pt remains safe on the unit.
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
## 2018-12-07 NOTE — Progress Notes (Signed)
Patient ID: Wendy Berry, female   DOB: 05/08/1986, 33 y.o.   MRN: [id number]  D: Pt asleep in her bed with even and unlabored respirations. No discomfort or distress noted.  A: Close observation continues for safety of pt. R: Pt remains safe on the unit.Marland Kitchen

## 2018-12-07 NOTE — Progress Notes (Signed)
Patient ID: Wendy Berry, female   DOB: 05/25/1986, 33 y.o.   MRN: [id number]   D: Pt ate dinner sitting on her bed in her room. Pt sat in the dayroom watching television. Pt returned to her room to lay on her bed. Pt awake with no distress or discomfort noted.  A: Close observation continues for safety of pt R: Pt remains safe on the unit.Marland Kitchen

## 2018-12-07 NOTE — Progress Notes (Signed)
Patient ID: Wendy Berry, female   DOB: 1986-02-19, 33 y.o.   MRN: [id number]  [place] NOVEL CORONAVIRUS (COVID-19) DAILY CHECK-OFF SYMPTOMS - answer yes or no to each - every day NO YES  Have you had a fever in the past 24 hours?  . Fever (Temp > 37.80C / 100F) X   Have you had any of these symptoms in the past 24 hours? . New Cough .  Sore Throat  .  Shortness of Breath .  Difficulty Breathing .  Unexplained Body Aches   X   Have you had any one of these symptoms in the past 24 hours not related to allergies?   . Runny Nose .  Nasal Congestion .  Sneezing   X   If you have had runny nose, nasal congestion, sneezing in the past 24 hours, has it worsened?  X   EXPOSURES - check yes or no X   Have you traveled outside the state in the past 14 days?  X   Have you been in contact with someone with a confirmed diagnosis of COVID-19 or PUI in the past 14 days without wearing appropriate PPE?  X   Have you been living in the same home as a person with confirmed diagnosis of COVID-19 or a PUI (household contact)?    X   Have you been diagnosed with COVID-19?    X              What to do next: Answered NO to all: Answered YES to anything:   Proceed with unit schedule Follow the [facility] Inpatient Flowsheet.

## 2018-12-07 NOTE — Plan of Care (Signed)
  Problem: Education: Goal: Emotional status will improve Outcome: Progressing Goal: Mental status will improve Outcome: Progressing   

## 2018-12-07 NOTE — Progress Notes (Signed)
Patient ID: Wendy Berry, female   DOB: 1985-09-02, 33 y.o.   MRN: [id number]  1:1 RN note  D: Pt yelling and cursing on the phone. Pt observed yelling and arguing responding to internal stimuli. Pt medicated per MD orders. Pt calm in dayroom with sitter.  A: Close observation continues for safety of pt R: Pt remains safe on the unit.Marland Kitchen

## 2018-12-07 NOTE — Progress Notes (Signed)
Patient ID: Wendy Berry, female   DOB: 01-Apr-1986, 33 y.o.   MRN: [id number] 1:1 notes  12/07/2018 @ 2200  D: Patient sleeping at beginning of shift. When pt woke up she requested snack which was given. Pt mad a phone call and went back to bed. Pt had covers over her head and talking to herself. Writer talked to pt who seem preoccupied but answered all  question.  A: medication administered as prescribed. Closed observation for safety R: Patient remains safe. Sitter at door. Closed observation continues

## 2018-12-07 NOTE — Progress Notes (Signed)
Recreation Therapy Notes  Date: 6.5.20 Time: 1000 Location: [address] Day Room   Group Topic: Leisure Education  Goal Area(s) Addresses:  Patient will identify positive leisure activities.  Patient will identify one positive benefit of participation in leisure activities.   Behavioral Response: Minimal  Intervention: Leisure Group Game  Activity: Pictionary/Charades.  Patients could choose a word from the "random" or the "leisure activities" containers.  Patients then had the choice of either drawing or acting out what was on the paper.  The rest of the group had to guess what it was.  The person that made the correct guess, would get the next turn.  Education:  Leisure Education, Building control surveyor  Education Outcome: Acknowledges education/In group clarification offered/Needs additional education  Clinical Observations/Feedback: Patient was talking to herself during most of group.  Pt was saying things like "white trash", "cracker" and make other remarks to herself.  Pt wasn't receptive to interacting with her peers.      Caroll Rancher, LRT/CTRS         Lillia Abed, Traycen Goyer A 12/07/2018 11:02 AM

## 2018-12-07 NOTE — [facility] Suicide Risk Assessment (Signed)
[facility] Admission Suicide Risk Assessment   Total Time spent with patient: 45 minutes Principal Problem: Paranoid type delusional disorder (HCC) Diagnosis:  Principal Problem:   Paranoid type delusional disorder (HCC) Active Problems:   Psychotic disorder (HCC)  Subjective Data: Loud pressured rambling disorganized can be redirected for brief periods of time.  Continued Clinical Symptoms:  Alcohol Use Disorder Identification Test Final Score (AUDIT): 0 The "Alcohol Use Disorders Identification Test", Guidelines for Use in Primary Care, Second Edition.  World Science writer [facility]). Score between 0-7:  no or low risk or alcohol related problems. Score between 8-15:  moderate risk of alcohol related problems. Score between 16-19:  high risk of alcohol related problems. Score 20 or above:  warrants further diagnostic evaluation for alcohol dependence and treatment.   CLINICAL FACTORS:   Schizophrenia:   Less than 36 years old     COGNITIVE FEATURES THAT CONTRIBUTE TO RISK:  Loss of executive function    SUICIDE RISK:   Minimal: No identifiable suicidal ideation.  Patients presenting with no risk factors but with morbid ruminations; may be classified as minimal risk based on the severity of the depressive symptoms  PLAN OF CARE: No acute thoughts of harming self or others quite disorganized at present and pressured/manic see admission note  I certify that inpatient services furnished can reasonably be expected to improve the patient's condition.   Malvin Johns, MD 12/07/2018, 8:20 AM

## 2018-12-08 DIAGNOSIS — F22 Delusional disorders: Secondary | ICD-10-CM

## 2018-12-08 MED ORDER — HALOPERIDOL LACTATE 5 MG/ML IJ SOLN: 10.0000 mg | INTRAMUSCULAR | Status: AC

## 2018-12-08 MED ORDER — HALOPERIDOL 5 MG PO TABS
10.0000 mg | ORAL_TABLET | ORAL | Status: AC
  Administered 2018-12-08: 10 mg via ORAL

## 2018-12-08 NOTE — Progress Notes (Signed)
1:1 note- Patient is currently lying in bed asleep with no distress noted. 1:1 continues and patient is safe with sitter at bedside.

## 2018-12-08 NOTE — Progress Notes (Signed)
[facility]  12/08/2018 9:29 AM Rosalita Levanamecia R Spillers  MRN:  [id number] Subjective: Patient is a 33 year old female with a past psychiatric history significant for schizophrenia who was admitted on 12/07/2018 with worsening delusional and paranoia behavior.  Objective: Patient is seen and examined.  Patient is a 33 year old female with a past psychiatric history significant for schizophrenia.  She is seen in follow-up.  She was admitted yesterday because of an exacerbation of her illness.  She has been noncompliant with oral medications today.  She is continued to be paranoid.  During the evaluation today she is clearly responding to internal stimuli.  She is having visual hallucinations.  She is agitated and causing disturbances.  Her blood pressure is elevated 151/101.  She is afebrile.  She only slept 2.25 hours last night.  Principal Problem: Paranoid type delusional disorder (HCC) Diagnosis: Principal Problem:   Paranoid type delusional disorder (HCC) Active Problems:   Psychotic disorder (HCC)  Total Time spent with patient: 15 minutes  Past Psychiatric History: See admission H&P  Past Medical History:  Past Medical History:  Diagnosis Date  . Cerebral aneurysm   . Heart murmur   . Paranoid schizophrenia [facility](HCC)     Past Surgical History:  Procedure Laterality Date  . BRAIN SURGERY     Family History: History reviewed. No pertinent family history. Family Psychiatric  History: An H&P Social History:  Social History   Substance and Sexual Activity  Alcohol Use No     Social History   Substance and Sexual Activity  Drug Use No    Social History   Socioeconomic History  . Marital status: Single    Spouse name: Not on file  . Number of children: Not on file  . Years of education: Not on file  . Highest education level: Not on file  Occupational History  . Not on file  Social Needs  . Financial resource strain: Not on file  . Food insecurity:    Worry: Not on  file    Inability: Not on file  . Transportation needs:    Medical: Not on file    Non-medical: Not on file  Tobacco Use  . Smoking status: Never Smoker  . Smokeless tobacco: Never Used  Substance and Sexual Activity  . Alcohol use: No  . Drug use: No  . Sexual activity: Not on file  Lifestyle  . Physical activity:    Days per week: Not on file    Minutes per session: Not on file  . Stress: Not on file  Relationships  . Social connections:    Talks on phone: Not on file    Gets together: Not on file    Attends religious service: Not on file    Active member of club or organization: Not on file    Attends meetings of clubs or organizations: Not on file    Relationship status: Not on file  Other Topics Concern  . Not on file  Social History Narrative  . Not on file   Additional Social History:                         Sleep: Poor  Appetite:  Fair  Current Medications: Current Facility-Administered Medications  Medication Dose Route Frequency Provider Last Rate Last Dose  . benzocaine (ORAJEL) 10 % mucosal gel   Mouth/Throat QID PRN Rankin, Shuvon B, NP      . diphenhydrAMINE (BENADRYL) capsule 50 mg  50 mg Oral Q6H PRN [name], MD   50 mg at 12/07/18 1435   Or  . diphenhydrAMINE (BENADRYL) injection 50 mg  50 mg Intramuscular Q6H PRN [name], MD      . diphenhydrAMINE (BENADRYL) capsule 50 mg  50 mg Oral TID [name], MD   50 mg at 12/08/18 0805  . divalproex (DEPAKOTE) DR tablet 250 mg  250 mg Oral TID [name], MD   250 mg at 12/08/18 0805  . haloperidol (HALDOL) tablet 10 mg  10 mg Oral TID [name], MD   10 mg at 12/08/18 0805  . haloperidol (HALDOL) tablet 10 mg  10 mg Oral NOW Sharma Covert, MD       Or  . haloperidol lactate (HALDOL) injection 10 mg  10 mg Intramuscular NOW Sharma Covert, MD      . haloperidol (HALDOL) tablet 5 mg  5 mg Oral Q6H PRN [name], MD   5 mg at 12/07/18 0930   Or  . haloperidol lactate  (HALDOL) injection 5 mg  5 mg Intramuscular Q6H PRN [name], MD      . hydrOXYzine (ATARAX/VISTARIL) tablet 25 mg  25 mg Oral TID PRN Rankin, Shuvon B, NP   25 mg at 12/07/18 1437  . ibuprofen (ADVIL) tablet 600 mg  600 mg Oral Q6H PRN Rankin, Shuvon B, NP   600 mg at 12/06/18 2225  . LORazepam (ATIVAN) tablet 2 mg  2 mg Oral Q6H PRN [name], MD   2 mg at 12/07/18 2118   Or  . LORazepam (ATIVAN) injection 2 mg  2 mg Intramuscular Q6H PRN [name], MD      . temazepam (RESTORIL) capsule 45 mg  45 mg Oral QHS [name], MD   45 mg at 12/07/18 2118  . traZODone (DESYREL) tablet 100 mg  100 mg Oral QHS PRN Rankin, Shuvon B, NP   100 mg at 12/05/18 2126  . triamterene-hydrochlorothiazide (MAXZIDE-25) 37.5-25 MG per tablet 1 tablet  1 tablet Oral Daily Rankin, Shuvon B, NP   1 tablet at 12/08/18 0804  . ziprasidone (GEODON) injection 20 mg  20 mg Intramuscular Q12H PRN [name], MD   20 mg at 12/07/18 1457    Lab Results: No results found for this or any previous visit (from the past 67 hour(s)).  Blood Alcohol level:  Lab Results  Component Value Date   ETH <10 12/03/2018   ETH <10 29/92/4268    Metabolic Disorder Labs: No results found for: HGBA1C, MPG No results found for: PROLACTIN Lab Results  Component Value Date   CHOL 226 (H) 12/04/2018   TRIG 118 12/04/2018   HDL 51 12/04/2018   CHOLHDL 4.4 12/04/2018   VLDL 24 12/04/2018   LDLCALC 151 (H) 12/04/2018    Physical Findings: AIMS:  , ,  ,  ,    CIWA:    COWS:     Musculoskeletal: Strength & Muscle Tone: within normal limits Gait & Station: normal Patient leans: N/A  Psychiatric Specialty Exam: Physical Exam  Nursing note and vitals reviewed. Constitutional: She is oriented to person, place, and time. She appears well-developed and well-nourished.  HENT:  Head: Normocephalic and atraumatic.  Respiratory: Effort normal.  Neurological: She is alert and oriented to person, place, and time.    ROS   Blood pressure (!) 151/101, pulse 88, temperature 98 F (36.7 C), temperature source Oral, resp. rate 20, last menstrual period 11/20/2018.There is no height  or weight on file to calculate BMI.  General Appearance: Disheveled  Eye Contact:  Good  Speech:  Pressured  Volume:  Increased  Mood:  Angry, Dysphoric and Irritable  Affect:  Labile  Thought Process:  Disorganized and Descriptions of Associations: Loose  Orientation:  Negative  Thought Content:  Delusions, Hallucinations: Auditory Command:   paranoid in nature Visual and Paranoid Ideation  Suicidal Thoughts:  No  Homicidal Thoughts:  Yes.  without intent/plan  Memory:  Immediate;   Poor Recent;   Poor Remote;   Poor  Judgement:  Impaired  Insight:  Lacking  Psychomotor Activity:  Increased  Concentration:  Concentration: Fair and Attention Span: Fair  Recall:  [employer]  Fund of Knowledge:  Fair  Language:  Good  Akathisia:  Negative  Handed:  Right  AIMS (if indicated):     Assets:  Resilience  ADL's:  Intact  Cognition:  WNL  Sleep:  Number of Hours: 2.25     Treatment Plan Summary: Daily contact with patient to assess and evaluate symptoms and progress in treatment, Medication management and Plan : Patient is seen and examined.  Patient is a 33 year old female with the above-stated past psychiatric history who is seen in follow-up.   Diagnosis: #1 chronic paranoid schizophrenia.  Patient is a 33 year old female with the above-stated past psychiatric history is seen in follow-up.  She continues to be paranoid.  She is having auditory and visual hallucinations.  We were able to give her 10 mg of Haldol this morning.  She is currently written for Haldol 10 mg p.o. 3 times daily.  We will continue the oral for right now, but I am also going to put in the agitation protocol.  If necessary we may need to do forced medications.  Also long-acting injectable for her may be a good option.  Review of her laboratories showed a  mildly elevated creatinine, urinalysis from 6/1 showed no bacteria.  Drug screen was negative. 1.  Continue Depakote DR 250 mg p.o. 3 times daily for mood stability. 2.  Continue haloperidol 10 mg p.o. 3 times daily for chronic paranoid schizophrenia. 3.  Continue PRN Haldol for agitation either IM or p.o. 4.  Continue hydroxyzine 25 mg p.o. 3 times daily as needed anxiety. 5.  Continue lorazepam 2 mg p.o. or IM every 6 hours as needed anxiety or agitation. 6.  Continue temazepam 45 mg p.o. nightly for insomnia. 7.  Continue trazodone 100 mg p.o. nightly as needed insomnia. 8.  Continue triamterene-hydrochlorothiazide 37.5-25 mg p.o. daily for hypertension. 9.  Continue Geodon 20 mg p.o. every 12 hours PRN agitation. 10.  Disposition planning-in progress.  Antonieta PertGreg Lawson , MD 12/08/2018, 9:29 AM

## 2018-12-08 NOTE — Progress Notes (Signed)
[place] NOVEL CORONAVIRUS (COVID-19) DAILY CHECK-OFF SYMPTOMS - answer yes or no to each - every day NO YES  Have you had a fever in the past 24 hours?  . Fever (Temp > 37.80C / 100F) X   Have you had any of these symptoms in the past 24 hours? . New Cough .  Sore Throat  .  Shortness of Breath .  Difficulty Breathing .  Unexplained Body Aches   X   Have you had any one of these symptoms in the past 24 hours not related to allergies?   . Runny Nose .  Nasal Congestion .  Sneezing   X   If you have had runny nose, nasal congestion, sneezing in the past 24 hours, has it worsened?  X   EXPOSURES - check yes or no X   Have you traveled outside the state in the past 14 days?  X   Have you been in contact with someone with a confirmed diagnosis of COVID-19 or PUI in the past 14 days without wearing appropriate PPE?  X   Have you been living in the same home as a person with confirmed diagnosis of COVID-19 or a PUI (household contact)?    X   Have you been diagnosed with COVID-19?    X              What to do next: Answered NO to all: Answered YES to anything:   Proceed with unit schedule Follow the [facility] Inpatient Flowsheet.   

## 2018-12-08 NOTE — Progress Notes (Signed)
Nursing  1:1 Note  D: Patient resting in bed with eyes closed - appears to be sleeping- respirations even and unlabored A: Close observation continues for pt's safety R: Pt remains safe on the unit

## 2018-12-08 NOTE — Progress Notes (Signed)
D Pt presents as delusional and paranoid- Pt was initially friendly upon initial approach this am, then became verbally aggressive , calling this Probation officer a "white cracker" - Per pt's self inventory, pt rated her depression, hopelessness and anxiety a 0/0/2, respectively. Pt currently denies suicidal ideations A. Labs and vitals monitored. Pt noncompliant with medications for this nurse, but will take if offered by other RN R. Pt remains safe with 15 minute checks and 1:1 Close observation Will continue POC.

## 2018-12-08 NOTE — Progress Notes (Signed)
Patient ID: FRANCILLE WITTMANN, female   DOB: 02/08/1986, 33 y.o.   MRN: [id number] 1:1 notes  12/08/2018 @ 0600  D: Patient up to use bathroom. Pt has been sleeping for several hour prior.  Pt denies any needs. No sign of distress noted at this time A: closed observation for safety R: Patient is safe. Sitter at door closed observation continues

## 2018-12-08 NOTE — [facility] Group Notes (Signed)
[facility] Group Notes: (Clinical Social Work)   12/08/2018      Type of Therapy:  Group Therapy   Participation Level:  Did Not Attend - was invited both individually by [facility] and by overhead announcement, chose not to attend.   Marquarius Lofton Grossman-Orr, LCSW 12/08/2018, 12:47 PM     

## 2018-12-08 NOTE — Progress Notes (Signed)
Nursing 1:1 Note  D: Patient agitated, standing in the hall, making racial slurs toward staff-  Pt received prn PO and IM medications per MD's order A: Close observation continues for pt's safety R: Pt remains safe on the unit

## 2018-12-08 NOTE — Progress Notes (Signed)
Nursing 1:1 Note  D: Patient resting in bed with eyes closed- respirations even and unlabored. No distress noted at this time. 1:1 sitter within pt's view A: Close observation continues for patient's safety R: Pt remains safe on the unit

## 2018-12-08 NOTE — Progress Notes (Signed)
Patient ID: Wendy Berry, female   DOB: 23-Feb-1986, 33 y.o.   MRN: [id number] 1:1 notes  12/08/2018 @ 0200  D: Patient awake in bed. Pt still talking to herself with occational laughter. Pt is redirectable. A: medication offered but refused. Closed observation for safety R: Patient remains safe. Sitter at door. Closed observation continues

## 2018-12-09 MED ORDER — METOPROLOL SUCCINATE ER 25 MG PO TB24
25.0000 mg | ORAL_TABLET | Freq: Every day | ORAL | Status: DC
  Administered 2018-12-09 – 2018-12-10 (×2): 25 mg via ORAL
  Filled 2018-12-09 (×4): qty 1

## 2018-12-09 NOTE — Progress Notes (Signed)
[facility]  12/09/2018 9:02 AM Wendy Levanamecia R Berry  MRN:  [id number] Subjective:  Patient is a 33 year old female with a past psychiatric history significant for schizophrenia who was admitted on 12/07/2018 with worsening delusional and paranoia behavior.  Objective: Patient is seen and examined.  Patient is a 33 year old female with a past psychiatric history significant for schizophrenia.  She is seen in follow-up.  Yesterday she had a bad day.  She was significantly paranoid, profane, yelling and agitated.  She received a Geodon injection and was given oral Haldol.  She is much better today.  She is still guarded and clearly paranoid, but the profanity and yelling has decreased significantly.  She denied any auditory or visual hallucinations.  She denied any suicidal ideation.  Her blood pressure remains somewhat elevated at 151/101.  Pulse is 88.  She is afebrile.  She slept most the day yesterday, and slept overnight 3.75 hours.  No new laboratories.  Principal Problem: Paranoid type delusional disorder (HCC) Diagnosis: Principal Problem:   Paranoid type delusional disorder (HCC) Active Problems:   Psychotic disorder (HCC)  Total Time spent with patient: 15 minutes  Past Psychiatric History: See admission H&P  Past Medical History:  Past Medical History:  Diagnosis Date  . Cerebral aneurysm   . Heart murmur   . Paranoid schizophrenia [facility](HCC)     Past Surgical History:  Procedure Laterality Date  . BRAIN SURGERY     Family History: History reviewed. No pertinent family history. Family Psychiatric  History: See admission H&P Social History:  Social History   Substance and Sexual Activity  Alcohol Use No     Social History   Substance and Sexual Activity  Drug Use No    Social History   Socioeconomic History  . Marital status: Single    Spouse name: Not on file  . Number of children: Not on file  . Years of education: Not on file  . Highest education level: Not on  file  Occupational History  . Not on file  Social Needs  . Financial resource strain: Not on file  . Food insecurity:    Worry: Not on file    Inability: Not on file  . Transportation needs:    Medical: Not on file    Non-medical: Not on file  Tobacco Use  . Smoking status: Never Smoker  . Smokeless tobacco: Never Used  Substance and Sexual Activity  . Alcohol use: No  . Drug use: No  . Sexual activity: Not on file  Lifestyle  . Physical activity:    Days per week: Not on file    Minutes per session: Not on file  . Stress: Not on file  Relationships  . Social connections:    Talks on phone: Not on file    Gets together: Not on file    Attends religious service: Not on file    Active member of club or organization: Not on file    Attends meetings of clubs or organizations: Not on file    Relationship status: Not on file  Other Topics Concern  . Not on file  Social History Narrative  . Not on file   Additional Social History:                         Sleep: Fair  Appetite:  Fair  Current Medications: Current Facility-Administered Medications  Medication Dose Route Frequency Provider Last Rate Last Dose  . benzocaine (  ORAJEL) 10 % mucosal gel   Mouth/Throat QID PRN Rankin, Shuvon B, NP      . diphenhydrAMINE (BENADRYL) capsule 50 mg  50 mg Oral Q6H PRN Malvin JohnsFarah, Brian, MD   50 mg at 12/07/18 1435   Or  . diphenhydrAMINE (BENADRYL) injection 50 mg  50 mg Intramuscular Q6H PRN Malvin JohnsFarah, Brian, MD      . diphenhydrAMINE (BENADRYL) capsule 50 mg  50 mg Oral TID Malvin JohnsFarah, Brian, MD   50 mg at 12/09/18 0843  . divalproex (DEPAKOTE) DR tablet 250 mg  250 mg Oral TID Malvin JohnsFarah, Brian, MD   250 mg at 12/09/18 0843  . haloperidol (HALDOL) tablet 10 mg  10 mg Oral TID Malvin JohnsFarah, Brian, MD   10 mg at 12/09/18 0843  . haloperidol (HALDOL) tablet 5 mg  5 mg Oral Q6H PRN Malvin JohnsFarah, Brian, MD   5 mg at 12/07/18 0930   Or  . haloperidol lactate (HALDOL) injection 5 mg  5 mg Intramuscular Q6H  PRN Malvin JohnsFarah, Brian, MD      . hydrOXYzine (ATARAX/VISTARIL) tablet 25 mg  25 mg Oral TID PRN Rankin, Shuvon B, NP   25 mg at 12/07/18 1437  . ibuprofen (ADVIL) tablet 600 mg  600 mg Oral Q6H PRN Rankin, Shuvon B, NP   600 mg at 12/09/18 0146  . LORazepam (ATIVAN) tablet 2 mg  2 mg Oral Q6H PRN Malvin JohnsFarah, Brian, MD   2 mg at 12/07/18 2118   Or  . LORazepam (ATIVAN) injection 2 mg  2 mg Intramuscular Q6H PRN Malvin JohnsFarah, Brian, MD      . temazepam (RESTORIL) capsule 45 mg  45 mg Oral QHS Malvin JohnsFarah, Brian, MD   45 mg at 12/07/18 2118  . traZODone (DESYREL) tablet 100 mg  100 mg Oral QHS PRN Rankin, Shuvon B, NP   100 mg at 12/05/18 2126  . triamterene-hydrochlorothiazide (MAXZIDE-25) 37.5-25 MG per tablet 1 tablet  1 tablet Oral Daily Rankin, Shuvon B, NP   1 tablet at 12/09/18 0843  . ziprasidone (GEODON) injection 20 mg  20 mg Intramuscular Q12H PRN Malvin JohnsFarah, Brian, MD   20 mg at 12/08/18 0957    Lab Results: No results found for this or any previous visit (from the past 48 hour(s)).  Blood Alcohol level:  Lab Results  Component Value Date   ETH <10 12/03/2018   ETH <10 05/09/2017    Metabolic Disorder Labs: No results found for: HGBA1C, MPG No results found for: PROLACTIN Lab Results  Component Value Date   CHOL 226 (H) 12/04/2018   TRIG 118 12/04/2018   HDL 51 12/04/2018   CHOLHDL 4.4 12/04/2018   VLDL 24 12/04/2018   LDLCALC 151 (H) 12/04/2018    Physical Findings: AIMS:  , ,  ,  ,    CIWA:    COWS:     Musculoskeletal: Strength & Muscle Tone: within normal limits Gait & Station: normal Patient leans: N/A  Psychiatric Specialty Exam: Physical Exam  Nursing note and vitals reviewed. Constitutional: She is oriented to person, place, and time. She appears well-developed and well-nourished.  HENT:  Head: Normocephalic and atraumatic.  Respiratory: Effort normal.  Neurological: She is alert and oriented to person, place, and time.    ROS  Blood pressure (!) 151/101, pulse 88,  temperature 98 F (36.7 C), temperature source Oral, resp. rate 20, last menstrual period 11/20/2018.There is no height or weight on file to calculate BMI.  General Appearance: Casual  Eye Contact:  Fair  Speech:  Normal Rate  Volume:  Decreased  Mood:  Anxious, Dysphoric and Irritable  Affect:  Labile  Thought Process:  Goal Directed and Descriptions of Associations: Circumstantial  Orientation:  Full (Time, Place, and Person)  Thought Content:  Delusions and Paranoid Ideation  Suicidal Thoughts:  No  Homicidal Thoughts:  No  Memory:  Immediate;   Fair Recent;   Fair Remote;   Fair  Judgement:  Intact  Insight:  Lacking  Psychomotor Activity:  Increased  Concentration:  Concentration: Fair and Attention Span: Fair  Recall:  [employer] of Knowledge:  Fair  Language:  Fair  Akathisia:  Negative  Handed:  Right  AIMS (if indicated):     Assets:  Desire for Improvement Resilience  ADL's:  Intact  Cognition:  WNL  Sleep:  Number of Hours: 3.75     Treatment Plan Summary: Daily contact with patient to assess and evaluate symptoms and progress in treatment, Medication management and Plan  : Patient is seen and examined.  Patient is a 33 year old female with the above-stated past psychiatric history who is seen in follow-up.    Diagnosis: #1 chronic paranoid schizophrenia, #2 hypertension.  Patient is seen in follow-up.  She is doing much better today.  She still is somewhat paranoid, still little pensive, but at least not so outwardly irritable and angry.  No change in her Depakote or haloperidol currently.  Her blood pressure has remained elevated throughout the course of the hospitalization.  She currently is taking triamterene/hydrochlorothiazide-25 daily.  Her heart rate is in the 80s, so I will add metoprolol extended release 25 mg p.o. daily and titrate.  We will check chemistries tomorrow given the diuretics. 1.  Continue Depakote DR 250 mg p.o. 3 times daily for mood  stability. 2.  Continue haloperidol 10 mg p.o. 3 times daily for chronic paranoid schizophrenia. 3.  Continue PRN Haldol for agitation either IM or p.o. 4.  Continue hydroxyzine 25 mg p.o. 3 times daily as needed anxiety. 5.  Continue lorazepam 2 mg p.o. or IM every 6 hours as needed anxiety or agitation. 6.  Continue temazepam 45 mg p.o. nightly for insomnia. 7.  Continue trazodone 100 mg p.o. nightly as needed insomnia. 8.  Continue triamterene-hydrochlorothiazide 37.5-25 mg p.o. daily for hypertension. 9.  Continue Geodon 20 mg p.o. every 12 hours PRN agitation. 10.    Add metoprolol XL 25 mg p.o. daily and titrate for hypertension 11.  Depakote level, metabolic panel and CBC with differential in a.m. tomorrow 12.  Disposition planning-in progress.  Sharma Covert, MD 12/09/2018, 9:02 AM

## 2018-12-09 NOTE — Progress Notes (Signed)
Patient is currently awake talking to her sitter. She received snacks and juice before requesting motrin for gum pain. Writer encouraged her to take medication scheduled but she declined. 1:1 continues and patient is safe.

## 2018-12-09 NOTE — Progress Notes (Addendum)
Nursing Note 1:1  D: Patient resting in bed with eyes closed- appears to be sleeping- respirations even and unlabored.   A: Close observation continues for pt's safety R: Pr remains safe on the unit

## 2018-12-09 NOTE — [facility] Group Notes (Signed)
[facility] LCSW Group Therapy Note  Date/Time:  12/09/2018  11:00AM-12:00PM  Type of Therapy and Topic:  Group Therapy:  Music and Mood  Participation Level:  Active   Description of Group: In this process group, members listened to a variety of genres of music and identified that different types of music evoke different responses.  Patients were encouraged to identify music that was soothing for them and music that was energizing for them.  Patients discussed how this knowledge can help with wellness and recovery in various ways including managing depression and anxiety as well as encouraging healthy sleep habits.    Therapeutic Goals: 1. Patients will explore the impact of different varieties of music on mood 2. Patients will verbalize the thoughts they have when listening to different types of music 3. Patients will identify music that is soothing to them as well as music that is energizing to them 4. Patients will discuss how to use this knowledge to assist in maintaining wellness and recovery 5. Patients will explore the use of music as a coping skill  Summary of Patient Progress:  At the beginning of group, patient expressed that she felt "good."  She responded to internal stimuli for much of group, talking out loud to some entity.  She did show insight on two occasions in talking about songs and how they affected her emotions.  At the end of group she said she felt "good but stressed."  Therapeutic Modalities: Solution Focused Brief Therapy Activity   Selmer Dominion, LCSW

## 2018-12-09 NOTE — Progress Notes (Signed)
Nursing Note 1:1  D: Patient sitting in the dayroom quietly watching tv- no behavior issues noted this am- MHT within close proximity to pt A: Close observation continues for pt's safety R: Pt remains safe on the unit

## 2018-12-09 NOTE — Progress Notes (Signed)
  Nursing Note : Close Obs 1:1   D. Pt awake and alert sitting in room with MHT present - No behavioral issues noted. A. Close observation continues for pt's safety R. Pt remains safe on the unit

## 2018-12-09 NOTE — Progress Notes (Signed)
[place] NOVEL CORONAVIRUS (COVID-19) DAILY CHECK-OFF SYMPTOMS - answer yes or no to each - every day NO YES  Have you had a fever in the past 24 hours?  . Fever (Temp > 37.80C / 100F) X   Have you had any of these symptoms in the past 24 hours? . New Cough .  Sore Throat  .  Shortness of Breath .  Difficulty Breathing .  Unexplained Body Aches   X   Have you had any one of these symptoms in the past 24 hours not related to allergies?   . Runny Nose .  Nasal Congestion .  Sneezing   X   If you have had runny nose, nasal congestion, sneezing in the past 24 hours, has it worsened?  X   EXPOSURES - check yes or no X   Have you traveled outside the state in the past 14 days?  X   Have you been in contact with someone with a confirmed diagnosis of COVID-19 or PUI in the past 14 days without wearing appropriate PPE?  X   Have you been living in the same home as a person with confirmed diagnosis of COVID-19 or a PUI (household contact)?    X   Have you been diagnosed with COVID-19?    X              What to do next: Answered NO to all: Answered YES to anything:   Proceed with unit schedule Follow the [facility] Inpatient Flowsheet.   

## 2018-12-09 NOTE — Progress Notes (Addendum)
D. Pt continues to present as anxious and guarded- observed responding to internal stimuli while walking in the hall. Pt does appear calmer today- less disruptive and aggressive- observed attending group led by SW this am. Pt currently denies suicidal ideations A. Labs and vitals monitored. Pt compliant with medications. R. Pt remains safe with 15 minute checks and 1:1 close observation. Will continue POC.

## 2018-12-09 NOTE — Progress Notes (Signed)
Close Ob -  Patient is currently lying in bed asleep with covers over her head. Patient remains safe and sitter close by.

## 2018-12-10 MED ORDER — METOPROLOL SUCCINATE ER 50 MG PO TB24
50.0000 mg | ORAL_TABLET | Freq: Every day | ORAL | Status: DC
  Filled 2018-12-10 (×2): qty 7

## 2018-12-10 MED ORDER — HALOPERIDOL 10 MG PO TABS: ORAL_TABLET | ORAL | 2 refills | Status: DC

## 2018-12-10 MED ORDER — METOPROLOL SUCCINATE ER 50 MG PO TB24: 50.0000 mg | ORAL_TABLET | Freq: Every day | ORAL | 1 refills | Status: DC

## 2018-12-10 MED ORDER — HALOPERIDOL DECANOATE 100 MG/ML IM SOLN
100.0000 mg | INTRAMUSCULAR | Status: DC
  Administered 2018-12-10: 100 mg via INTRAMUSCULAR
  Filled 2018-12-10 (×2): qty 1

## 2018-12-10 MED ORDER — DIVALPROEX SODIUM 250 MG PO DR TAB
250.0000 mg | DELAYED_RELEASE_TABLET | Freq: Every day | ORAL | Status: DC
  Filled 2018-12-10 (×2): qty 21

## 2018-12-10 MED ORDER — HALOPERIDOL 5 MG PO TABS
10.0000 mg | ORAL_TABLET | Freq: Every day | ORAL | Status: DC
  Filled 2018-12-10 (×2): qty 42

## 2018-12-10 MED ORDER — BENZTROPINE MESYLATE 1 MG PO TABS
1.0000 mg | ORAL_TABLET | Freq: Two times a day (BID) | ORAL | Status: DC
  Filled 2018-12-10 (×3): qty 14

## 2018-12-10 MED ORDER — HALOPERIDOL DECANOATE 100 MG/ML IM SOLN: 100.0000 mg | INTRAMUSCULAR | 11 refills | Status: DC

## 2018-12-10 MED ORDER — BENZTROPINE MESYLATE 1 MG PO TABS: 1.0000 mg | ORAL_TABLET | Freq: Two times a day (BID) | ORAL | 2 refills | Status: DC

## 2018-12-10 MED ORDER — TEMAZEPAM 30 MG PO CAPS: 30.0000 mg | ORAL_CAPSULE | Freq: Every day | ORAL | 0 refills | Status: DC

## 2018-12-10 MED ORDER — DIVALPROEX SODIUM 250 MG PO DR TAB: DELAYED_RELEASE_TABLET | ORAL | 2 refills | Status: DC

## 2018-12-10 NOTE — [facility] Suicide Risk Assessment (Signed)
[facility] Discharge Suicide Risk Assessment   Principal Problem: Paranoid type delusional disorder [facility]) Discharge Diagnoses: Principal Problem:   Paranoid type delusional disorder ([place]) Active Problems:   Psychotic disorder ([place])   Total Time spent with patient: 45 minutes  Musculoskeletal: Strength & Muscle Tone: within normal limits Gait & Station: normal Patient leans: N/A  Psychiatric Specialty Exam: ROS  Blood pressure (!) 131/94, pulse 91, temperature 98 F (36.7 C), temperature source Oral, resp. rate 20, last menstrual period 11/20/2018.There is no height or weight on file to calculate BMI.  General Appearance: Casual  Eye Contact::  Good  Speech:  Clear and Coherent409  Volume:  Normal  Mood:  Euthymic  Affect:  Congruent  Thought Process:  Coherent  Orientation:  Full (Time, Place, and Person)  Thought Content:  Logical  Suicidal Thoughts:  No  Homicidal Thoughts:  No  Memory:  Immediate;   Fair  Judgement:  Intact  Insight:  Fair  Psychomotor Activity:  Normal  Concentration:  Good  Recall:  Good  Fund of Knowledge:Good  Language: Good  Akathisia:  Negative  Handed:  Right  AIMS (if indicated):     Assets:  Communication Skills Desire for Improvement Leisure Time Physical Health  Sleep:  Number of Hours: 6  Cognition: WNL  ADL's:  Intact   Mental Status Per Nursing Assessment::   On Admission:  NA  Demographic Factors:  Unemployed  Loss Factors: NA  Historical Factors: NA  Risk Reduction Factors:   Sense of responsibility to family and Religious beliefs about death  Continued Clinical Symptoms:  Schizophrenia:   Less than 2 years old  Cognitive Features That Contribute To Risk:  None    Suicide Risk:  Minimal: No identifiable suicidal ideation.  Patients presenting with no risk factors but with morbid ruminations; may be classified as minimal risk based on the severity of the depressive symptoms    Plan Of Care/Follow-up  recommendations:  Activity:  full  Anjenette Gerbino, MD 12/10/2018, 7:49 AM

## 2018-12-10 NOTE — Progress Notes (Signed)
Recreation Therapy Notes  INPATIENT RECREATION TR PLAN  Patient Details Name: Wendy Berry MRN: [id number] DOB: [birth year] Today's Date: 12/10/2018  Rec Therapy Plan Is patient appropriate for Therapeutic Recreation?: Yes Treatment times per week: about 3 days Estimated Length of Stay: 5-7 days TR Treatment/Interventions: Group participation (Comment)  Discharge Criteria Pt will be discharged from therapy if:: Discharged Treatment plan/goals/alternatives discussed and agreed upon by:: Patient/family  Discharge Summary Short term goals set: see patient care plan Short term goals met: Adequate for discharge Progress toward goals comments: Groups attended Which groups?: Leisure education Reason goals not met: n/a Therapeutic equipment acquired: none Reason patient discharged from therapy: Discharge from hospital Pt/family agrees with progress & goals achieved: Yes Date patient discharged from therapy: 12/10/18  Tomi Likens, LRT/CTRS  Dontea Corlew L Joeann Steppe 12/10/2018, 1:20 PM

## 2018-12-10 NOTE — Progress Notes (Signed)
Pt discharged to lobby. Pt was stable and appreciative at that time. All papers and prescriptions were given and valuables returned. Verbal understanding expressed. Denies SI/HI and A/VH. Pt given opportunity to express concerns and ask questions.  
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
## 2018-12-10 NOTE — Progress Notes (Signed)
  [name] :  Will you be returning to the same living situation after discharge:  Yes,  with mother At discharge, do you have transportation home?: Yes,  cab Do you have the ability to pay for your medications: Yes,  medicare  Release of information consent forms completed and in the chart;  Patient's signature needed at discharge.  Patient to Follow up at: Follow-up Information    [facility] Follow up on 12/14/2018.   Why:  Telephonic hospital follow up appointment is Friday, 6/12 at 2:30p.  The provider will contact you for appointment.  Contact information: [address] [place] [address] [phone]           Next level of care provider has access to [place] and Suicide Prevention discussed: No.; two attempts with pt's district attorney     Has patient been referred to the Quitline?: N/A patient is not a smoker  Patient has been referred for addiction treatment: No  Wendy Rogers, LCSW 12/10/2018, 10:44 AM

## 2018-12-10 NOTE — [facility] Suicide Risk Assessment (Addendum)
[place] INPATIENT:  Family/Significant Other Suicide Prevention Education  Suicide Prevention Education:  Contact Attempts: Zella Ball, district attorney, [phone], has been identified by the patient as the family member/significant other with whom the patient will be residing, and identified as the person(s) who will aid the patient in the event of a mental health crisis.  With written consent from the patient, two attempts were made to provide suicide prevention education, prior to and/or following the patient's discharge.  We were unsuccessful in providing suicide prevention education.  A suicide education pamphlet was given to the patient to share with family/significant other.  Date and time of first attempt:12/10/18, 0917 Date and time of second attempt: 12/10/2018, 1044  Joanne Chars, LCSW 12/10/2018, 9:17 AM

## 2018-12-10 NOTE — Progress Notes (Signed)
Writer observed patient lying in bed resting but now asleep. Writer informed her of shift change and being her nurse for the night. She was asked if needing anything and she replied no. Writer asked how she felt about her sitter not being her her room and if she replied that will be fine ma'am. Write received order for close observation to be discontinued. Patient remains isolative to her room in bed resting. Safety maintained on unit with 15 min checks.

## 2019-01-02 ENCOUNTER — Emergency Department (HOSPITAL_COMMUNITY)
Admission: EM | Admit: 2019-01-02 | Discharge: 2019-01-03 | Disposition: A | Discharge status: Home / Self Care | Payer: Medicare Other | Attending: Emergency Medicine | Admitting: Emergency Medicine

## 2019-01-02 DIAGNOSIS — Z79899 Other long term (current) drug therapy: Secondary | ICD-10-CM | POA: Insufficient documentation

## 2019-01-02 DIAGNOSIS — R079 Chest pain, unspecified: Secondary | ICD-10-CM | POA: Diagnosis present

## 2019-01-03 ENCOUNTER — Other Ambulatory Visit: Payer: Self-pay

## 2019-01-03 ENCOUNTER — Emergency Department (HOSPITAL_COMMUNITY): Payer: Medicare Other

## 2019-01-03 ENCOUNTER — Encounter (HOSPITAL_COMMUNITY): Payer: Self-pay | Admitting: Emergency Medicine

## 2019-01-03 DIAGNOSIS — R079 Chest pain, unspecified: Secondary | ICD-10-CM | POA: Diagnosis not present

## 2019-01-03 LAB — BASIC METABOLIC PANEL
Anion gap: 8 (ref 5–15)
BUN: 16 mg/dL (ref 6–20)
CO2: 24 mmol/L (ref 22–32)
Calcium: 9.6 mg/dL (ref 8.9–10.3)
Chloride: 106 mmol/L (ref 98–111)
Creatinine, Ser: 0.88 mg/dL (ref 0.44–1.00)
GFR calc Af Amer: >60 mL/min (ref >60)
GFR calc non Af Amer: >60 mL/min (ref >60)
Glucose, Bld: 90 mg/dL (ref 70–99)
Potassium: 4.1 mmol/L (ref 3.5–5.1)
Sodium: 138 mmol/L (ref 135–145)

## 2019-01-03 LAB — TROPONIN I (HIGH SENSITIVITY)
Troponin I (High Sensitivity): 4 ng/L (ref <18)
Troponin I (High Sensitivity): 4 ng/L (ref <18)

## 2019-01-03 LAB — CBC
HCT: 39.4 % (ref 36.0–46.0)
Hemoglobin: 12.5 g/dL (ref 12.0–15.0)
MCH: 28.2 pg (ref 26.0–34.0)
MCHC: 31.7 g/dL (ref 30.0–36.0)
MCV: 88.9 fL (ref 80.0–100.0)
Platelets: 367 10*3/uL (ref 150–400)
RBC: 4.43 MIL/uL (ref 3.87–5.11)
RDW: 13.6 % (ref 11.5–15.5)
WBC: 7.5 10*3/uL (ref 4.0–10.5)
nRBC: 0 % (ref 0.0–0.2)

## 2019-01-03 LAB — I-STAT BETA HCG BLOOD, ED (MC, WL, AP ONLY): I-stat hCG, quantitative: <5 m[IU]/mL (ref <5)

## 2019-01-03 MED ORDER — ACETAMINOPHEN 500 MG PO TABS
1000.0000 mg | ORAL_TABLET | Freq: Once | ORAL | Status: AC
  Administered 2019-01-03: 1000 mg via ORAL
  Filled 2019-01-03: qty 2

## 2019-01-03 MED ORDER — SODIUM CHLORIDE 0.9% FLUSH: 3.0000 mL | Freq: Once | INTRAVENOUS | Status: DC

## 2019-01-03 MED ORDER — ALUM & MAG HYDROXIDE-SIMETH 200-200-20 MG/5ML PO SUSP
15.0000 mL | Freq: Once | ORAL | Status: AC
  Administered 2019-01-03: 15 mL via ORAL
  Filled 2019-01-03: qty 30

## 2019-01-03 MED ORDER — PANTOPRAZOLE SODIUM 20 MG PO TBEC: 20.0000 mg | DELAYED_RELEASE_TABLET | Freq: Every day | ORAL | 0 refills | Status: DC

## 2019-01-03 MED ORDER — LIDOCAINE VISCOUS HCL 2 % MT SOLN
15.0000 mL | Freq: Once | OROMUCOSAL | Status: AC
  Administered 2019-01-03: 15 mL via OROMUCOSAL
  Filled 2019-01-03: qty 15

## 2019-01-03 NOTE — ED Triage Notes (Signed)
[facility] [facility], c/o of 10/10 chest pain, does not appear to be in any distress at this time.

## 2019-01-03 NOTE — ED Notes (Signed)
Patient verbalizes understanding of discharge instructions. Opportunity for questioning and answers were provided. Armband removed by staff, pt discharged from ED ambulatory.   

## 2019-01-03 NOTE — ED Provider Notes (Signed)
[facility] Provider Note   CSN: [id number] Arrival date & time: 01/02/19  2358     History   Chief Complaint Chief Complaint  Patient presents with  . Chest Pain    HPI Wendy Berry is a 33 y.o. female.      Chest Pain Pain location:  Substernal area and L chest Pain quality: aching, burning and sharp   Pain radiates to:  Does not radiate Pain severity:  Mild Onset quality:  Gradual Duration:  3 days Timing:  Intermittent Progression:  Improving Chronicity:  New Context: not breathing   Relieved by:  None tried Worsened by:  Nothing Ineffective treatments:  None tried Associated symptoms: no abdominal pain     Past Medical History:  Diagnosis Date  . Cerebral aneurysm   . Heart murmur   . Paranoid schizophrenia [facility])     Patient Active Problem List   Diagnosis Date Noted  . Psychotic disorder ([place]) 12/04/2018  . 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 BRAIN SURGERY       OB History   No obstetric history on file.      Home Medications    Prior to Admission medications   Medication Sig Start Date End Date Taking? Authorizing Provider  benztropine (COGENTIN) 1 MG tablet Take 1 tablet (1 mg total) by mouth 2 (two) times daily. 12/10/18 12/10/19  [name], MD  divalproex (DEPAKOTE) 250 MG DR tablet 1 in am 2 at hs 12/10/18   [name], MD  haloperidol (HALDOL) 10 MG tablet 1 in am 2 at h s 12/10/18   [name], MD  haloperidol decanoate (HALDOL DECANOATE) 100 MG/ML injection Inject 1 mL (100 mg total) into the muscle every 30 (thirty) days. Due 7/8 12/10/18   [name], MD  metoprolol succinate (TOPROL-XL) 50 MG 24 hr tablet Take 1 tablet (50 mg total) by mouth daily. 12/10/18   [name], MD  pantoprazole (PROTONIX) 20 MG tablet Take 1 tablet (20 mg total) by mouth daily. 01/03/19   Avie Checo, Corene Cornea, MD  temazepam (RESTORIL) 30 MG capsule  Take 1 capsule (30 mg total) by mouth at bedtime. 12/10/18   [name], MD  triamterene-hydrochlorothiazide (MAXZIDE-25) 37.5-25 MG tablet Take 1 tablet by mouth daily. Patient not taking: Reported on 05/09/2017 06/02/16   Robyn Haber, MD    Family History No family history on file.  Social History Social History   Tobacco Use  . Smoking status: Never Smoker  . Smokeless tobacco: Never Used  Substance Use Topics  . Alcohol use: No  . Drug use: No     Allergies   Patient has no known allergies.   Review of Systems Review of Systems  Cardiovascular: Positive for chest pain.  Gastrointestinal: Negative for abdominal pain.  All other systems reviewed and are negative.    Physical Exam Updated Vital Signs BP (!) 148/98 (BP Location: Right Arm)   Pulse 76   Temp 97.7 F (36.5 C) (Oral)   Resp 16   SpO2 100%   Physical Exam Vitals signs and nursing note reviewed.  Constitutional:      Appearance: She is well-developed.  HENT:     Head: Normocephalic and atraumatic.     Mouth/Throat:     Pharynx: Oropharynx is clear.  Eyes:     Conjunctiva/sclera: Conjunctivae normal.  Neck:     Musculoskeletal: Normal range of motion.  Cardiovascular:  Rate and Rhythm: Normal rate and regular rhythm.  Pulmonary:     Effort: No respiratory distress.     Breath sounds: Normal breath sounds. No stridor.  Abdominal:     General: Bowel sounds are normal. There is no distension.     Tenderness: There is no abdominal tenderness.  Musculoskeletal: Normal range of motion.     Right lower leg: No edema.     Left lower leg: No edema.  Skin:    General: Skin is warm and dry.  Neurological:     General: No focal deficit present.     Mental Status: She is alert.      ED Treatments / Results  Labs (all labs ordered are listed, but only abnormal results are displayed) Labs Reviewed  BASIC METABOLIC PANEL  CBC  TROPONIN I (HIGH SENSITIVITY)  TROPONIN I (HIGH  SENSITIVITY)  I-STAT BETA HCG BLOOD, ED ([facility], [facility], [facility] ONLY)    EKG EKG Interpretation  Date/Time:  Thursday January 03 2019 00:06:21 EDT Ventricular Rate:  89 PR Interval:  186 QRS Duration: 80 QT Interval:  362 QTC Calculation: 440 R Axis:   2 Text Interpretation:  Normal sinus rhythm Left ventricular hypertrophy Abnormal ECG change in amplitude and direction in multiple leads since june Confirmed by Marily MemosMesner, Stephana Morell [phone]) on 01/03/2019 1:03:32 AM   Radiology Dg Chest 2 View  Result Date: 01/03/2019 CLINICAL DATA:  Chest pain EXAM: CHEST - 2 VIEW COMPARISON:  09/11/2018 FINDINGS: Low lung volumes. No consolidation or effusion. Borderline to mild cardiomegaly. No pneumothorax. IMPRESSION: No active cardiopulmonary disease. Low lung volumes with borderline to mild cardiomegaly Electronically Signed   By: Jasmine PangKim  Fujinaga M.D.   On: 01/03/2019 00:38    Procedures Procedures (including critical care time)  Medications Ordered in ED Medications  sodium chloride flush (NS) 0.9 % injection 3 mL (has no administration in time range)  lidocaine (XYLOCAINE) 2 % viscous mouth solution 15 mL (15 mLs Mouth/Throat Given 01/03/19 0231)  acetaminophen (TYLENOL) tablet 1,000 mg (1,000 mg Oral Given 01/03/19 0231)  alum & mag hydroxide-simeth (MAALOX/MYLANTA) 200-200-20 MG/5ML suspension 15 mL (15 mLs Oral Given 01/03/19 0230)     Initial Impression / Assessment and Plan / ED Course  I have reviewed the triage vital signs and the nursing notes.  Pertinent labs & imaging results that were available during my care of the patient were reviewed by me and considered in my medical decision making (see chart for details).        Nonspecific CP. No e/o infectious causes. No e/o ischemic causes. Doubt PE. No PTX. Heartburn? Anxiety? Outpatient follow up.  Final Clinical Impressions(s) / ED Diagnoses   Final diagnoses:  Nonspecific chest pain    ED Discharge Orders         Ordered    pantoprazole (PROTONIX)  20 MG tablet  Daily     01/03/19 0321           Keidrick Murty, Barbara CowerJason, MD 01/03/19 [phone]

## 2019-10-09 ENCOUNTER — Inpatient Hospital Stay (HOSPITAL_COMMUNITY)
Admission: AD | Admit: 2019-10-09 | Discharge: 2019-10-21 | DRG: 885 | Disposition: A | Discharge status: Home / Self Care | Payer: Medicare Other | Attending: Psychiatry | Admitting: Psychiatry

## 2019-10-09 ENCOUNTER — Encounter (HOSPITAL_COMMUNITY): Payer: Self-pay | Admitting: Psychiatric/Mental Health

## 2019-10-09 ENCOUNTER — Other Ambulatory Visit: Payer: Self-pay

## 2019-10-09 DIAGNOSIS — F209 Schizophrenia, unspecified: Secondary | ICD-10-CM | POA: Diagnosis present

## 2019-10-09 DIAGNOSIS — Z87820 Personal history of traumatic brain injury: Secondary | ICD-10-CM

## 2019-10-09 DIAGNOSIS — Z9114 Patient's other noncompliance with medication regimen: Secondary | ICD-10-CM

## 2019-10-09 DIAGNOSIS — Z20822 Contact with and (suspected) exposure to covid-19: Secondary | ICD-10-CM | POA: Diagnosis present

## 2019-10-09 DIAGNOSIS — I10 Essential (primary) hypertension: Secondary | ICD-10-CM | POA: Diagnosis present

## 2019-10-09 DIAGNOSIS — N390 Urinary tract infection, site not specified: Secondary | ICD-10-CM | POA: Diagnosis present

## 2019-10-09 DIAGNOSIS — G4700 Insomnia, unspecified: Secondary | ICD-10-CM | POA: Diagnosis present

## 2019-10-09 DIAGNOSIS — F200 Paranoid schizophrenia: Principal | ICD-10-CM | POA: Diagnosis present

## 2019-10-09 LAB — RESPIRATORY PANEL BY RT PCR (FLU A&B, COVID)
Influenza A by PCR: NEGATIVE
Influenza B by PCR: NEGATIVE
SARS Coronavirus 2 by RT PCR: NEGATIVE

## 2019-10-09 MED ORDER — TRAZODONE HCL 50 MG PO TABS
50.0000 mg | ORAL_TABLET | Freq: Every evening | ORAL | Status: DC | PRN
  Administered 2019-10-11 – 2019-10-19 (×3): 50 mg via ORAL
  Filled 2019-10-09 (×4): qty 1

## 2019-10-09 MED ORDER — OLANZAPINE 10 MG PO TBDP
10.0000 mg | ORAL_TABLET | Freq: Three times a day (TID) | ORAL | Status: DC | PRN
  Administered 2019-10-11: 06:00:00 10 mg via ORAL
  Filled 2019-10-09: qty 1

## 2019-10-09 MED ORDER — HYDROXYZINE HCL 25 MG PO TABS
25.0000 mg | ORAL_TABLET | Freq: Three times a day (TID) | ORAL | Status: DC | PRN
  Administered 2019-10-14 – 2019-10-21 (×6): 25 mg via ORAL
  Filled 2019-10-09 (×8): qty 1

## 2019-10-09 MED ORDER — ZIPRASIDONE MESYLATE 20 MG IM SOLR
20.0000 mg | Freq: Four times a day (QID) | INTRAMUSCULAR | Status: DC | PRN
  Administered 2019-10-14: 20 mg via INTRAMUSCULAR
  Filled 2019-10-09: qty 20

## 2019-10-09 MED ORDER — ZIPRASIDONE MESYLATE 20 MG IM SOLR
20.0000 mg | Freq: Once | INTRAMUSCULAR | Status: DC
  Filled 2019-10-09: qty 20

## 2019-10-09 MED ORDER — LORAZEPAM 2 MG/ML IJ SOLN
1.0000 mg | Freq: Once | INTRAMUSCULAR | Status: AC
  Administered 2019-10-09: 1 mg via INTRAVENOUS
  Filled 2019-10-09: qty 0.5

## 2019-10-09 MED ORDER — MAGNESIUM HYDROXIDE 400 MG/5ML PO SUSP: 30.0000 mL | Freq: Every day | ORAL | Status: DC | PRN

## 2019-10-09 MED ORDER — ALUM & MAG HYDROXIDE-SIMETH 200-200-20 MG/5ML PO SUSP: 30.0000 mL | ORAL | Status: DC | PRN

## 2019-10-09 MED ORDER — LORAZEPAM 1 MG PO TABS
2.0000 mg | ORAL_TABLET | Freq: Four times a day (QID) | ORAL | Status: DC | PRN
  Administered 2019-10-10 – 2019-10-21 (×14): 2 mg via ORAL
  Filled 2019-10-09: qty 1
  Filled 2019-10-09 (×12): qty 2
  Filled 2019-10-09: qty 4
  Filled 2019-10-09 (×3): qty 2

## 2019-10-09 MED ORDER — LORAZEPAM 2 MG/ML IJ SOLN
2.0000 mg | Freq: Four times a day (QID) | INTRAMUSCULAR | Status: DC | PRN
  Filled 2019-10-09: qty 1

## 2019-10-09 MED ORDER — ZIPRASIDONE MESYLATE 20 MG IM SOLR
20.0000 mg | INTRAMUSCULAR | Status: AC | PRN
  Administered 2019-10-09: 17:00:00 20 mg via INTRAMUSCULAR

## 2019-10-09 MED ORDER — ACETAMINOPHEN 325 MG PO TABS
650.0000 mg | ORAL_TABLET | Freq: Four times a day (QID) | ORAL | Status: DC | PRN
  Administered 2019-10-10 – 2019-10-19 (×15): 650 mg via ORAL
  Filled 2019-10-09 (×15): qty 2

## 2019-10-09 NOTE — Plan of Care (Addendum)
[facility] Observation Crisis Plan  Reason for Crisis Plan:  Chronic Mental Illness/Medical Illness, Crisis Stabilization and Medication Management   Plan of Care:  Referral for Inpatient Hospitalization and Referral for Telepsychiatry/Psychiatric Consult  Family Support:      Current Living Environment:  Living Arrangements: Parent  Insurance:   Hospital Account    Name Acct ID Class Status Primary Coverage   [facility], [facility] [id number] BEHAVIORAL HEALTH OBSERVATION Open MEDICARE - MEDICARE PART A AND B        Guarantor Account (for Hospital Account [id number])    Name Relation to Pt Service Area Active? Acct Type   [facility]end[facility] Berry Self [facility] Yes Behavioral Health   Address Phone       [address] [place], [place] [address] [phone](H)          Coverage Information (for Hospital Account [id number])    1. MEDICARE/MEDICARE PART A AND B    F/O Payor/Plan Precert #   MEDICARE/MEDICARE PART A AND B    Subscriber Subscriber #   Wendy Berry, Wendy Berry [id number]   Address Phone   [address] [place], [place] [address]        2. [place]    F/O Payor/Plan Precert #   [facility] [place]ICAID/[place]    Subscriber Subscriber #   Wendy Berry, Wendy Berry [id number]   Address Phone   [address] [place], [place] [address] [phone]          Legal Guardian:  Legal Guardian: Other:  Primary Care Provider:  Patient, No Pcp Per  Current Outpatient Providers:  ACT  Psychiatrist:  Name of Psychiatrist: Dr. Jeannine Kitten  Counselor/Therapist:  Name of Therapist: [facility]  Compliant with Medications:  No  Additional Information:   [name] 4/7/20217:34 PM
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
## 2019-10-09 NOTE — Progress Notes (Signed)
Patient ID: Wendy Berry, female   DOB: 10-Dec-1985, 34 y.o.   MRN: [id number] Pt calm & resting at present, no distress noted, pt sedated.  Skin color good, resp. Even and unlabored.  Pt is IVC, will continue to monitor.

## 2019-10-10 DIAGNOSIS — Z9114 Patient's other noncompliance with medication regimen: Secondary | ICD-10-CM | POA: Diagnosis not present

## 2019-10-10 DIAGNOSIS — Z20822 Contact with and (suspected) exposure to covid-19: Secondary | ICD-10-CM | POA: Diagnosis present

## 2019-10-10 DIAGNOSIS — G4700 Insomnia, unspecified: Secondary | ICD-10-CM | POA: Diagnosis present

## 2019-10-10 DIAGNOSIS — F200 Paranoid schizophrenia: Secondary | ICD-10-CM | POA: Diagnosis not present

## 2019-10-10 DIAGNOSIS — I10 Essential (primary) hypertension: Secondary | ICD-10-CM | POA: Diagnosis present

## 2019-10-10 DIAGNOSIS — Z87820 Personal history of traumatic brain injury: Secondary | ICD-10-CM | POA: Diagnosis not present

## 2019-10-10 DIAGNOSIS — N390 Urinary tract infection, site not specified: Secondary | ICD-10-CM | POA: Diagnosis present

## 2019-10-10 LAB — COMPREHENSIVE METABOLIC PANEL
ALT: 18 U/L (ref 0–44)
AST: 18 U/L (ref 15–41)
Albumin: 3.4 g/dL — ABNORMAL LOW (ref 3.5–5.0)
Alkaline Phosphatase: 83 U/L (ref 38–126)
Anion gap: 9 (ref 5–15)
BUN: 14 mg/dL (ref 6–20)
CO2: 24 mmol/L (ref 22–32)
Calcium: 9 mg/dL (ref 8.9–10.3)
Chloride: 106 mmol/L (ref 98–111)
Creatinine, Ser: 0.74 mg/dL (ref 0.44–1.00)
GFR calc Af Amer: >60 mL/min (ref >60)
GFR calc non Af Amer: >60 mL/min (ref >60)
Glucose, Bld: 95 mg/dL (ref 70–99)
Potassium: 3.9 mmol/L (ref 3.5–5.1)
Sodium: 139 mmol/L (ref 135–145)
Total Bilirubin: 0.9 mg/dL (ref 0.3–1.2)
Total Protein: 8.4 g/dL — ABNORMAL HIGH (ref 6.5–8.1)

## 2019-10-10 LAB — CBC
HCT: 42.7 % (ref 36.0–46.0)
Hemoglobin: 13.3 g/dL (ref 12.0–15.0)
MCH: 27.4 pg (ref 26.0–34.0)
MCHC: 31.1 g/dL (ref 30.0–36.0)
MCV: 87.9 fL (ref 80.0–100.0)
Platelets: 443 10*3/uL — ABNORMAL HIGH (ref 150–400)
RBC: 4.86 MIL/uL (ref 3.87–5.11)
RDW: 14 % (ref 11.5–15.5)
WBC: 7.6 10*3/uL (ref 4.0–10.5)
nRBC: 0 % (ref 0.0–0.2)

## 2019-10-10 LAB — RAPID URINE DRUG SCREEN, HOSP PERFORMED
Amphetamines: NOT DETECTED
Barbiturates: NOT DETECTED
Benzodiazepines: POSITIVE — AB
Cocaine: NOT DETECTED
Opiates: NOT DETECTED
Tetrahydrocannabinol: NOT DETECTED

## 2019-10-10 LAB — LIPID PANEL
Cholesterol: 255 mg/dL — ABNORMAL HIGH (ref 0–200)
HDL: 51 mg/dL (ref >40)
LDL Cholesterol: 182 mg/dL — ABNORMAL HIGH (ref 0–99)
Total CHOL/HDL Ratio: 5 RATIO
Triglycerides: 111 mg/dL (ref <150)
VLDL: 22 mg/dL (ref 0–40)

## 2019-10-10 LAB — TSH: TSH: 1.013 u[IU]/mL (ref 0.350–4.500)

## 2019-10-10 LAB — URINALYSIS, ROUTINE W REFLEX MICROSCOPIC
Bilirubin Urine: NEGATIVE
Glucose, UA: NEGATIVE mg/dL
Hgb urine dipstick: NEGATIVE
Ketones, ur: NEGATIVE mg/dL
Nitrite: NEGATIVE
Protein, ur: 30 mg/dL — AB
Specific Gravity, Urine: 1.03 (ref 1.005–1.030)
pH: 6 (ref 5.0–8.0)

## 2019-10-10 LAB — PREGNANCY, URINE: Preg Test, Ur: NEGATIVE

## 2019-10-10 LAB — ETHANOL: Alcohol, Ethyl (B): <10 mg/dL (ref <10)

## 2019-10-10 MED ORDER — TEMAZEPAM 30 MG PO CAPS
30.0000 mg | ORAL_CAPSULE | Freq: Every day | ORAL | Status: DC
  Administered 2019-10-11 – 2019-10-19 (×6): 30 mg via ORAL
  Filled 2019-10-10 (×10): qty 1

## 2019-10-10 MED ORDER — CLONAZEPAM 1 MG PO TABS: 2.0000 mg | ORAL_TABLET | Freq: Once | ORAL | Status: DC

## 2019-10-10 MED ORDER — LORATADINE 10 MG PO TABS
10.0000 mg | ORAL_TABLET | Freq: Every day | ORAL | Status: DC | PRN
  Administered 2019-10-10 – 2019-10-19 (×4): 10 mg via ORAL
  Filled 2019-10-10 (×4): qty 1

## 2019-10-10 MED ORDER — BENZTROPINE MESYLATE 1 MG PO TABS
1.0000 mg | ORAL_TABLET | Freq: Two times a day (BID) | ORAL | Status: DC
  Administered 2019-10-10 – 2019-10-21 (×20): 1 mg via ORAL
  Filled 2019-10-10 (×10): qty 1
  Filled 2019-10-10 (×2): qty 14
  Filled 2019-10-10 (×16): qty 1

## 2019-10-10 MED ORDER — RISPERIDONE 3 MG PO TABS
3.0000 mg | ORAL_TABLET | Freq: Two times a day (BID) | ORAL | Status: DC
  Administered 2019-10-10 – 2019-10-11 (×3): 3 mg via ORAL
  Filled 2019-10-10 (×4): qty 1
  Filled 2019-10-10: qty 3
  Filled 2019-10-10 (×3): qty 1

## 2019-10-10 MED ORDER — DIVALPROEX SODIUM 250 MG PO DR TAB
250.0000 mg | DELAYED_RELEASE_TABLET | Freq: Two times a day (BID) | ORAL | Status: DC
  Administered 2019-10-10 – 2019-10-12 (×4): 250 mg via ORAL
  Filled 2019-10-10 (×4): qty 1
  Filled 2019-10-10: qty 2
  Filled 2019-10-10 (×3): qty 1

## 2019-10-10 NOTE — Progress Notes (Signed)
D:  Patient presents with manic behaviors, increased agitation and anxiety. Patient hallucinating presently, arguing with self. Speaks of a person named Tiffany, communicating threatsstating, "Tiffany, you better stay away from me because I will cut you bitch". When Clinical research associate ask patient who is Elmarie Shiley, she responds, "this bitch that won't stay out of my business". Speech in slurred and muffled in tone. Easily agitated and make repeated racial remarks about "whites, African-Americans and Chinese people. She reports "good" appetite, "good" sleep , and denies any physical complaints when asked. Demanding to leave facility. Unable to be reoriented to situation. Received Ativan po for increased anxiety. Loose associations, repetitive actions (spell out names of people she is talking about). Unable to hold intelligible conversation due to loose associations. Paranoid of others. Pt states, "I don't trust white people". She denies intent to harm self when asked.    A: Support and encouragement provided. Routine safety checks conducted every 15 minutes per unit protocol. Pt to be moved to [address] when bed becomes available. Encouraged to notify if thoughts of harm toward self or others arise.   R: Patient remains safe at this time, patient verbally contracts for safety at this time. Will continue to monitor.

## 2019-10-10 NOTE — BH Assessment (Signed)
[facility] Assessment Progress Note  Per Malvin Johns, MD, this pt requires psychiatric hospitalization.  Jasmine has assigned pt to [facility] Rm 504-1.  Pt presents under IVC initiated by pt's community mental health provider, and upheld by Dr Jeannine Kitten.   IVC documents may be found on pt's chart.  Pt's nurse has been notified.   Doylene Canning, [place] Behavioral Health Coordinator [phone]

## 2019-10-10 NOTE — Progress Notes (Signed)
Patient ID: Wendy Berry, female   DOB: Apr 15, 1986, 34 y.o.   MRN: [id number] Patient agitated, belligerent, rambling with flight of ideas, extremely delusional, states that "white people are stalking me. [place], they're KKK, was yelling at me at [facility]". Pt spelt this name out, and when this RN approached her for medications, she stated"You African get away from me. Go take a bath. Africans stink", and continued to ramble on and on.  Pt cheeked the Ativan 2mg  PO initially, and became very agitated when RN told her to stick her tongue out so it could be checked if medication was swallowed. One of the pills seen under her tongue, and RN asked patient numerous times to open her mouth and stick tongue out, but she only slightly opened her mouth and did not stick tongue out.  Pt currently denies SI/AVH/HI. Pt currently pending bed assignment for transfer out of Obs.

## 2019-10-10 NOTE — [facility] Suicide Risk Assessment (Signed)
[facility] Admission Suicide Risk Assessment   Nursing information obtained from:  Patient Demographic factors:  NA Current Mental Status:  Self-harm behaviors, Thoughts of violence towards others Loss Factors:  NA Historical Factors:  Impulsivity Risk Reduction Factors:  Positive therapeutic relationship  Total Time spent with patient: 45 minutes Principal Problem: <principal problem not specified> Diagnosis:  Active Problems:   Schizophrenia (HCC)  Subjective Data: see eval  Continued Clinical Symptoms:    The "Alcohol Use Disorders Identification Test", Guidelines for Use in Primary Care, Second Edition.  World Science writer [facility]). Score between 0-7:  no or low risk or alcohol related problems. Score between 8-15:  moderate risk of alcohol related problems. Score between 16-19:  high risk of alcohol related problems. Score 20 or above:  warrants further diagnostic evaluation for alcohol dependence and treatment.   CLINICAL FACTORS:   Schizophrenia:   Less than 22 years old Musculoskeletal: Strength & Muscle Tone: within normal limits Gait & Station: normal Patient leans: N/A  Psychiatric Specialty Exam: Physical Exam  Nursing note and vitals reviewed. Constitutional: She appears well-developed and well-nourished.    Review of Systems  Constitutional: Negative.   Eyes: Negative.   Respiratory: Negative.   Cardiovascular: Negative.   Gastrointestinal: Negative.   Endocrine: Negative.   Genitourinary: Negative.   Musculoskeletal: Negative.     Blood pressure 103/75, pulse 94, temperature 98.1 F (36.7 C), temperature source Oral, resp. rate 18, SpO2 98 %.There is no height or weight on file to calculate BMI.  General Appearance: Disheveled  Eye Contact:  Fair  Speech:  Pressured  Volume:  Increased  Mood:  Irritable and manic  Affect:  Full Range  Thought Process:  Descriptions of Associations: Loose  Orientation:  Full (Time, Place, and Person)  Thought  Content:  Delusions  Suicidal Thoughts:  No  Homicidal Thoughts:  Yes.  without intent/plan  Memory:  Immediate;   Poor Recent;   Fair Remote;   Fair  Judgement:  Poor  Insight:  Lacking  Psychomotor Activity:  Normal  Concentration:  Concentration: Fair and Attention Span: Fair  Recall:  [employer] of Knowledge:  Good  Language:  Fair  Akathisia:  Negative  Handed:  Right  AIMS (if indicated):     Assets:  Communication Skills Desire for Improvement Physical Health Resilience  ADL's:  Intact  Cognition:  WNL  Sleep:         COGNITIVE FEATURES THAT CONTRIBUTE TO RISK:  Loss of executive function    SUICIDE RISK:   Mild:  Suicidal ideation of limited frequency, intensity, duration, and specificity.  There are no identifiable plans, no associated intent, mild dysphoria and related symptoms, good self-control (both objective and subjective assessment), few other risk factors, and identifiable protective factors, including available and accessible social support.  PLAN OF CARE: admit  I certify that inpatient services furnished can reasonably be expected to improve the patient's condition.   Malvin Johns, MD 10/10/2019, 10:39 AM
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
## 2019-10-10 NOTE — Progress Notes (Signed)
[facility] NOVEL CORONAVIRUS (COVID-19) DAILY CHECK-OFF SYMPTOMS - answer yes or no to each - every day NO YES  Have you had a fever in the past 24 hours?  . Fever (Temp > 37.80C / 100F) X   Have you had any of these symptoms in the past 24 hours? . New Cough .  Sore Throat  .  Shortness of Breath .  Difficulty Breathing .  Unexplained Body Aches   X   Have you had any one of these symptoms in the past 24 hours not related to allergies?   . Runny Nose .  Nasal Congestion .  Sneezing   X   If you have had runny nose, nasal congestion, sneezing in the past 24 hours, has it worsened?  X   EXPOSURES - check yes or no X   Have you traveled outside the state in the past 14 days?  X   Have you been in contact with someone with a confirmed diagnosis of COVID-19 or PUI in the past 14 days without wearing appropriate PPE?  X   Have you been living in the same home as a person with confirmed diagnosis of COVID-19 or a PUI (household contact)?    X   Have you been diagnosed with COVID-19?    X              What to do next: Answered NO to all: Answered YES to anything:   Proceed with unit schedule Follow the [facility] Inpatient Flowsheet.   

## 2019-10-11 LAB — HEMOGLOBIN A1C
Hgb A1c MFr Bld: 5 % (ref 4.8–5.6)
Mean Plasma Glucose: 97 mg/dL

## 2019-10-11 MED ORDER — PROPRANOLOL HCL 20 MG PO TABS
20.0000 mg | ORAL_TABLET | Freq: Two times a day (BID) | ORAL | Status: DC
  Administered 2019-10-11 – 2019-10-12 (×3): 20 mg via ORAL
  Filled 2019-10-11 (×7): qty 1

## 2019-10-11 MED ORDER — RISPERIDONE 2 MG PO TABS
4.0000 mg | ORAL_TABLET | Freq: Two times a day (BID) | ORAL | Status: DC
  Administered 2019-10-11 – 2019-10-17 (×10): 4 mg via ORAL
  Filled 2019-10-11 (×14): qty 2

## 2019-10-11 MED ORDER — CLONAZEPAM 1 MG PO TABS
2.0000 mg | ORAL_TABLET | Freq: Once | ORAL | Status: AC
  Administered 2019-10-11: 2 mg via ORAL
  Filled 2019-10-11: qty 2
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
## 2019-10-11 NOTE — [facility] Group Notes (Signed)
LCSW Wellness Group Note   10/11/2019 11:00am  Type of Group and Topic: Psychoeducational Group:  Wellness  Participation Level:  Did not attend  Description of Group  Wellness group introduces the topic and its focus on developing healthy habits across the spectrum and its relationship to a decrease in hospital admissions.  Six areas of wellness are discussed: physical, social spiritual, intellectual, occupational, and emotional.  Patients are asked to consider their current wellness habits and to identify areas of wellness where they are interested and able to focus on improvements.    Therapeutic Goals 1. Patients will understand components of wellness and how they can positively impact overall health.  2. Patients will identify areas of wellness where they have developed good habits. 3. Patients will identify areas of wellness where they would like to make improvements.    Summary of Patient Progress     Therapeutic Modalities: Cognitive Behavioral Therapy Psychoeducation    Leara Rawl Jon, LCSW   

## 2019-10-11 NOTE — Tx Team (Signed)
Interdisciplinary Treatment and Diagnostic Plan Update  10/11/2019 Time of Session: 8:35am  Wendy Berry MRN: [id number]  Principal Diagnosis: <principal problem not specified>  Secondary Diagnoses: Active Problems:   Schizophrenia ([place])   Current Medications:  Current Facility-Administered Medications  Medication Dose Route Frequency Provider Last Rate Last Admin  . acetaminophen (TYLENOL) tablet 650 mg  650 mg Oral Q6H PRN Connye Burkitt, NP   650 mg at 10/11/19 7262  . alum & mag hydroxide-simeth (MAALOX/MYLANTA) 200-200-20 MG/5ML suspension 30 mL  30 mL Oral Q4H PRN Connye Burkitt, NP      . benztropine (COGENTIN) tablet 1 mg  1 mg Oral BID [name], MD   1 mg at 10/11/19 0355  . clonazePAM (KLONOPIN) tablet 2 mg  2 mg Oral Once [name], MD   Stopped at 10/10/19 1247  . divalproex (DEPAKOTE) DR tablet 250 mg  250 mg Oral Q12H [name], MD   250 mg at 10/11/19 9741  . hydrOXYzine (ATARAX/VISTARIL) tablet 25 mg  25 mg Oral TID PRN Connye Burkitt, NP      . loratadine (CLARITIN) tablet 10 mg  10 mg Oral Daily PRN Mordecai Maes, NP   10 mg at 10/10/19 1717  . LORazepam (ATIVAN) tablet 2 mg  2 mg Oral Q6H PRN Connye Burkitt, NP   2 mg at 10/11/19 6384   Or  . LORazepam (ATIVAN) injection 2 mg  2 mg Intramuscular Q6H PRN Connye Burkitt, NP      . magnesium hydroxide (MILK OF MAGNESIA) suspension 30 mL  30 mL Oral Daily PRN Connye Burkitt, NP      . OLANZapine zydis (ZYPREXA) disintegrating tablet 10 mg  10 mg Oral Q8H PRN Connye Burkitt, NP   10 mg at 10/11/19 0542  . risperiDONE (RISPERDAL) tablet 3 mg  3 mg Oral BID [name], MD   3 mg at 10/11/19 5364  . temazepam (RESTORIL) capsule 30 mg  30 mg Oral QHS [name], MD      . traZODone (DESYREL) tablet 50 mg  50 mg Oral QHS PRN Connye Burkitt, NP      . ziprasidone (GEODON) injection 20 mg  20 mg Intramuscular Q6H PRN Connye Burkitt, NP       PTA Medications: Medications Prior to Admission  Medication Sig  Dispense Refill Last Dose  . benztropine (COGENTIN) 1 MG tablet Take 1 tablet (1 mg total) by mouth 2 (two) times daily. (Patient not taking: Reported on 10/10/2019) 60 tablet 2 Not Taking at Unknown time  . divalproex (DEPAKOTE) 250 MG DR tablet 1 in am 2 at hs (Patient not taking: Reported on 10/10/2019) 90 tablet 2 Not Taking at Unknown time  . haloperidol (HALDOL) 10 MG tablet 1 in am 2 at h s (Patient not taking: Reported on 10/10/2019) 90 tablet 2 Not Taking at Unknown time  . haloperidol decanoate (HALDOL DECANOATE) 100 MG/ML injection Inject 1 mL (100 mg total) into the muscle every 30 (thirty) days. Due 7/8 (Patient not taking: Reported on 10/10/2019) 1 mL 11 Not Taking at Unknown time  . metoprolol succinate (TOPROL-XL) 50 MG 24 hr tablet Take 1 tablet (50 mg total) by mouth daily. (Patient not taking: Reported on 10/10/2019) 90 tablet 1 Not Taking at Unknown time  . pantoprazole (PROTONIX) 20 MG tablet Take 1 tablet (20 mg total) by mouth daily. (Patient not taking: Reported on 10/10/2019) 15 tablet 0 Not Taking at Unknown time  .  temazepam (RESTORIL) 30 MG capsule Take 1 capsule (30 mg total) by mouth at bedtime. (Patient not taking: Reported on 10/10/2019) 30 capsule 0 Not Taking at Unknown time  . triamterene-hydrochlorothiazide (MAXZIDE-25) 37.5-25 MG tablet Take 1 tablet by mouth daily. (Patient not taking: Reported on 10/10/2019) 30 tablet 11 Not Taking at Unknown time    Patient Stressors: Health problems Traumatic event  Patient Strengths: Supportive family/friends  Treatment Modalities: Medication Management, Group therapy, Case management,  1 to 1 session with clinician, Psychoeducation, Recreational therapy.   Physician Treatment Plan for Primary Diagnosis: <principal problem not specified> Long Term Goal(s): Improvement in symptoms so as ready for discharge Improvement in symptoms so as ready for discharge   Short Term Goals: Ability to identify changes in lifestyle to reduce  recurrence of condition will improve Ability to verbalize feelings will improve Ability to demonstrate self-control will improve Ability to identify and develop effective coping behaviors will improve Ability to maintain clinical measurements within normal limits will improve Compliance with prescribed medications will improve Ability to identify triggers associated with substance abuse/mental health issues will improve Ability to identify changes in lifestyle to reduce recurrence of condition will improve Ability to verbalize feelings will improve Ability to disclose and discuss suicidal ideas Ability to demonstrate self-control will improve  Medication Management: Evaluate patient's response, side effects, and tolerance of medication regimen.  Therapeutic Interventions: 1 to 1 sessions, Unit Group sessions and Medication administration.  Evaluation of Outcomes: Not Met  Physician Treatment Plan for Secondary Diagnosis: Active Problems:   Schizophrenia ([place])  Long Term Goal(s): Improvement in symptoms so as ready for discharge Improvement in symptoms so as ready for discharge   Short Term Goals: Ability to identify changes in lifestyle to reduce recurrence of condition will improve Ability to verbalize feelings will improve Ability to demonstrate self-control will improve Ability to identify and develop effective coping behaviors will improve Ability to maintain clinical measurements within normal limits will improve Compliance with prescribed medications will improve Ability to identify triggers associated with substance abuse/mental health issues will improve Ability to identify changes in lifestyle to reduce recurrence of condition will improve Ability to verbalize feelings will improve Ability to disclose and discuss suicidal ideas Ability to demonstrate self-control will improve     Medication Management: Evaluate patient's response, side effects, and tolerance of medication  regimen.  Therapeutic Interventions: 1 to 1 sessions, Unit Group sessions and Medication administration.  Evaluation of Outcomes: Not Met   RN Treatment Plan for Primary Diagnosis: <principal problem not specified> Long Term Goal(s): Knowledge of disease and therapeutic regimen to maintain health will improve  Short Term Goals: Ability to participate in decision making will improve, Ability to verbalize feelings will improve, Ability to disclose and discuss suicidal ideas, Ability to identify and develop effective coping behaviors will improve and Compliance with prescribed medications will improve  Medication Management: RN will administer medications as ordered by provider, will assess and evaluate patient's response and provide education to patient for prescribed medication. RN will report any adverse and/or side effects to prescribing provider.  Therapeutic Interventions: 1 on 1 counseling sessions, Psychoeducation, Medication administration, Evaluate responses to treatment, Monitor vital signs and CBGs as ordered, Perform/monitor CIWA, COWS, AIMS and Fall Risk screenings as ordered, Perform wound care treatments as ordered.  Evaluation of Outcomes: Not Met   LCSW Treatment Plan for Primary Diagnosis: <principal problem not specified> Long Term Goal(s): Safe transition to appropriate next level of care at discharge, Engage patient in therapeutic group  addressing interpersonal concerns.  Short Term Goals: Engage patient in aftercare planning with referrals and resources and Increase skills for wellness and recovery  Therapeutic Interventions: Assess for all discharge needs, 1 to 1 time with Social worker, Explore available resources and support systems, Assess for adequacy in community support network, Educate family and significant other(s) on suicide prevention, Complete Psychosocial Assessment, Interpersonal group therapy.  Evaluation of Outcomes: Not Met   Progress in  Treatment: Attending groups: No. New to unit  Participating in groups: No. Taking medication as prescribed: Yes. Toleration medication: Yes. Family/Significant other contact made: No, will contact:  if given consent  Patient understands diagnosis: No. Discussing patient identified problems/goals with staff: Yes. Medical problems stabilized or resolved: Yes. Denies suicidal/homicidal ideation: Yes. Issues/concerns per patient self-inventory: No. Other:   New problem(s) identified: No, Describe:  none  New Short Term/Long Term Goal(s): Medication stabilization, elimination of SI thoughts, and development of a comprehensive mental wellness plan.   Patient Goals:  "get back on track"  Discharge Plan or Barriers: CSW will continue to follow up for appropriate referrals and possible discharge planning  Reason for Continuation of Hospitalization: Aggression Delusions  Hallucinations Homicidal ideation Medication stabilization Suicidal ideation  Estimated Length of Stay: 3-5 days   Attendees: Patient: Azjah Pardo  10/11/2019   Physician: Dr. Jake Samples, MD 10/11/2019   Nursing: Gilberto Better., RN 10/11/2019   RN Care Manager: 10/11/2019   Social Worker: Lurline Idol, LCSW  10/11/2019   Recreational Therapist:  10/11/2019   Other: Ovidio Kin, MSW intern  10/11/2019   Other:  10/11/2019   Other: 10/11/2019     Scribe for Treatment Team: Billey Chang, [place] [employer] 10/11/2019 9:33 AM

## 2019-10-11 NOTE — Progress Notes (Signed)
Pt becoming increasingly louder on unit, walking the halls and yelling at her hallucinations. Pt given Zyprexa 10 mg po for agitation.

## 2019-10-11 NOTE — Progress Notes (Signed)
Pt still agitated and yelling at her hallucinations. Pt given Ativan 2 mg po. Will continue to monitor.
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
## 2019-10-11 NOTE — B[facility] Counselor (Signed)
Adult Comprehensive Assessment  Patient ID: Wendy Berry, female   DOB: 08-24-1985, 34 y.o.   MRN: [id number]  Information Source: Information source: Patient  Current Stressors:  Patient states their primary concerns and needs for treatment are:: "I'm having trouble with my neighbors." Patient states their goals for this hospitilization and ongoing recovery are:: "get back on track" Social relationships: Pt continues to report having problems with people in the neighborhood.  Pt reports no other stressors.   Living/Environment/Situation:  Living Arrangements: Parent Living conditions (as described by patient or guardian): good Who else lives in the home?: mother How long has patient lived in current situation?: "all my life" What is atmosphere in current home: Comfortable, Supportive  Family History:  Marital Status: single Are you sexually active? No Does patient have children? No.  Childhood History:  By whom was/is the patient raised?: Both parents Additional childhood history information: mom and dad: parents split when pt was 3, pt lived with mother after this. Pt chose to not have contact with her father after this.  Description of patient's relationship with caregiver when they were a child: mom: good, dad: "never did like him" Patient's description of current relationship with people who raised him/her: mom: good, dad: no contact How were you disciplined when you got in trouble as a child/adolescent?: no discipline at all Does patient have siblings?: Yes Number of Siblings: 1 Description of patient's current relationship with siblings: brother: good relationship Did patient suffer any verbal/emotional/physical/sexual abuse as a child?: No Did patient suffer from severe childhood neglect?: No Has patient ever been sexually abused/assaulted/raped as an adolescent or adult?: No Was the patient ever a victim of a crime or a disaster?: No Witnessed domestic violence?:  No Has patient been effected by domestic violence as an adult?: No 10/11/19: no update to trauma history  Education:  Highest grade of school patient has completed: HS diploma Currently a student?: No Learning disability?: No  Employment/Work Situation:   Employment situation: Employed Where is patient currently employed?: Pt appeared confused, "some man on [address]".  Last admit pt reported working at [employer] but did not endorse this currently.  How long has patient been employed?: unclear Patient's job has been impacted by current illness: No What is the longest time patient has a held a job?: current job Did You Receive Any Psychiatric Treatment/Services While in Passenger transport manager?: No Are There Guns or Other Weapons in [place]?: No  Financial Resources:   Financial resources: Income from employment, Support from parents / caregiver, Medicaid Does patient have a representative payee or guardian?: No  Alcohol/Substance Abuse:   What has been your use of drugs/alcohol within the last 12 months?: Pt denies any use of alcohol or drugs. If attempted suicide, did drugs/alcohol play a role in this?: No Alcohol/Substance Abuse Treatment Hx: Denies past history Has alcohol/substance abuse ever caused legal problems?: No  Social Support System:   Patient's Community Support System: Fair Astronomer System: brother, mother Type of faith/religion: none How does patient's faith help to cope with current illness?: na  Leisure/Recreation:   Leisure and Hobbies: walking  Strengths/Needs:   What is the patient's perception of their strengths?: Previously: empowering, sweet person. Pt did not want to add anything for current admission.  Patient states they can use these personal strengths during their treatment to contribute to their recovery: pt unable to answer Patient states these barriers may affect/interfere with their treatment: none Patient states these  barriers may affect  their return to the community: none Other important information patient would like considered in planning for their treatment: none  Discharge Plan:   Currently receiving community mental health services: Yes Patient states concerns and preferences for aftercare planning are: [facility] team Patient states they will know when they are safe and ready for discharge when: "I'm safe now" Does patient have access to transportation?: Yes Does patient have financial barriers related to discharge medications?: No Will patient be returning to same living situation after discharge?: Yes    Summary/Recommendations:   Summary and Recommendations (to be completed by the evaluator): Pt is 34 year old female from [place].  Pt is diagnosed with schizophrenia and was admitted under IVC due to mania and delusions.  Recommendations for pt include crisis stabilization, therapeutic milieu, attend and participate in groups, medication management, and development of comprehensive mental wellness plan.  Lorri Frederick. 10/11/2019

## 2019-10-11 NOTE — Progress Notes (Addendum)
   10/11/19 0524  Psych Admission Type (Psych Patients Only)  Admission Status Involuntary  Psychosocial Assessment  Patient Complaints None  Eye Contact Fair  Facial Expression Anxious;Animated  Affect Labile;Anxious;Irritable  Speech Rapid;Pressured;Loud;Slurred;Tangential  Interaction Assertive  Motor Activity Restless  Appearance/Hygiene Unremarkable  Behavior Characteristics Anxious;Guarded;Irritable  Mood Anxious  Aggressive Behavior  Effect No apparent injury  Thought Process  Coherency Disorganized;Flight of ideas;Loose associations  Content Preoccupation  Delusions Paranoid  Perception Hallucinations  Hallucination Auditory  Judgment Poor  Confusion UTA  Danger to Self  Current suicidal ideation? Denies  Danger to Others  Danger to Others None reported or observed   Pt is awake now and can be heard talking loudly to herself in her room. When this writer walked in, pt switched to talking to me. Pt denies SI, HI, AVH and pain even though she is clearly responding. Pt believes she is being stalked. "When is the doctor coming in because I'm supposed to leave today. I've got this lady with dentures  stalking me. She's darker than I am and no top teeth and she's always in my yard and has my name in her mouth all the time." Pt will flip from speaking to this writer to speaking to her hallucinations.

## 2019-10-11 NOTE — Progress Notes (Signed)
   10/11/19 0905  Pain Assessment  Pain Scale 0-10  Pain Score 0  D: Patient Presents with appropriate mood and affect.  Patient was calm and cooperative during med pass and took medicine without incident.  Patient was out in open areas and was social with peers and staff. Patient attended group. Patient denies suicidal thoughts and self harming thoughts.  Pt. complained of outer left knee pain. .A:  Patient took scheduled medicine.  Support and encouragement provided Routine safety checks conducted every 15 minutes. Patient  Informed to notify staff with any concerns.  Safety maintained.R:  No adverse drug reactions noted.  Patient contracts for safety.  Patient compliant with medication and treatment plan. Patient cooperative and calm. Patient interacts well with others on the unit.  Safety maintained.

## 2019-10-11 NOTE — Progress Notes (Signed)
D: Pt denies SI/HI/AV hallucinations. Pt is pleasant and cooperative.  A: Pt was offered support and encouragement. Pt was given scheduled medications.  Q 15 minute checks were done for safety.  R: Pt is taking medication. Pt has no complaints.Pt receptive to treatment and safety maintained on unit.

## 2019-10-11 NOTE — Progress Notes (Signed)
D: Patient Presents with appropriate mood and affect.  Patient was calm and cooperative during med pass and took medicine without incident.  Patient was out in open areas and was social with peers and staff. Patient attended group. Patient denies suicidal thoughts and self harming thoughts.  Pt. complained of outer left knee pain. .A:  Patient took scheduled medicine.  Support and encouragement provided Routine safety checks conducted every 15 minutes. Patient  Informed to notify staff with any concerns.  Safety maintained.R:  No adverse drug reactions noted.  Patient contracts for safety.  Patient compliant with medication and treatment plan. Patient cooperative and calm. Patient interacts well with others on the unit.  Safety maintained.

## 2019-10-11 NOTE — Progress Notes (Signed)
Recreation Therapy Notes  Date: 4.9.21 Time: 1000 Location: 500 Hall Dayroom Group Topic: Communication, Team Building, Problem Solving  Goal Area(s) Addresses:  Patient will effectively work with peer towards shared goal.  Patient will identify skills used to make activity successful.  Patient will identify how skills used during activity can be used to reach post d/c goals.   Behavioral Response: Engaged  Intervention: STEM Activity  Activity: Stage manager. In teams patients were given 12 plastic drinking straws and a length of masking tape. Using the materials provided patients were asked to build a landing pad to catch a golf ball dropped from approximately 6 feet in the air.   Education: Pharmacist, community, Discharge Planning   Education Outcome: Acknowledges education/In group clarification offered/Needs additional education.   Clinical Observations/Feedback: Pt worked well with peers and Theatre stage manager.  Pt seemed a little drowsy but was able to full participate in group.  Pt also seemed to be more of the leader in the group.  Pt was appropriate and active throughout group.    Caroll Rancher, LRT/CTRS     Caroll Rancher A 10/11/2019 11:36 AM

## 2019-10-11 NOTE — Progress Notes (Signed)
Recreation Therapy Notes  Patient admitted to unit 4.8.21. Due to admission within last year, no new assessment conducted at this time. Last assessment conducted 6.5.20. Patient reports no changes in stressors from previous admission. Patient stated reason for admission is someone had her brought here.  Patient expressed some leisure interests are going out to eat and driving places.  Patient identifies strengths as staying positive and being a go getter.  Patient identified areas of improvement as the people she lives around.  Patient denies SI, HI, AVH at this time. Patient doesn't report having a goal because patient stated "I don't know why I'm here".  Information found below from assessment conducted 6.5.20   Coping Skills:  TV, Music, Exercise, Talk, Avoidance  Leisure Interests: Walking     Wendy Berry, LRT/CTRS   Caroll Rancher A 10/11/2019 12:04 PM

## 2019-10-12 MED ORDER — LORAZEPAM 1 MG PO TABS
1.0000 mg | ORAL_TABLET | ORAL | Status: AC | PRN
  Administered 2019-10-14: 1 mg via ORAL
  Filled 2019-10-12: qty 1

## 2019-10-12 MED ORDER — PROPRANOLOL HCL 20 MG PO TABS
20.0000 mg | ORAL_TABLET | Freq: Three times a day (TID) | ORAL | Status: DC
  Administered 2019-10-12 – 2019-10-13 (×2): 20 mg via ORAL
  Filled 2019-10-12 (×5): qty 1

## 2019-10-12 MED ORDER — OLANZAPINE 10 MG PO TBDP
10.0000 mg | ORAL_TABLET | Freq: Three times a day (TID) | ORAL | Status: DC | PRN
  Administered 2019-10-13 – 2019-10-21 (×7): 10 mg via ORAL
  Filled 2019-10-12 (×8): qty 1

## 2019-10-12 MED ORDER — DIVALPROEX SODIUM 500 MG PO DR TAB
500.0000 mg | DELAYED_RELEASE_TABLET | Freq: Every day | ORAL | Status: DC
  Administered 2019-10-12 – 2019-10-14 (×2): 500 mg via ORAL
  Filled 2019-10-12 (×4): qty 1

## 2019-10-12 MED ORDER — ZIPRASIDONE MESYLATE 20 MG IM SOLR: 20.0000 mg | INTRAMUSCULAR | Status: DC | PRN

## 2019-10-12 MED ORDER — DIVALPROEX SODIUM 250 MG PO DR TAB
250.0000 mg | DELAYED_RELEASE_TABLET | Freq: Every morning | ORAL | Status: DC
  Administered 2019-10-13 – 2019-10-21 (×8): 250 mg via ORAL
  Filled 2019-10-12 (×13): qty 1

## 2019-10-12 NOTE — Progress Notes (Signed)
Adult Psychoeducational Group Note  Date:  10/12/2019 Time:  10:05 PM  Group Topic/Focus:  Wrap-Up Group:   The focus of this group is to help patients review their daily goal of treatment and discuss progress on daily workbooks.  Participation Level:  Minimal  Participation Quality:  Drowsy and Resistant  Affect:  Flat and Irritable  Cognitive:  Disorganized and Confused  Insight: Limited  Engagement in Group:  Lacking and Limited  Modes of Intervention:  Discussion  Additional Comments: Pt stated her goal for today was to focus on her treatment plan. Pt stated she accomplished her goal today. Pt stated her relationship with her family has improved since she was admitted. Pt stated been able to contact her Mother today improved her day. Pt rated her overall day a 9 out of 10. Pt stated her appetite was pretty good today. Pt stated her sleep last night was poor. Pt nurse was made aware of the situation. Pt stated she was in no physical pain today.  Pt deny auditory or visual hallucinations. Pt denies thoughts of harming herself or others. Pt stated she would alert staff if anything changes.   Wendy Berry 10/12/2019, 10:05 PM

## 2019-10-12 NOTE — Progress Notes (Signed)
   10/12/19 2050  Psych Admission Type (Psych Patients Only)  Admission Status Involuntary  Psychosocial Assessment  Patient Complaints None  Eye Contact Fair  Facial Expression Animated  Affect Appropriate to circumstance  Speech Slurred;Slow  Interaction Assertive  Motor Activity Slow  Appearance/Hygiene Unremarkable  Behavior Characteristics Cooperative  Mood Preoccupied;Pleasant  Aggressive Behavior  Targets Other (Comment) (towards staff)  Type of Behavior Verbal;Unprovoked  Effect No apparent injury  Thought Process  Coherency Disorganized  Content Preoccupation  Delusions None reported or observed  Perception Hallucinations  Hallucination Auditory  Judgment Poor  Confusion Mild  Danger to Self  Current suicidal ideation? Denies  Danger to Others  Danger to Others None reported or observed  Danger to Others Abnormal  Harmful Behavior to others Threats of violence towards other people observed or expressed   Destructive Behavior No threats or harm toward property

## 2019-10-12 NOTE — Progress Notes (Signed)
   10/12/19 2050  COVID-19 Daily Checkoff  Have you had a fever (temp > 37.80C/100F)  in the past 24 hours?  No  If you have had runny nose, nasal congestion, sneezing in the past 24 hours, has it worsened? No  COVID-19 EXPOSURE  Have you traveled outside the state in the past 14 days? No  Have you been in contact with someone with a confirmed diagnosis of COVID-19 or PUI in the past 14 days without wearing appropriate PPE? No  Have you been living in the same home as a person with confirmed diagnosis of COVID-19 or a PUI (household contact)? No  Have you been diagnosed with COVID-19? No
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
## 2019-10-12 NOTE — Progress Notes (Signed)
   10/12/19 0900  Psych Admission Type (Psych Patients Only)  Admission Status Involuntary  Psychosocial Assessment  Patient Complaints None  Eye Contact Fair  Facial Expression Animated  Affect Appropriate to circumstance  Speech Rapid;Pressured  Interaction Assertive  Motor Activity Slow  Appearance/Hygiene Unremarkable  Behavior Characteristics Cooperative  Mood Other (Comment) (appropriate to circmstance)  Thought Process  Coherency Disorganized  Content Preoccupation  Delusions None reported or observed  Perception WDL  Hallucination None reported or observed  Judgment Poor  Confusion None  Danger to Self  Current suicidal ideation? Denies  Danger to Others  Danger to Others None reported or observed   Pt's speech is rapid, pressured and difficult to understand. Pt could be heard outside of her room talking to herself. Pt c/o leg discomfort pointing to her thighs and was given prn tylenol. Pt's mood is labile at times. Medication given and taken as prescribed. Offered encouragement and 15 minute checks. She denies si and hi thoughts.  Safety maintained on the unit.

## 2019-10-12 NOTE — Progress Notes (Signed)
Pt continues to refuse EKG. She was given a urine cup and reminded multiple times the need for a specimen. She continues to be labile with slurred speech. Pt's blood pressure is elevated this evening. Pt required much redirection when getting her blood pressure as she kept moving her arm. Gave scheduled medications and will attempt to recheck blood pressure when pt returns from dinner. Safety maintained on the unit.

## 2019-10-12 NOTE — [facility] Group Notes (Signed)
[facility] Group Notes: (Clinical Social Work)   10/12/2019      Type of Therapy:  Group Therapy   Participation Level:  Did Not Attend - was invited individually by Nurse or MHT and chose not to attend.   Leisa Lenz, LCSWA 10/12/2019  12:46 PM

## 2019-10-12 NOTE — Progress Notes (Signed)
Pt has been up most the night. Pt appeared confused does not seem to understand what is being communicated to her. Pt observed at some point wondering into other pts rooms and requires constant observation. Pt also threaten to leave and tried to open the double door. Pt placed on unit restriction for safety purposes, will continue to monitor.

## 2019-10-13 MED ORDER — METOPROLOL SUCCINATE ER 25 MG PO TB24
25.0000 mg | ORAL_TABLET | Freq: Every day | ORAL | Status: DC
  Administered 2019-10-13 – 2019-10-14 (×2): 25 mg via ORAL
  Filled 2019-10-13 (×3): qty 1

## 2019-10-13 MED ORDER — AMOXICILLIN 500 MG PO CAPS
500.0000 mg | ORAL_CAPSULE | Freq: Three times a day (TID) | ORAL | Status: DC
  Administered 2019-10-13 – 2019-10-19 (×16): 500 mg via ORAL
  Filled 2019-10-13 (×2): qty 1
  Filled 2019-10-13: qty 2
  Filled 2019-10-13 (×22): qty 1
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
## 2019-10-13 NOTE — Progress Notes (Signed)
Patient did not give urine sample.

## 2019-10-13 NOTE — [facility] Group Notes (Signed)
[facility] Group Notes: (Clinical Social Work)   10/13/2019      Type of Therapy:  Group Therapy   Participation Level:  Did Not Attend - was invited individually by Nurse or MHT and chose not to attend.   Leisa Lenz, LCSWA 10/13/2019  2:05 PM

## 2019-10-13 NOTE — Progress Notes (Signed)
Writer observed patient asleep after shift report. She was later awakened so that Clinical research associate could speak with her. She was difficult to understand due to her slurred speech. Writer was able to make out that she has slept most of the day. She denied having pain, -si/hi auditory and visual hallucinations. Writer asked if she had thoughts about her neighbor and she did say that they are stalking her. Writer did make out that she works at [employer]. She did receive snack and writer wished her a happy birthday. Writer asked if she remembered that today was her birthday she smiled and said yes. She thanked Clinical research associate for her birthday wish and returned to her room with her snack. Safety maintained with 15 min checks.

## 2019-10-13 NOTE — Progress Notes (Signed)
Writer received an order from NP Gillermo Murdoch to hold hs medications except for her antibiotic and blood pressure medication. Her blood pressure is significantly elevated and she talks with slurred speech. Patient reports dizziness upon standing. She was asked to use call bell if getting up to restroom during the night. Safety maintained with 15 min checks.
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
## 2019-10-13 NOTE — Progress Notes (Signed)
   10/13/19 1955  COVID-19 Daily Checkoff  Have you had a fever (temp > 37.80C/100F)  in the past 24 hours?  No  If you have had runny nose, nasal congestion, sneezing in the past 24 hours, has it worsened? No  COVID-19 EXPOSURE  Have you traveled outside the state in the past 14 days? No  Have you been in contact with someone with a confirmed diagnosis of COVID-19 or PUI in the past 14 days without wearing appropriate PPE? No  Have you been living in the same home as a person with confirmed diagnosis of COVID-19 or a PUI (household contact)? No  Have you been diagnosed with COVID-19? No

## 2019-10-13 NOTE — Plan of Care (Signed)
Progress note  D: pt found in bed; compliant with medication administration. Pt seems preoccupied and was using racial slurs at the medication window. Pt was guarded and agitated in affect. Pt denies any physical pain or symptoms. Pt denies si/hi/ah/vh and verbally agrees to approach staff if these become apparent or before harming themself/others while at [facility].  A: Pt provided support and encouragement. Pt given medication per protocol and standing orders. Q26m safety checks implemented and continued.  R: Pt safe on the unit. Will continue to monitor.  Pt progressing in the following metrics  Problem: Education: Goal: Knowledge of the prescribed therapeutic regimen will improve Outcome: Progressing   Problem: Activity: Goal: Imbalance in normal sleep/wake cycle will improve Outcome: Progressing   Problem: Health Behavior/Discharge Planning: Goal: Compliance with therapeutic regimen will improve Outcome: Progressing

## 2019-10-13 NOTE — Progress Notes (Signed)
Adult Psychoeducational Group Note  Date:  10/13/2019 Time:  10:24 PM  Group Topic/Focus:  Wrap-Up Group:   The focus of this group is to help patients review their daily goal of treatment and discuss progress on daily workbooks.  Participation Level:  Minimal  Participation Quality:  Drowsy and Inattentive  Affect:  Flat, Irritable, Labile, Lethargic and Not Congruent  Cognitive:  Disorganized and Confused  Insight: Lacking and Limited  Engagement in Group:  Lacking and Limited  Modes of Intervention:  Discussion  Additional Comments:  Pt stated her goal for today was to focus on her treatment plan. Pt stated she accomplished her goal today. Pt rated her overall day a 9 out of 10. Pt stated she felt better about herself today. Pt stated her appetite was pretty good today. Pt stated her sleep last night was pretty good. Pt stated she was in no physical pain today.  Pt deny auditory or visual hallucinations. Pt denies thoughts of harming herself or others. Pt stated she would alert staff if anything changes.  Wendy Berry 10/13/2019, 10:24 PM

## 2019-10-13 NOTE — [facility] Group Notes (Signed)
Adult Psychoeducational Group Note  Date:  10/13/2019 Time:  12:18 PM  Group Topic/Focus:   PROGRESSIVE RELAXATION.Marland Kitchen A group where deep breathing is taught and tensing and relaxation muscle groups is used. Imagery is used with both deep breathing and tensing and relaxing of the muscle groups. Pt;s are asked to imagine 3 pillars that hold them up when they are not able to hold themselves up.  Participation Level:  Did Not Attend   Dione Housekeeper 10/13/2019, 12:18 PM

## 2019-10-14 MED ORDER — METOPROLOL SUCCINATE ER 50 MG PO TB24
50.0000 mg | ORAL_TABLET | Freq: Every day | ORAL | Status: DC
  Filled 2019-10-14 (×2): qty 1

## 2019-10-14 MED ORDER — PALIPERIDONE PALMITATE ER 234 MG/1.5ML IM SUSY
234.0000 mg | PREFILLED_SYRINGE | Freq: Once | INTRAMUSCULAR | Status: AC
  Administered 2019-10-14: 234 mg via INTRAMUSCULAR
  Filled 2019-10-14: qty 1.5
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
## 2019-10-14 NOTE — Progress Notes (Signed)
   10/14/19 2000  Psych Admission Type (Psych Patients Only)  Admission Status Involuntary  Psychosocial Assessment  Patient Complaints Anxiety  Eye Contact Avoids;Brief  Facial Expression Anxious;Pensive  Affect Anxious;Preoccupied  Speech Logical/coherent  Interaction Avoidant;Cautious;Forwards little;Guarded;Minimal  Motor Activity Slow  Appearance/Hygiene Unremarkable  Behavior Characteristics Cooperative  Mood Anxious;Preoccupied;Suspicious  Thought Process  Coherency WDL  Content Paranoia  Delusions Paranoid  Perception Hallucinations  Hallucination Auditory  Judgment Poor  Confusion Mild  Danger to Self  Current suicidal ideation? Denies  Danger to Others  Danger to Others None reported or observed  Danger to Others Abnormal  Harmful Behavior to others No threats or harm toward other people

## 2019-10-14 NOTE — Progress Notes (Signed)
Patient can be heard down the hall arguing with herself. She is delusional talking about people lying on her. Wendy Berry with braids and her hair parted in the middle.  She is getting more and more agitated and took ativan 2 mg. She remained in her room getting louder and louder. Will monitor effectiveness of medication.

## 2019-10-14 NOTE — Progress Notes (Signed)
Recreation Therapy Notes  Date: 4.12.21 Time: 1000 Location: 500 Hall Dayroom  Group Topic: Coping Skills  Goal Area(s) Addresses:  Patient will identify positive coping skills. Patient will identify benefit of using coping skills post d/c.  Intervention: Game  Activity: Chartered loss adjuster.  LRT and patients played Jenga as the instructions described, in addition, patients were to identify a positive coping skill.  Patients and LRT could not repeat coping skills that had already been identify.  When the tower fell over, the game would restart and all coping skills were up for grabs.  Education:Coping Skills, Discharge Planning.   Education Outcome: Acknowledges understanding/In group clarification offered/Needs additional education.   Clinical Observations/Feedback: Pt did not attend group session.    Caroll Rancher, LRT/CTRS         Caroll Rancher A 10/14/2019 11:54 AM
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
## 2019-10-14 NOTE — [facility] Group Notes (Signed)
LCSW Aftercare Discharge Planning Group Note  10/14/2019   Type of Group and Topic: Psychoeducational Group: Discharge Planning  Participation Level: Did Not Attend  Description of Group  Discharge planning group reviews patient's anticipated discharge plans and assists patients to anticipate and address any barriers to wellness/recovery in the community. Suicide prevention education is reviewed with patients in group.  Therapeutic Goals  1. Patients will state their anticipated discharge plan and mental health aftercare  2. Patients will identify potential barriers to wellness in the community setting  3. Patients will engage in problem solving, solution focused discussion of ways to anticipate and address barriers to wellness/recovery  Summary of Patient Progress  Plan for Discharge/Comments:  Transportation Means:  Supports:  Therapeutic Modalities:  Motivational Interviewing  Aveen Stansel C Charly Holcomb, LCSWA  10/14/2019 2:14 PM 

## 2019-10-14 NOTE — Plan of Care (Signed)
Progress note  D: pt found in bed eating; compliant with medication administration. Pt continues to be guarded and minimal with an angry affect, but pt isn't responding as much as they were yesterday. Pt went back to sleep after morning medications. Pt has been pleasant. Pt denies si/hi/ah/vh and verbally agrees to approach staff if these become apparent or before harming themself/others while at bhh.  A: Pt provided support and encouragement. Pt given medication per protocol and standing orders. Q11m safety checks implemented and continued.  R: Pt safe on the unit. Will continue to monitor.  Pt progressing in the following metrics  Problem: Education: Goal: Ability to state activities that reduce stress will improve Outcome: Progressing   Problem: Coping: Goal: Ability to identify and develop effective coping behavior will improve Outcome: Progressing   Problem: Health Behavior/Discharge Planning: Goal: Ability to make decisions will improve Outcome: Progressing

## 2019-10-14 NOTE — Progress Notes (Signed)
Pt was a little agitated , but came to writer expressing the desire to take her night medications.

## 2019-10-15 DIAGNOSIS — F200 Paranoid schizophrenia: Secondary | ICD-10-CM | POA: Diagnosis not present

## 2019-10-15 MED ORDER — WHITE PETROLATUM EX OINT: TOPICAL_OINTMENT | CUTANEOUS | Status: DC | PRN

## 2019-10-15 MED ORDER — CLONAZEPAM 1 MG PO TABS
2.0000 mg | ORAL_TABLET | Freq: Once | ORAL | Status: AC
  Administered 2019-10-18: 2 mg via ORAL

## 2019-10-15 MED ORDER — LORAZEPAM 2 MG/ML IJ SOLN
4.0000 mg | Freq: Once | INTRAMUSCULAR | Status: AC
  Administered 2019-10-15: 4 mg via INTRAMUSCULAR

## 2019-10-15 MED ORDER — ZIPRASIDONE MESYLATE 20 MG IM SOLR
INTRAMUSCULAR | Status: AC
  Filled 2019-10-15: qty 20

## 2019-10-15 MED ORDER — METOPROLOL SUCCINATE ER 100 MG PO TB24
100.0000 mg | ORAL_TABLET | Freq: Every day | ORAL | Status: DC
  Administered 2019-10-16 – 2019-10-21 (×6): 100 mg via ORAL
  Filled 2019-10-15 (×5): qty 1
  Filled 2019-10-15: qty 14
  Filled 2019-10-15 (×3): qty 1

## 2019-10-15 MED ORDER — ZIPRASIDONE MESYLATE 20 MG IM SOLR
20.0000 mg | Freq: Once | INTRAMUSCULAR | Status: AC
  Administered 2019-10-15: 20 mg via INTRAMUSCULAR

## 2019-10-15 MED ORDER — LORAZEPAM 2 MG/ML IJ SOLN
INTRAMUSCULAR | Status: AC
  Filled 2019-10-15: qty 2

## 2019-10-15 MED ORDER — WHITE PETROLATUM EX OINT
TOPICAL_OINTMENT | CUTANEOUS | Status: AC
  Filled 2019-10-15: qty 5

## 2019-10-15 MED ORDER — DIVALPROEX SODIUM 500 MG PO DR TAB
500.0000 mg | DELAYED_RELEASE_TABLET | Freq: Two times a day (BID) | ORAL | Status: DC
  Administered 2019-10-15 – 2019-10-21 (×9): 500 mg via ORAL
  Filled 2019-10-15 (×2): qty 1
  Filled 2019-10-15: qty 14
  Filled 2019-10-15 (×11): qty 1
  Filled 2019-10-15: qty 14
  Filled 2019-10-15 (×2): qty 1

## 2019-10-15 NOTE — Progress Notes (Signed)
Pt requested to go to bathroom.  MHT assisted pt to bathroom.  Pt was calm and cooperative.  This RN and RN Raford Pitcher assisted pt back to her bedroom where pt is resting at this time.  This RN notified Dr. Jeannine Kitten that pt was calm and cooperative.  MD said it was okay to DC seclusion order.  This RN will continue to monitor and provide assistance as needed.

## 2019-10-15 NOTE — Progress Notes (Signed)
Pt stated " I hate you I always hated you"
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
## 2019-10-15 NOTE — Progress Notes (Signed)
Pt up verbally aggressive, pt stated"I don't want to Fucking talk to you"

## 2019-10-15 NOTE — Progress Notes (Signed)
Pt resting comfortably.  No signs or symptoms of distress noted.  Pt's breathing is even and unlabored.  Hayes Ludwig, RN is sitting outside of pt's  seclusion room, monitoring pt.  No needs are concerns are noted at this time.

## 2019-10-15 NOTE — Progress Notes (Signed)
Psychoeducational Group Note  Date:  10/15/2019 Time:  2045  Group Topic/Focus:  Wrap-Up Group:   The focus of this group is to help patients review their daily goal of treatment and discuss progress on daily workbooks.  Participation Level: Did Not Attend  Participation Quality:  Not Applicable  Affect:  Not Applicable  Cognitive:  Not Applicable  Insight:  Not Applicable  Engagement in Group: Not Applicable  Additional Comments:  The patient did not attend group this evening.   Hazle Coca S 10/15/2019, 8:45 PM
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
## 2019-10-15 NOTE — Progress Notes (Signed)
Recreation Therapy Notes  Date: 4.13.21 Time: 1000 Location: 500 Hall Dayroom  Group Topic: Anxiety  Goal Area(s) Addresses:  Patient will identify triggers to anxiety. Patient will identify physical symptoms to anxiety. Patient will identify coping strategies for anxiety that can be used post d/c.  Intervention: Worksheet, pencils  Activity: Introduction to Anxiety.  Patients were to identify three things that trigger their anxiety, three physical symptoms they experience from anxiety, three thoughts they have from anxiety and coping strategies they use for anxiety.  Education: Communication, Discharge Planning  Education Outcome: Acknowledges understanding/In group clarification offered/Needs additional education.   Clinical Observations/Feedback: Pt did not attend group session.    Caroll Rancher, LRT/CTRS         Caroll Rancher A 10/15/2019 11:32 AM

## 2019-10-15 NOTE — Progress Notes (Signed)
Pt is resting comfortably.  No signs or symptoms of distress noted. Pt's breathing is even and unlabored.  Hayes Ludwig, RN is sitting outside of pt's seclusion room, monitoring pt.  No needs are concerns are noted at this time.  This RN and Hayes Ludwig RN will continue to monitor and provide interventions as needed.

## 2019-10-15 NOTE — Progress Notes (Signed)
   10/15/19 2000  Psych Admission Type (Psych Patients Only)  Admission Status Involuntary  Psychosocial Assessment  Patient Complaints Anxiety  Eye Contact Avoids;Brief  Facial Expression Anxious;Pensive  Affect Anxious;Preoccupied  Speech Logical/coherent  Interaction Avoidant;Cautious;Forwards little;Guarded;Minimal  Motor Activity Slow  Appearance/Hygiene Unremarkable  Behavior Characteristics Cooperative  Mood Anxious  Thought Process  Coherency WDL  Content Paranoia  Delusions Paranoid  Perception Hallucinations  Hallucination Auditory  Judgment Poor  Confusion Mild  Danger to Self  Current suicidal ideation? Denies  Danger to Others  Danger to Others None reported or observed  Danger to Others Abnormal  Harmful Behavior to others No threats or harm toward other people

## 2019-10-16 MED ORDER — LORAZEPAM 2 MG/ML IJ SOLN: 4.0000 mg | Freq: Once | INTRAMUSCULAR | Status: DC

## 2019-10-16 NOTE — Progress Notes (Signed)
Pt presents with delusional thinking and Is paranoid.  Pt continues to be hyperfocused on a neighbor who is "out to get" pt.  Pt asked for printouts of her assessments she's had this hospitalization.  Pt needed prn ativan because of increased anxiety.  Pt has been sleeping and appears to be in no acute distress. RN provided pt with redirection and reassurance.  RN administered mediations as prescribed.   RN will continue to monitor and provide support as needed.

## 2019-10-16 NOTE — Progress Notes (Signed)
[facility] MD Progress Note  10/16/2019 9:52 AM SHEALA DOSH  MRN:  [id number] Subjective:   Remains paranoid, focused on individuals who were stalking her, insisting on seeing all of her paperwork that led to the hospitalization/commitment papers which she has seen previously but now she wants printouts so forth.  Irritable and argumentative and again the milieu is not helping due to the acute agitation around her. Violent yesterday spitting at examiner so forth/seen with escort Principal Problem: Schizoaffective acute psychosis Diagnosis: Active Problems:   Schizophrenia (HCC)  Total Time spent with patient: 20 minutes  Past Psychiatric History: see eval  Past Medical History:  Past Medical History:  Diagnosis Date  . Cerebral aneurysm   . Heart murmur   . Paranoid schizophrenia [facility])     Past Surgical History:  Procedure Laterality Date  . BRAIN SURGERY     Family History: History reviewed. No pertinent family history. Family Psychiatric  History: see eval Social History:  Social History   Substance and Sexual Activity  Alcohol Use No     Social History   Substance and Sexual Activity  Drug Use No    Social History   Socioeconomic History  . Marital status: Single    Spouse name: Not on file  . Number of children: Not on file  . Years of education: Not on file  . Highest education level: Not on file  Occupational History  . Not on file  Tobacco Use  . Smoking status: Never Smoker  . Smokeless tobacco: Never Used  Substance and Sexual Activity  . Alcohol use: No  . Drug use: No  . Sexual activity: Not on file  Other Topics Concern  . Not on file  Social History Narrative  . Not on file   Social Determinants of Health   Financial Resource Strain:   . Difficulty of Paying Living Expenses:   Food Insecurity:   . Worried About Programme researcher, broadcasting/film/video in the Last Year:   . Barista in the Last Year:   Transportation Needs:   . Freight forwarder  (Medical):   Marland Kitchen Lack of Transportation (Non-Medical):   Physical Activity:   . Days of Exercise per Week:   . Minutes of Exercise per Session:   Stress:   . Feeling of Stress :   Social Connections:   . Frequency of Communication with Friends and Family:   . Frequency of Social Gatherings with Friends and Family:   . Attends Religious Services:   . Active Member of Clubs or Organizations:   . Attends Banker Meetings:   Marland Kitchen Marital Status:    Additional Social History:    Pain Medications: Please see MAR Prescriptions: Please see MAR Over the Counter: Please see MAR History of alcohol / drug use?: No history of alcohol / drug abuse Longest period of sobriety (when/how long): Pt denies SA                    Sleep: Fair  Appetite:  Fair  Current Medications: Current Facility-Administered Medications  Medication Dose Route Frequency Provider Last Rate Last Admin  . acetaminophen (TYLENOL) tablet 650 mg  650 mg Oral Q6H PRN Aldean Baker, NP   650 mg at 10/16/19 0526  . alum & mag hydroxide-simeth (MAALOX/MYLANTA) 200-200-20 MG/5ML suspension 30 mL  30 mL Oral Q4H PRN Aldean Baker, NP      . amoxicillin (AMOXIL) capsule 500 mg  500 mg  Oral Q8H Sharma Covert, MD   500 mg at 10/16/19 0526  . benztropine (COGENTIN) tablet 1 mg  1 mg Oral BID [name], MD   1 mg at 10/16/19 0829  . clonazePAM (KLONOPIN) tablet 2 mg  2 mg Oral Once [name], MD   Stopped at 10/10/19 1247  . clonazePAM (KLONOPIN) tablet 2 mg  2 mg Oral Once [name], MD      . divalproex (DEPAKOTE) DR tablet 250 mg  250 mg Oral AC breakfast Sharma Covert, MD   250 mg at 10/16/19 0829  . divalproex (DEPAKOTE) DR tablet 500 mg  500 mg Oral Q12H [name], MD   500 mg at 10/16/19 0830  . hydrOXYzine (ATARAX/VISTARIL) tablet 25 mg  25 mg Oral TID PRN Connye Burkitt, NP   25 mg at 10/14/19 2010  . loratadine (CLARITIN) tablet 10 mg  10 mg Oral Daily PRN Mordecai Maes, NP   10  mg at 10/12/19 2306  . LORazepam (ATIVAN) tablet 2 mg  2 mg Oral Q6H PRN Connye Burkitt, NP   2 mg at 10/16/19 1610   Or  . LORazepam (ATIVAN) injection 2 mg  2 mg Intramuscular Q6H PRN Connye Burkitt, NP      . LORazepam (ATIVAN) injection 4 mg  4 mg Intramuscular Once [name], MD      . magnesium hydroxide (MILK OF MAGNESIA) suspension 30 mL  30 mL Oral Daily PRN Connye Burkitt, NP      . metoprolol succinate (TOPROL-XL) 24 hr tablet 100 mg  100 mg Oral Daily [name], MD   100 mg at 10/16/19 0830  . OLANZapine zydis (ZYPREXA) disintegrating tablet 10 mg  10 mg Oral Q8H PRN Sharma Covert, MD   10 mg at 10/14/19 1616   And  . ziprasidone (GEODON) injection 20 mg  20 mg Intramuscular PRN Sharma Covert, MD      . risperiDONE (RISPERDAL) tablet 4 mg  4 mg Oral BID [name], MD   4 mg at 10/16/19 0830  . temazepam (RESTORIL) capsule 30 mg  30 mg Oral QHS [name], MD   30 mg at 10/15/19 2116  . traZODone (DESYREL) tablet 50 mg  50 mg Oral QHS PRN Connye Burkitt, NP   50 mg at 10/11/19 2345  . white petrolatum (VASELINE) gel   Topical PRN Dixon, Rashaun M, NP      . ziprasidone (GEODON) injection 20 mg  20 mg Intramuscular Q6H PRN Connye Burkitt, NP   20 mg at 10/14/19 0539    Lab Results: No results found for this or any previous visit (from the past 48 hour(s)).  Blood Alcohol level:  Lab Results  Component Value Date   ETH <10 10/10/2019   ETH <10 96/10/5407    Metabolic Disorder Labs: Lab Results  Component Value Date   HGBA1C 5.0 10/10/2019   MPG 97 10/10/2019   No results found for: PROLACTIN Lab Results  Component Value Date   CHOL 255 (H) 10/10/2019   TRIG 111 10/10/2019   HDL 51 10/10/2019   CHOLHDL 5.0 10/10/2019   VLDL 22 10/10/2019   LDLCALC 182 (H) 10/10/2019   LDLCALC 151 (H) 12/04/2018    Physical Findings: AIMS: Facial and Oral Movements Muscles of Facial Expression: None, normal Lips and Perioral Area: None, normal Jaw: None,  normal Tongue: None, normal,Extremity Movements Upper (arms, wrists, hands, fingers): None, normal Lower (legs, knees, ankles,  toes): None, normal, Trunk Movements Neck, shoulders, hips: None, normal, Overall Severity Severity of abnormal movements (highest score from questions above): None, normal Incapacitation due to abnormal movements: None, normal Patient's awareness of abnormal movements (rate only patient's report): No Awareness, Dental Status Current problems with teeth and/or dentures?: No Does patient usually wear dentures?: No  CIWA:  CIWA-Ar Total: 0 COWS:  COWS Total Score: 2  Musculoskeletal: Strength & Muscle Tone: within normal limits Gait & Station: normal Patient leans: N/A  Psychiatric Specialty Exam: Physical Exam  Review of Systems  Blood pressure (!) 133/100, pulse (!) 119, temperature 98.4 F (36.9 C), temperature source Oral, resp. rate 20, SpO2 100 %.There is no height or weight on file to calculate BMI.  General Appearance: Disheveled  Eye Contact:  Fair  Speech:  Pressured  Volume:  Increased  Mood:  Angry and Irritable  Affect:  Congruent  Thought Process:  Descriptions of Associations: Circumstantial  Orientation:  Full (Time, Place, and Person)  Thought Content:  Illogical, Delusions and Paranoid Ideation  Suicidal Thoughts:  No  Homicidal Thoughts:  No  Memory:  Immediate;   Poor Recent;   Fair Remote;   Fair  Judgement:  Impaired  Insight:  Lacking  Psychomotor Activity:  Normal and Decreased  Concentration:  Concentration: Poor and Attention Span: Poor  Recall:  Poor  Fund of Knowledge:  Poor  Language:  Poor  Akathisia:  Negative  Handed:  Right  AIMS (if indicated):     Assets:  Leisure Time Physical Health  ADL's:  Intact  Cognition:  WNL  Sleep:  Number of Hours: 8     Treatment Plan Summary: Daily contact with patient to assess and evaluate symptoms and progress in treatment and Medication management  Continue  antipsychotic med management and force medications when needed/continue to seek [facility] for [facility] term stabilization  Cherre Kothari, MD 10/16/2019, 9:52 AM

## 2019-10-16 NOTE — Progress Notes (Signed)
   10/16/19 2100  Psych Admission Type (Psych Patients Only)  Admission Status Involuntary  Psychosocial Assessment  Patient Complaints Anger;Agitation  Eye Contact Avoids;Brief  Facial Expression Anxious;Pensive  Affect Anxious;Preoccupied  Speech Logical/coherent  Interaction Avoidant;Cautious;Forwards little;Guarded;Minimal  Motor Activity Slow  Appearance/Hygiene Unremarkable  Behavior Characteristics Agressive verbally  Mood Suspicious;Labile;Anxious  Thought Process  Coherency WDL  Content Paranoia  Delusions Paranoid  Perception Hallucinations  Hallucination Auditory  Judgment Poor  Confusion Mild  Danger to Self  Current suicidal ideation? Denies  Danger to Others  Danger to Others None reported or observed  Danger to Others Abnormal  Harmful Behavior to others No threats or harm toward other people

## 2019-10-16 NOTE — Tx Team (Signed)
Interdisciplinary Treatment and Diagnostic Plan Update  10/16/2019 Time of Session: 8:35am  [name] MRN: [id number]  Principal Diagnosis: <principal problem not specified>  Secondary Diagnoses: Active Problems:   Schizophrenia ([place])   Current Medications:  Current Facility-Administered Medications  Medication Dose Route Frequency Provider Last Rate Last Admin  . acetaminophen (TYLENOL) tablet 650 mg  650 mg Oral Q6H PRN Connye Burkitt, NP   650 mg at 10/16/19 0526  . alum & mag hydroxide-simeth (MAALOX/MYLANTA) 200-200-20 MG/5ML suspension 30 mL  30 mL Oral Q4H PRN Connye Burkitt, NP      . amoxicillin (AMOXIL) capsule 500 mg  500 mg Oral Q8H Sharma Covert, MD   500 mg at 10/16/19 0526  . benztropine (COGENTIN) tablet 1 mg  1 mg Oral BID [name], MD   1 mg at 10/16/19 0829  . clonazePAM (KLONOPIN) tablet 2 mg  2 mg Oral Once [name], MD   Stopped at 10/10/19 1247  . clonazePAM (KLONOPIN) tablet 2 mg  2 mg Oral Once [name], MD      . divalproex (DEPAKOTE) DR tablet 250 mg  250 mg Oral AC breakfast Sharma Covert, MD   250 mg at 10/16/19 0829  . divalproex (DEPAKOTE) DR tablet 500 mg  500 mg Oral Q12H [name], MD   500 mg at 10/16/19 0830  . hydrOXYzine (ATARAX/VISTARIL) tablet 25 mg  25 mg Oral TID PRN Connye Burkitt, NP   25 mg at 10/14/19 2010  . loratadine (CLARITIN) tablet 10 mg  10 mg Oral Daily PRN Mordecai Maes, NP   10 mg at 10/12/19 2306  . LORazepam (ATIVAN) tablet 2 mg  2 mg Oral Q6H PRN Connye Burkitt, NP   2 mg at 10/16/19 8453   Or  . LORazepam (ATIVAN) injection 2 mg  2 mg Intramuscular Q6H PRN Connye Burkitt, NP      . LORazepam (ATIVAN) injection 4 mg  4 mg Intramuscular Once [name], MD      . magnesium hydroxide (MILK OF MAGNESIA) suspension 30 mL  30 mL Oral Daily PRN Connye Burkitt, NP      . metoprolol succinate (TOPROL-XL) 24 hr tablet 100 mg  100 mg Oral Daily [name], MD   100 mg at 10/16/19 0830  . OLANZapine  zydis (ZYPREXA) disintegrating tablet 10 mg  10 mg Oral Q8H PRN Sharma Covert, MD   10 mg at 10/14/19 1616   And  . ziprasidone (GEODON) injection 20 mg  20 mg Intramuscular PRN Sharma Covert, MD      . risperiDONE (RISPERDAL) tablet 4 mg  4 mg Oral BID [name], MD   4 mg at 10/16/19 0830  . temazepam (RESTORIL) capsule 30 mg  30 mg Oral QHS [name], MD   30 mg at 10/15/19 2116  . traZODone (DESYREL) tablet 50 mg  50 mg Oral QHS PRN Connye Burkitt, NP   50 mg at 10/11/19 2345  . white petrolatum (VASELINE) gel   Topical PRN Dixon, Rashaun M, NP      . ziprasidone (GEODON) injection 20 mg  20 mg Intramuscular Q6H PRN Connye Burkitt, NP   20 mg at 10/14/19 0539   PTA Medications: Medications Prior to Admission  Medication Sig Dispense Refill Last Dose  . benztropine (COGENTIN) 1 MG tablet Take 1 tablet (1 mg total) by mouth 2 (two) times daily. (Patient not taking: Reported on 10/10/2019) 60 tablet  2 Not Taking at Unknown time  . divalproex (DEPAKOTE) 250 MG DR tablet 1 in am 2 at hs (Patient not taking: Reported on 10/10/2019) 90 tablet 2 Not Taking at Unknown time  . haloperidol (HALDOL) 10 MG tablet 1 in am 2 at h s (Patient not taking: Reported on 10/10/2019) 90 tablet 2 Not Taking at Unknown time  . haloperidol decanoate (HALDOL DECANOATE) 100 MG/ML injection Inject 1 mL (100 mg total) into the muscle every 30 (thirty) days. Due 7/8 (Patient not taking: Reported on 10/10/2019) 1 mL 11 Not Taking at Unknown time  . metoprolol succinate (TOPROL-XL) 50 MG 24 hr tablet Take 1 tablet (50 mg total) by mouth daily. (Patient not taking: Reported on 10/10/2019) 90 tablet 1 Not Taking at Unknown time  . pantoprazole (PROTONIX) 20 MG tablet Take 1 tablet (20 mg total) by mouth daily. (Patient not taking: Reported on 10/10/2019) 15 tablet 0 Not Taking at Unknown time  . temazepam (RESTORIL) 30 MG capsule Take 1 capsule (30 mg total) by mouth at bedtime. (Patient not taking: Reported on 10/10/2019) 30  capsule 0 Not Taking at Unknown time  . triamterene-hydrochlorothiazide (MAXZIDE-25) 37.5-25 MG tablet Take 1 tablet by mouth daily. (Patient not taking: Reported on 10/10/2019) 30 tablet 11 Not Taking at Unknown time    Patient Stressors: Health problems Traumatic event  Patient Strengths: Supportive family/friends  Treatment Modalities: Medication Management, Group therapy, Case management,  1 to 1 session with clinician, Psychoeducation, Recreational therapy.   Physician Treatment Plan for Primary Diagnosis: <principal problem not specified> Long Term Goal(s): Improvement in symptoms so as ready for discharge Improvement in symptoms so as ready for discharge   Short Term Goals: Ability to identify changes in lifestyle to reduce recurrence of condition will improve Ability to verbalize feelings will improve Ability to demonstrate self-control will improve Ability to identify and develop effective coping behaviors will improve Ability to maintain clinical measurements within normal limits will improve Compliance with prescribed medications will improve Ability to identify triggers associated with substance abuse/mental health issues will improve Ability to identify changes in lifestyle to reduce recurrence of condition will improve Ability to verbalize feelings will improve Ability to disclose and discuss suicidal ideas Ability to demonstrate self-control will improve  Medication Management: Evaluate patient's response, side effects, and tolerance of medication regimen.  Therapeutic Interventions: 1 to 1 sessions, Unit Group sessions and Medication administration.  Evaluation of Outcomes: Not Met  Physician Treatment Plan for Secondary Diagnosis: Active Problems:   Schizophrenia ([place])  Long Term Goal(s): Improvement in symptoms so as ready for discharge Improvement in symptoms so as ready for discharge   Short Term Goals: Ability to identify changes in lifestyle to reduce  recurrence of condition will improve Ability to verbalize feelings will improve Ability to demonstrate self-control will improve Ability to identify and develop effective coping behaviors will improve Ability to maintain clinical measurements within normal limits will improve Compliance with prescribed medications will improve Ability to identify triggers associated with substance abuse/mental health issues will improve Ability to identify changes in lifestyle to reduce recurrence of condition will improve Ability to verbalize feelings will improve Ability to disclose and discuss suicidal ideas Ability to demonstrate self-control will improve     Medication Management: Evaluate patient's response, side effects, and tolerance of medication regimen.  Therapeutic Interventions: 1 to 1 sessions, Unit Group sessions and Medication administration.  Evaluation of Outcomes: Not Met   RN Treatment Plan for Primary Diagnosis: <principal problem not specified> Long  Term Goal(s): Knowledge of disease and therapeutic regimen to maintain health will improve  Short Term Goals: Ability to participate in decision making will improve, Ability to verbalize feelings will improve, Ability to disclose and discuss suicidal ideas, Ability to identify and develop effective coping behaviors will improve and Compliance with prescribed medications will improve  Medication Management: RN will administer medications as ordered by provider, will assess and evaluate patient's response and provide education to patient for prescribed medication. RN will report any adverse and/or side effects to prescribing provider.  Therapeutic Interventions: 1 on 1 counseling sessions, Psychoeducation, Medication administration, Evaluate responses to treatment, Monitor vital signs and CBGs as ordered, Perform/monitor CIWA, COWS, AIMS and Fall Risk screenings as ordered, Perform wound care treatments as ordered.  Evaluation of Outcomes:  Not Met   LCSW Treatment Plan for Primary Diagnosis: <principal problem not specified> Long Term Goal(s): Safe transition to appropriate next level of care at discharge, Engage patient in therapeutic group addressing interpersonal concerns.  Short Term Goals: Engage patient in aftercare planning with referrals and resources and Increase skills for wellness and recovery  Therapeutic Interventions: Assess for all discharge needs, 1 to 1 time with Social worker, Explore available resources and support systems, Assess for adequacy in community support network, Educate family and significant other(s) on suicide prevention, Complete Psychosocial Assessment, Interpersonal group therapy.  Evaluation of Outcomes: Not Met   Progress in Treatment: Attending groups: No.  Participating in groups: No. Taking medication as prescribed: Yes. Toleration medication: Yes. Family/Significant other contact made: Yes, individual(s) contacted:  mother. Patient understands diagnosis: No. Discussing patient identified problems/goals with staff: Yes. Medical problems stabilized or resolved: Yes. Denies suicidal/homicidal ideation: Yes. Issues/concerns per patient self-inventory: No. Other:   New problem(s) identified: No, Describe:  none  New Short Term/Long Term Goal(s): Medication stabilization, elimination of SI thoughts, and development of a comprehensive mental wellness plan.   Patient Goals:  "get back on track"  Discharge Plan or Barriers: Established with [facility], referred to [facility] due to acuity and aggression toward staff  Reason for Continuation of Hospitalization: Aggression Delusions  Hallucinations Homicidal ideation Medication stabilization Suicidal ideation  Estimated Length of Stay: TBD  Attendees: Patient: Wendy Berry  10/11/2019   Physician: Dr. Jake Samples, MD 10/11/2019   Nursing: Gilberto Better., RN 10/11/2019   RN Care Manager: 10/11/2019   Social Worker: Lurline Idol, LCSW  10/11/2019    Recreational Therapist:  10/11/2019   Other: Ovidio Kin, MSW intern  10/11/2019   Other:  10/11/2019   Other: 10/11/2019     Scribe for Treatment Team: Joellen Jersey, [place] 10/16/2019 9:56 AM

## 2019-10-16 NOTE — Progress Notes (Signed)
Pt continues to talk to people not seen by staff, pt was appropriate first thing this morning

## 2019-10-17 MED ORDER — LORAZEPAM 2 MG/ML IJ SOLN: 4.0000 mg | Freq: Once | INTRAMUSCULAR | Status: DC

## 2019-10-17 MED ORDER — LORAZEPAM 1 MG PO TABS
2.0000 mg | ORAL_TABLET | Freq: Once | ORAL | Status: AC
  Administered 2019-10-17: 2 mg via ORAL

## 2019-10-17 MED ORDER — HALOPERIDOL 5 MG PO TABS
10.0000 mg | ORAL_TABLET | Freq: Three times a day (TID) | ORAL | Status: DC
  Administered 2019-10-17 – 2019-10-21 (×11): 10 mg via ORAL
  Filled 2019-10-17 (×19): qty 2

## 2019-10-17 NOTE — Progress Notes (Signed)
[facility] MD Progress Note  10/17/2019 10:06 AM Wendy Berry  MRN:  [id number] Subjective:   Patient remains very paranoid and spending a good deal of time on the phone talking about those who are stalking and harassing her, naming individuals so forth, has possibly even been calling a law firm and harassing them to some point.  She is also irritable intrusive and has a threatening demeanor with examiners.  No insight and is [facility]  Principal Problem: Chronic treatment resistant psychosis Diagnosis: Active Problems:   Schizophrenia ([place])  Total Time spent with patient: 20 minutes  Past Psychiatric History: see eval  Past Medical History:  Past Medical History:  Diagnosis Date  . Cerebral aneurysm   . Heart murmur   . Paranoid schizophrenia [facility])     Past Surgical History:  Procedure Laterality Date  . BRAIN SURGERY     Family History: History reviewed. No pertinent family history. Family Psychiatric  History: see eval Social History:  Social History   Substance and Sexual Activity  Alcohol Use No     Social History   Substance and Sexual Activity  Drug Use No    Social History   Socioeconomic History  . Marital status: Single    Spouse name: Not on file  . Number of children: Not on file  . Years of education: Not on file  . Highest education level: Not on file  Occupational History  . Not on file  Tobacco Use  . Smoking status: Never Smoker  . Smokeless tobacco: Never Used  Substance and Sexual Activity  . Alcohol use: No  . Drug use: No  . Sexual activity: Not on file  Other Topics Concern  . Not on file  Social History Narrative  . Not on file   Social Determinants of Health   Financial Resource Strain:   . Difficulty of Paying Living Expenses:   Food Insecurity:   . Worried About Charity fundraiser in the Last Year:   . Arboriculturist in the Last Year:   Transportation Needs:   . Film/video editor  (Medical):   Marland Kitchen Lack of Transportation (Non-Medical):   Physical Activity:   . Days of Exercise per Week:   . Minutes of Exercise per Session:   Stress:   . Feeling of Stress :   Social Connections:   . Frequency of Communication with Friends and Family:   . Frequency of Social Gatherings with Friends and Family:   . Attends Religious Services:   . Active Member of Clubs or Organizations:   . Attends Archivist Meetings:   Marland Kitchen Marital Status:    Additional Social History:    Pain Medications: Please see MAR Prescriptions: Please see MAR Over the Counter: Please see MAR History of alcohol / drug use?: No history of alcohol / drug abuse Longest period of sobriety (when/how long): Pt denies SA                    Sleep: Fair  Appetite:  Fair  Current Medications: Current Facility-Administered Medications  Medication Dose Route Frequency Provider Last Rate Last Admin  . acetaminophen (TYLENOL) tablet 650 mg  650 mg Oral Q6H PRN Connye Burkitt, NP   650 mg at 10/17/19 0504  . alum & mag hydroxide-simeth (MAALOX/MYLANTA) 200-200-20 MG/5ML suspension 30 mL  30 mL Oral Q4H PRN Connye Burkitt, NP      . amoxicillin (  AMOXIL) capsule 500 mg  500 mg Oral Q8H Antonieta Pert, MD   500 mg at 10/17/19 0803  . benztropine (COGENTIN) tablet 1 mg  1 mg Oral BID Malvin Johns, MD   1 mg at 10/17/19 0746  . clonazePAM (KLONOPIN) tablet 2 mg  2 mg Oral Once Malvin Johns, MD   Stopped at 10/10/19 1247  . clonazePAM (KLONOPIN) tablet 2 mg  2 mg Oral Once Malvin Johns, MD      . divalproex (DEPAKOTE) DR tablet 250 mg  250 mg Oral AC breakfast Antonieta Pert, MD   250 mg at 10/17/19 0746  . divalproex (DEPAKOTE) DR tablet 500 mg  500 mg Oral Q12H Malvin Johns, MD   500 mg at 10/16/19 0830  . haloperidol (HALDOL) tablet 10 mg  10 mg Oral TID Malvin Johns, MD      . hydrOXYzine (ATARAX/VISTARIL) tablet 25 mg  25 mg Oral TID PRN Aldean Baker, NP   25 mg at 10/17/19 0746  .  loratadine (CLARITIN) tablet 10 mg  10 mg Oral Daily PRN Denzil Magnuson, NP   10 mg at 10/16/19 1641  . LORazepam (ATIVAN) tablet 2 mg  2 mg Oral Q6H PRN Aldean Baker, NP   2 mg at 10/17/19 0746   Or  . LORazepam (ATIVAN) injection 2 mg  2 mg Intramuscular Q6H PRN Aldean Baker, NP      . LORazepam (ATIVAN) injection 4 mg  4 mg Intramuscular Once Malvin Johns, MD      . LORazepam (ATIVAN) injection 4 mg  4 mg Intramuscular Once Malvin Johns, MD      . magnesium hydroxide (MILK OF MAGNESIA) suspension 30 mL  30 mL Oral Daily PRN Aldean Baker, NP      . metoprolol succinate (TOPROL-XL) 24 hr tablet 100 mg  100 mg Oral Daily Malvin Johns, MD   100 mg at 10/17/19 0746  . OLANZapine zydis (ZYPREXA) disintegrating tablet 10 mg  10 mg Oral Q8H PRN Antonieta Pert, MD   10 mg at 10/17/19 0746   And  . ziprasidone (GEODON) injection 20 mg  20 mg Intramuscular PRN Antonieta Pert, MD      . temazepam (RESTORIL) capsule 30 mg  30 mg Oral QHS Malvin Johns, MD   30 mg at 10/15/19 2116  . traZODone (DESYREL) tablet 50 mg  50 mg Oral QHS PRN Aldean Baker, NP   50 mg at 10/11/19 2345  . white petrolatum (VASELINE) gel   Topical PRN Dixon, Rashaun M, NP      . ziprasidone (GEODON) injection 20 mg  20 mg Intramuscular Q6H PRN Aldean Baker, NP   20 mg at 10/14/19 0539    Lab Results: No results found for this or any previous visit (from the past 48 hour(s)).  Blood Alcohol level:  Lab Results  Component Value Date   ETH <10 10/10/2019   ETH <10 12/03/2018    Metabolic Disorder Labs: Lab Results  Component Value Date   HGBA1C 5.0 10/10/2019   MPG 97 10/10/2019   No results found for: PROLACTIN Lab Results  Component Value Date   CHOL 255 (H) 10/10/2019   TRIG 111 10/10/2019   HDL 51 10/10/2019   CHOLHDL 5.0 10/10/2019   VLDL 22 10/10/2019   LDLCALC 182 (H) 10/10/2019   LDLCALC 151 (H) 12/04/2018    Physical Findings: AIMS: Facial and Oral Movements Muscles of Facial  Expression: None, normal Lips  and Perioral Area: None, normal Jaw: None, normal Tongue: None, normal,Extremity Movements Upper (arms, wrists, hands, fingers): None, normal Lower (legs, knees, ankles, toes): None, normal, Trunk Movements Neck, shoulders, hips: None, normal, Overall Severity Severity of abnormal movements (highest score from questions above): None, normal Incapacitation due to abnormal movements: None, normal Patient's awareness of abnormal movements (rate only patient's report): No Awareness, Dental Status Current problems with teeth and/or dentures?: No Does patient usually wear dentures?: No  CIWA:  CIWA-Ar Total: 0 COWS:  COWS Total Score: 2  Musculoskeletal: Strength & Muscle Tone: within normal limits Gait & Station: normal Patient leans: N/A  Psychiatric Specialty Exam: Physical Exam  Review of Systems  Blood pressure (!) 136/94, pulse 96, temperature 98.2 F (36.8 C), temperature source Oral, resp. rate 20, SpO2 100 %.There is no height or weight on file to calculate BMI.  General Appearance: Disheveled  Eye Contact:  Minimal  Speech:  Pressured  Volume:  Increased  Mood:  Angry and Irritable  Affect:  Constricted  Thought Process:  Irrelevant and Descriptions of Associations: Loose  Orientation:  Full (Time, Place, and Person)  Thought Content:  Illogical, Delusions, Paranoid Ideation and Rumination  Suicidal Thoughts:  No  Homicidal Thoughts:  No  Memory:  Recent;   Poor Remote;   Fair  Judgement:  Impaired  Insight:  Lacking  Psychomotor Activity:  Increased  Concentration:  Concentration: Poor and Attention Span: Poor  Recall:  Poor  Fund of Knowledge:  Fair  Language:  Good  Akathisia:  Negative  Handed:  Right  AIMS (if indicated):     Assets:  Physical Health Resilience  ADL's:  Intact  Cognition:  WNL  Sleep:  Number of Hours: 10   Treatment Plan Summary: Daily contact with patient to assess and evaluate symptoms and progress in  treatment and Medication management use Ativan acutely for behavioral management add Haldol in anticipation of long-acting injectable/continue to monitor for safety   Jessalynn Mccowan, MD 10/17/2019, 10:06 AM

## 2019-10-17 NOTE — Progress Notes (Signed)
   10/17/19 2100  Psych Admission Type (Psych Patients Only)  Admission Status Involuntary  Psychosocial Assessment  Patient Complaints Anxiety  Eye Contact Glaring;Intense  Facial Expression Angry;Anxious  Affect Angry;Anxious;Apprehensive;Preoccupied;Threatening  Speech Aggressive;Argumentative;Pressured;Loud;Slurred  Interaction Attention-seeking;Demanding;Dominating;Hostile  Motor Activity Slow  Appearance/Hygiene Unremarkable  Behavior Characteristics Cooperative  Mood Anxious;Angry  Thought Process  Coherency Disorganized;Flight of ideas;Loose associations  Content Ambivalence;Obsessions;Preoccupation;Paranoia  Delusions Paranoid;Persecutory  Perception Hallucinations  Hallucination Auditory  Judgment Poor  Confusion Moderate  Danger to Self  Current suicidal ideation? Denies  Danger to Others  Danger to Others None reported or observed  Danger to Others Abnormal  Harmful Behavior to others Threats of violence towards other people observed or expressed

## 2019-10-17 NOTE — BHH Counselor (Signed)
CSW faxed progress notes documenting verbal and physical aggression, threatening behaviors, and active hallucinations to [facility] for review.  Patient's mother has called several times asking to speak with psychiatry to discuss medication concerns.  Enid Cutter, MSW, LCSW-A Clinical Social Worker [facility]

## 2019-10-17 NOTE — Plan of Care (Signed)
Progress note  D: pt found in bed; compliant with medication administration. Pt presents agitated, angry, tangential, and disorganized. Pt continues to use racial slurs on the unit. Pt has made multiple phone calls and is belligerent to the person on the other line. Pt was given PRN medication and is now resting. Pt denies si/hi/ah/vh and verbally agrees to approach staff if these become apparent or before harming themself/others while at bhh. Pt seems to still be responding to internal stimuli though. A: Pt provided support and encouragement. Pt given medication per protocol and standing orders. Q29m safety checks implemented and continued.  R: Pt safe on the unit. Will continue to monitor.  Pt progressing in the following metrics    Problem: Education: Goal: Utilization of techniques to improve thought processes will improve Outcome: Progressing   Problem: Activity: Goal: Interest or engagement in leisure activities will improve Outcome: Progressing   Problem: Role Relationship: Goal: Will demonstrate positive changes in social behaviors and relationships Outcome: Progressing   Problem: Safety: Goal: Ability to disclose and discuss suicidal ideas will improve Outcome: Progressing

## 2019-10-18 MED ORDER — PALIPERIDONE PALMITATE ER 156 MG/ML IM SUSY
156.0000 mg | PREFILLED_SYRINGE | Freq: Once | INTRAMUSCULAR | Status: AC
  Administered 2019-10-19: 156 mg via INTRAMUSCULAR
  Filled 2019-10-18: qty 1

## 2019-10-18 NOTE — Progress Notes (Signed)
SPIRITUALITY GROUP NOTE  Spirituality group facilitated by Wilkie Aye, MDiv, B[facility].  Group Description:  Group focused on topic of hope.  Patients participated in facilitated discussion around topic, connecting with one another around experiences and definitions for hope.  Group members engaged with visual explorer photos, reflecting on what hope looks like for them today.  Group engaged in discussion around how their definitions of hope are present today in hospital.   Modalities: Psycho-social ed, Adlerian, Narrative, MI Patient Progress: Wendy Berry came into group late and stayed for a few minutes before leaving.
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
## 2019-10-18 NOTE — B[facility] Group Notes (Signed)
Balance In Life 10/18/2019 10:30am  Type of Therapy/Topic:  Group Therapy:  Balance in Life  Participation Level:  Did Not Attend  Description of Group:   This group will address the concept of balance and how it feels and looks when one is unbalanced. Patients will be encouraged to process areas in their lives that are out of balance and identify reasons for remaining unbalanced. Facilitators will guide patients in utilizing problem-solving interventions to address and correct the stressor making their life unbalanced. Understanding and applying boundaries will be explored and addressed for obtaining and maintaining a balanced life. Patients will be encouraged to explore ways to assertively make their unbalanced needs known to significant others in their lives, using other group members and facilitator for support and feedback.  Therapeutic Goals: 1. Patient will identify two or more emotions or situations they have that consume much of in their lives. 2. Patient will identify signs/triggers that life has become out of balance:  3. Patient will identify two ways to set boundaries in order to achieve balance in their lives:  4. Patient will demonstrate ability to communicate their needs through discussion and/or role plays  Summary of Patient Progress:    Therapeutic Modalities:   Cognitive Behavioral Therapy Solution-Focused Therapy Assertiveness Training  Altha Sweitzer [place], LCSW  

## 2019-10-18 NOTE — Progress Notes (Signed)
   10/18/19 2120  Psych Admission Type (Psych Patients Only)  Admission Status Voluntary  Psychosocial Assessment  Patient Complaints None  Eye Contact Other (Comment) (pt in bed resting, avoids eye contact)  Facial Expression Other (Comment)  Affect Flat  Speech Soft  Interaction Isolative  Motor Activity Other (Comment)  Appearance/Hygiene Unremarkable  Behavior Characteristics Calm  Mood Preoccupied  Aggressive Behavior  Targets Other (Comment) (towards staff)  Type of Behavior Verbal;Unprovoked  Effect No apparent injury  Thought Process  Coherency Disorganized;Flight of ideas;Loose associations  Content Ambivalence;Obsessions;Preoccupation;Paranoia  Delusions Paranoid;Persecutory  Perception Hallucinations  Hallucination Auditory  Judgment Poor  Confusion Moderate  Danger to Self  Current suicidal ideation? Denies  Danger to Others  Danger to Others None reported or observed  Danger to Others Abnormal  Harmful Behavior to others Threats of violence towards other people observed or expressed   Destructive Behavior No threats or harm toward property

## 2019-10-18 NOTE — Progress Notes (Signed)
   10/18/19 1000  Psych Admission Type (Psych Patients Only)  Admission Status Involuntary  Psychosocial Assessment  Patient Complaints Irritability  Eye Contact Glaring;Intense  Facial Expression Angry;Anxious  Affect Angry;Anxious;Apprehensive;Preoccupied;Threatening  Speech Aggressive;Argumentative;Pressured;Loud;Slurred  Interaction Attention-seeking;Demanding;Dominating;Hostile  Motor Activity Slow  Appearance/Hygiene Unremarkable  Behavior Characteristics Guarded  Mood Preoccupied  Thought Process  Coherency Disorganized;Flight of ideas;Loose associations  Content Ambivalence;Obsessions;Preoccupation;Paranoia  Delusions Paranoid;Persecutory  Perception Hallucinations  Hallucination Auditory  Judgment Poor  Confusion Moderate  Danger to Self  Current suicidal ideation? Denies  Danger to Others  Danger to Others None reported or observed  Danger to Others Abnormal  Harmful Behavior to others Threats of violence towards other people observed or expressed

## 2019-10-18 NOTE — Progress Notes (Signed)
   10/18/19 2120  COVID-19 Daily Checkoff  Have you had a fever (temp > 37.80C/100F)  in the past 24 hours?  No  If you have had runny nose, nasal congestion, sneezing in the past 24 hours, has it worsened? No  COVID-19 EXPOSURE  Have you traveled outside the state in the past 14 days? No  Have you been living in the same home as a person with confirmed diagnosis of COVID-19 or a PUI (household contact)? No  Have you been diagnosed with COVID-19? No

## 2019-10-19 DIAGNOSIS — F200 Paranoid schizophrenia: Secondary | ICD-10-CM | POA: Diagnosis not present

## 2019-10-19 NOTE — Progress Notes (Signed)
   10/19/19 2115  COVID-19 Daily Checkoff  Have you had a fever (temp > 37.80C/100F)  in the past 24 hours?  No  If you have had runny nose, nasal congestion, sneezing in the past 24 hours, has it worsened? No  COVID-19 EXPOSURE  Have you traveled outside the state in the past 14 days? No  Have you been in contact with someone with a confirmed diagnosis of COVID-19 or PUI in the past 14 days without wearing appropriate PPE? No  Have you been living in the same home as a person with confirmed diagnosis of COVID-19 or a PUI (household contact)? No  Have you been diagnosed with COVID-19? No
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
## 2019-10-19 NOTE — Progress Notes (Signed)
Adult Psychoeducational Group Note  Date:  10/19/2019 Time:  10:07 PM  Group Topic/Focus:  Wrap-Up Group:   The focus of this group is to help patients review their daily goal of treatment and discuss progress on daily workbooks.  Participation Level:  Did Not Attend  Participation Quality:  Did Not Attend  Affect:  Did Not Attend  Cognitive:  Did Not Attend  Insight: None  Engagement in Group:  Did Not Attend  Modes of Intervention:  Did Not Attend  Additional Comments:  Pt did not attend evening wrap up group.  Felipa Furnace 10/19/2019, 10:07 PM

## 2019-10-19 NOTE — Progress Notes (Signed)
   10/19/19 2115  Psych Admission Type (Psych Patients Only)  Admission Status Voluntary  Psychosocial Assessment  Patient Complaints Other (Comment) (pt c/o knee pain )  Eye Contact Other (Comment);Fair  Facial Expression Blank;Flat  Affect Flat  Speech Soft;Slurred  Interaction Isolative  Motor Activity Slow  Appearance/Hygiene Unremarkable  Behavior Characteristics Cooperative  Mood Pleasant  Aggressive Behavior  Targets  (calm so far this shift)  Type of Behavior  (calm this shift so far)  Effect No apparent injury  Thought Chartered certified accountant of ideas  Content Obsessions;Preoccupation;Paranoia  Delusions Paranoid;Persecutory  Perception Hallucinations  Hallucination Auditory  Judgment Poor  Confusion Moderate  Danger to Self  Current suicidal ideation? Denies  Danger to Others  Danger to Others None reported or observed  Danger to Others Abnormal  Harmful Behavior to others No threats or harm toward other people  Destructive Behavior No threats or harm toward property
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
## 2019-10-20 DIAGNOSIS — F200 Paranoid schizophrenia: Secondary | ICD-10-CM | POA: Diagnosis not present

## 2019-10-20 NOTE — Progress Notes (Signed)
Adult Psychoeducational Group Note  Date:  10/20/2019 Time:  10:31 PM  Group Topic/Focus:  Wrap-Up Group:   The focus of this group is to help patients review their daily goal of treatment and discuss progress on daily workbooks.  Participation Level:  Did Not Attend  Participation Quality:  Did Not Attend  Affect:  Did Not Attend  Cognitive:  Did Not Attend  Insight: None  Engagement in Group:  Did Not Attend  Modes of Intervention:  Did Not Attend  Additional Comments:  Pt did not attend evening wrap up group tonight.  Felipa Furnace 10/20/2019, 10:31 PM

## 2019-10-20 NOTE — Plan of Care (Signed)
Progress note  D: pt found in bed; compliant with medication administration. Pt was heard yelling on the telephone. Pt then went back to their room and has been reclusive their room most of the day. Pt was pleasant with lunch time medication administration, saying thank you and asking for needs politely. Pt denies si/hi/ah/vh and verbally agrees to approach staff if these become apparent or before harming themself/others while at [facility].  A: Pt provided support and encouragement. Pt given medication per protocol and standing orders. Q23m safety checks implemented and continued.  R: Pt safe on the unit. Will continue to monitor.  Pt progressing in the following metrics  Problem: Education: Goal: Ability to state activities that reduce stress will improve Outcome: Progressing   Problem: Coping: Goal: Ability to identify and develop effective coping behavior will improve Outcome: Progressing   Problem: Education: Goal: Utilization of techniques to improve thought processes will improve Outcome: Progressing Goal: Knowledge of the prescribed therapeutic regimen will improve Outcome: Progressing
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
## 2019-10-21 MED ORDER — TRAZODONE HCL 150 MG PO TABS
150.0000 mg | ORAL_TABLET | Freq: Every day | ORAL | Status: DC
  Filled 2019-10-21: qty 1

## 2019-10-21 MED ORDER — BENZTROPINE MESYLATE 1 MG PO TABS: 1.0000 mg | ORAL_TABLET | Freq: Two times a day (BID) | ORAL | 2 refills | Status: AC

## 2019-10-21 MED ORDER — TRAZODONE HCL 150 MG PO TABS: 150.0000 mg | ORAL_TABLET | Freq: Every evening | ORAL | 11 refills | Status: AC | PRN

## 2019-10-21 MED ORDER — HALOPERIDOL 5 MG PO TABS
10.0000 mg | ORAL_TABLET | Freq: Every day | ORAL | Status: DC
  Filled 2019-10-21 (×2): qty 42

## 2019-10-21 MED ORDER — METOPROLOL SUCCINATE ER 50 MG PO TB24: 50.0000 mg | ORAL_TABLET | Freq: Every day | ORAL | 1 refills | Status: AC

## 2019-10-21 MED ORDER — DIVALPROEX SODIUM 500 MG PO DR TAB: 500.0000 mg | DELAYED_RELEASE_TABLET | Freq: Two times a day (BID) | ORAL | 3 refills | Status: AC

## 2019-10-21 MED ORDER — HALOPERIDOL 10 MG PO TABS: ORAL_TABLET | ORAL | 2 refills | Status: AC

## 2019-10-21 MED ORDER — TRIAMTERENE-HCTZ 37.5-25 MG PO TABS: 1.0000 | ORAL_TABLET | Freq: Every day | ORAL | 11 refills | Status: AC

## 2019-10-21 NOTE — Plan of Care (Signed)
Pt attended one recreation therapy group session in a calm and appropriate mood.   Yeily Link, LRT/CTRS 

## 2019-10-21 NOTE — Progress Notes (Signed)
Patient woke up after sleeping through most of the shift and requested a snack. Writer did not give her depakote and risperdal due to the closeness of the next doses due. She was very polite.

## 2019-10-21 NOTE — Progress Notes (Signed)
Patient has been asleep since shift change, no distress noted. Safety maintained with 15 min checks.

## 2019-10-21 NOTE — Progress Notes (Signed)
   10/20/19 2350  COVID-19 Daily Checkoff  Have you had a fever (temp > 37.80C/100F)  in the past 24 hours?  No  If you have had runny nose, nasal congestion, sneezing in the past 24 hours, has it worsened? No  COVID-19 EXPOSURE  Have you traveled outside the state in the past 14 days? No  Have you been in contact with someone with a confirmed diagnosis of COVID-19 or PUI in the past 14 days without wearing appropriate PPE? No  Have you been living in the same home as a person with confirmed diagnosis of COVID-19 or a PUI (household contact)? No  Have you been diagnosed with COVID-19? No

## 2019-10-21 NOTE — [facility] Counselor (Signed)
CSW left a voicemail to notify [facility] of patient's discharge.  Enid Cutter, MSW, LCSW-A Clinical Social Worker [facility]

## 2019-10-21 NOTE — Progress Notes (Signed)
Recreation Therapy Notes  INPATIENT RECREATION TR PLAN  Patient Details Name: Wendy Berry MRN: [id number] DOB: 07-27-1985 Today's Date: 10/21/2019  Rec Therapy Plan Is patient appropriate for Therapeutic Recreation?: Yes Treatment times per week: about 3 days Estimated Length of Stay: 5-7 days TR Treatment/Interventions: Group participation (Berry)  Discharge Criteria Pt will be discharged from therapy if:: Discharged Treatment plan/goals/alternatives discussed and agreed upon by:: Patient/family  Discharge Summary Short term goals set: See patient care plan Short term goals met: Adequate for discharge Progress toward goals comments: Groups attended Which groups?: Other (Berry)(Team building) Reason goals not met: Pt attended one group session. Therapeutic equipment acquired: N/Berry Reason patient discharged from therapy: Discharge from hospital Pt/family agrees with progress & goals achieved: Yes Date patient discharged from therapy: 10/21/19   Victorino Sparrow, LRT/CTRS  Wendy Berry, Wendy Berry 10/21/2019, 12:07 PM

## 2019-10-21 NOTE — Progress Notes (Signed)
[place] NOVEL CORONAVIRUS (COVID-19) DAILY CHECK-OFF SYMPTOMS - answer yes or no to each - every day NO YES  Have you had a fever in the past 24 hours?  . Fever (Temp > 37.80C / 100F) X   Have you had any of these symptoms in the past 24 hours? . New Cough .  Sore Throat  .  Shortness of Breath .  Difficulty Breathing .  Unexplained Body Aches   X   Have you had any one of these symptoms in the past 24 hours not related to allergies?   . Runny Nose .  Nasal Congestion .  Sneezing   X   If you have had runny nose, nasal congestion, sneezing in the past 24 hours, has it worsened?  X   EXPOSURES - check yes or no X   Have you traveled outside the state in the past 14 days?  X   Have you been in contact with someone with a confirmed diagnosis of COVID-19 or PUI in the past 14 days without wearing appropriate PPE?  X   Have you been living in the same home as a person with confirmed diagnosis of COVID-19 or a PUI (household contact)?    X   Have you been diagnosed with COVID-19?    X              What to do next: Answered NO to all: Answered YES to anything:   Proceed with unit schedule Follow the [facility] Inpatient Flowsheet.   D: Pt A & O X 3. Denies SI, HI, AVH and pain at this time. D/C home as ordered. Picked up in front of the facility by [employer].  A: D/C instructions reviewed with pt including prescriptions, medication samples and follow up appointment with ACTT team; compliance encouraged. All belongings from locker 07 returned to pt at time of departure. Scheduled medications given with verbal education and effects monitored. Safety checks maintained without incident till time of d/c.  R: Pt receptive to care. Compliant with medications when offered. Denies adverse drug reactions when assessed. Verbalized understanding related to d/c instructions. Signed belonging sheet in agreement with items received from locker. Ambulatory with a steady gait. Appears to be in no physical  distress at time of departure.

## 2019-10-21 NOTE — Tx Team (Signed)
Interdisciplinary Treatment and Diagnostic Plan Update  10/21/2019 Time of Session: 8:35am  Wendy Berry MRN: [id number]  Principal Diagnosis: <principal problem not specified>  Secondary Diagnoses: Active Problems:   Schizophrenia (HCC)   Current Medications:  Current Facility-Administered Medications  Medication Dose Route Frequency Provider Last Rate Last Admin  . acetaminophen (TYLENOL) tablet 650 mg  650 mg Oral Q6H PRN Aldean Baker, NP   650 mg at 10/19/19 2114  . alum & mag hydroxide-simeth (MAALOX/MYLANTA) 200-200-20 MG/5ML suspension 30 mL  30 mL Oral Q4H PRN Aldean Baker, NP      . benztropine (COGENTIN) tablet 1 mg  1 mg Oral BID Malvin Johns, MD   1 mg at 10/21/19 0738  . clonazePAM (KLONOPIN) tablet 2 mg  2 mg Oral Once Malvin Johns, MD   Stopped at 10/10/19 1247  . divalproex (DEPAKOTE) DR tablet 250 mg  250 mg Oral AC breakfast Antonieta Pert, MD   250 mg at 10/21/19 0739  . divalproex (DEPAKOTE) DR tablet 500 mg  500 mg Oral Q12H Malvin Johns, MD   500 mg at 10/21/19 0739  . haloperidol (HALDOL) tablet 10 mg  10 mg Oral TID Malvin Johns, MD   10 mg at 10/21/19 0738  . hydrOXYzine (ATARAX/VISTARIL) tablet 25 mg  25 mg Oral TID PRN Aldean Baker, NP   25 mg at 10/21/19 0738  . loratadine (CLARITIN) tablet 10 mg  10 mg Oral Daily PRN Denzil Magnuson, NP   10 mg at 10/19/19 1543  . LORazepam (ATIVAN) tablet 2 mg  2 mg Oral Q6H PRN Aldean Baker, NP   2 mg at 10/21/19 3716   Or  . LORazepam (ATIVAN) injection 2 mg  2 mg Intramuscular Q6H PRN Aldean Baker, NP      . LORazepam (ATIVAN) injection 4 mg  4 mg Intramuscular Once Malvin Johns, MD      . LORazepam (ATIVAN) injection 4 mg  4 mg Intramuscular Once Malvin Johns, MD      . magnesium hydroxide (MILK OF MAGNESIA) suspension 30 mL  30 mL Oral Daily PRN Aldean Baker, NP      . metoprolol succinate (TOPROL-XL) 24 hr tablet 100 mg  100 mg Oral Daily Malvin Johns, MD   100 mg at 10/21/19 0816  . OLANZapine  zydis (ZYPREXA) disintegrating tablet 10 mg  10 mg Oral Q8H PRN Antonieta Pert, MD   10 mg at 10/21/19 9678   And  . ziprasidone (GEODON) injection 20 mg  20 mg Intramuscular PRN Antonieta Pert, MD      . temazepam (RESTORIL) capsule 30 mg  30 mg Oral QHS Malvin Johns, MD   30 mg at 10/19/19 2111  . traZODone (DESYREL) tablet 50 mg  50 mg Oral QHS PRN Aldean Baker, NP   50 mg at 10/19/19 2111  . white petrolatum (VASELINE) gel   Topical PRN Dixon, Rashaun M, NP      . ziprasidone (GEODON) injection 20 mg  20 mg Intramuscular Q6H PRN Aldean Baker, NP   20 mg at 10/14/19 0539   PTA Medications: Medications Prior to Admission  Medication Sig Dispense Refill Last Dose  . benztropine (COGENTIN) 1 MG tablet Take 1 tablet (1 mg total) by mouth 2 (two) times daily. (Patient not taking: Reported on 10/10/2019) 60 tablet 2 Not Taking at Unknown time  . divalproex (DEPAKOTE) 250 MG DR tablet 1 in am 2 at hs (Patient not taking:  Reported on 10/10/2019) 90 tablet 2 Not Taking at Unknown time  . haloperidol (HALDOL) 10 MG tablet 1 in am 2 at h s (Patient not taking: Reported on 10/10/2019) 90 tablet 2 Not Taking at Unknown time  . haloperidol decanoate (HALDOL DECANOATE) 100 MG/ML injection Inject 1 mL (100 mg total) into the muscle every 30 (thirty) days. Due 7/8 (Patient not taking: Reported on 10/10/2019) 1 mL 11 Not Taking at Unknown time  . metoprolol succinate (TOPROL-XL) 50 MG 24 hr tablet Take 1 tablet (50 mg total) by mouth daily. (Patient not taking: Reported on 10/10/2019) 90 tablet 1 Not Taking at Unknown time  . pantoprazole (PROTONIX) 20 MG tablet Take 1 tablet (20 mg total) by mouth daily. (Patient not taking: Reported on 10/10/2019) 15 tablet 0 Not Taking at Unknown time  . temazepam (RESTORIL) 30 MG capsule Take 1 capsule (30 mg total) by mouth at bedtime. (Patient not taking: Reported on 10/10/2019) 30 capsule 0 Not Taking at Unknown time  . triamterene-hydrochlorothiazide (MAXZIDE-25) 37.5-25 MG  tablet Take 1 tablet by mouth daily. (Patient not taking: Reported on 10/10/2019) 30 tablet 11 Not Taking at Unknown time    Patient Stressors: Health problems Traumatic event  Patient Strengths: Supportive family/friends  Treatment Modalities: Medication Management, Group therapy, Case management,  1 to 1 session with clinician, Psychoeducation, Recreational therapy.   Physician Treatment Plan for Primary Diagnosis: <principal problem not specified> Long Term Goal(s): Improvement in symptoms so as ready for discharge Improvement in symptoms so as ready for discharge   Short Term Goals: Ability to identify changes in lifestyle to reduce recurrence of condition will improve Ability to verbalize feelings will improve Ability to demonstrate self-control will improve Ability to identify and develop effective coping behaviors will improve Ability to maintain clinical measurements within normal limits will improve Compliance with prescribed medications will improve Ability to identify triggers associated with substance abuse/mental health issues will improve Ability to identify changes in lifestyle to reduce recurrence of condition will improve Ability to verbalize feelings will improve Ability to disclose and discuss suicidal ideas Ability to demonstrate self-control will improve  Medication Management: Evaluate patient's response, side effects, and tolerance of medication regimen.  Therapeutic Interventions: 1 to 1 sessions, Unit Group sessions and Medication administration.  Evaluation of Outcomes: Adequate for Discharge  Physician Treatment Plan for Secondary Diagnosis: Active Problems:   Schizophrenia ([place])  Long Term Goal(s): Improvement in symptoms so as ready for discharge Improvement in symptoms so as ready for discharge   Short Term Goals: Ability to identify changes in lifestyle to reduce recurrence of condition will improve Ability to verbalize feelings will  improve Ability to demonstrate self-control will improve Ability to identify and develop effective coping behaviors will improve Ability to maintain clinical measurements within normal limits will improve Compliance with prescribed medications will improve Ability to identify triggers associated with substance abuse/mental health issues will improve Ability to identify changes in lifestyle to reduce recurrence of condition will improve Ability to verbalize feelings will improve Ability to disclose and discuss suicidal ideas Ability to demonstrate self-control will improve     Medication Management: Evaluate patient's response, side effects, and tolerance of medication regimen.  Therapeutic Interventions: 1 to 1 sessions, Unit Group sessions and Medication administration.  Evaluation of Outcomes: Adequate for Discharge   RN Treatment Plan for Primary Diagnosis: <principal problem not specified> Long Term Goal(s): Knowledge of disease and therapeutic regimen to maintain health will improve  Short Term Goals: Ability to participate in  decision making will improve, Ability to verbalize feelings will improve, Ability to disclose and discuss suicidal ideas, Ability to identify and develop effective coping behaviors will improve and Compliance with prescribed medications will improve  Medication Management: RN will administer medications as ordered by provider, will assess and evaluate patient's response and provide education to patient for prescribed medication. RN will report any adverse and/or side effects to prescribing provider.  Therapeutic Interventions: 1 on 1 counseling sessions, Psychoeducation, Medication administration, Evaluate responses to treatment, Monitor vital signs and CBGs as ordered, Perform/monitor CIWA, COWS, AIMS and Fall Risk screenings as ordered, Perform wound care treatments as ordered.  Evaluation of Outcomes: Adequate for Discharge   LCSW Treatment Plan for  Primary Diagnosis: <principal problem not specified> Long Term Goal(s): Safe transition to appropriate next level of care at discharge, Engage patient in therapeutic group addressing interpersonal concerns.  Short Term Goals: Engage patient in aftercare planning with referrals and resources and Increase skills for wellness and recovery  Therapeutic Interventions: Assess for all discharge needs, 1 to 1 time with Social worker, Explore available resources and support systems, Assess for adequacy in community support network, Educate family and significant other(s) on suicide prevention, Complete Psychosocial Assessment, Interpersonal group therapy.  Evaluation of Outcomes: Adequate for Discharge   Progress in Treatment: Attending groups: No.  Participating in groups: No. Taking medication as prescribed: Yes. Toleration medication: Yes. Family/Significant other contact made: Yes, individual(s) contacted:  mother. Patient understands diagnosis: No. Discussing patient identified problems/goals with staff: Yes. Medical problems stabilized or resolved: Yes. Denies suicidal/homicidal ideation: Yes. Issues/concerns per patient self-inventory: No. Other:   New problem(s) identified: No, Describe:  none  New Short Term/Long Term Goal(s): Medication stabilization, elimination of SI thoughts, and development of a comprehensive mental wellness plan.   Patient Goals:  "get back on track"  Discharge Plan or Barriers: Established with [facility], returning home.  Reason for Continuation of Hospitalization: Delusions  Medication stabilization  Estimated Length of Stay: discharge today  Attendees: Patient: Shanita Kanan  10/11/2019   Physician: Dr. Jeannine Kitten, MD 10/11/2019   Nursing: Adela Ports., RN 10/11/2019   RN Care Manager: 10/11/2019   Social Worker: Daleen Squibb, LCSW  Enid Cutter, LCSWA 10/11/2019   Recreational Therapist:  10/11/2019   Other: Earlyne Iba, MSW intern  10/11/2019   Other:  10/11/2019    Other: 10/11/2019     Scribe for Treatment Team: Darreld Mclean, LCSWA 10/21/2019 9:34 AM
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
## 2019-10-21 NOTE — [facility] Suicide Risk Assessment (Signed)
[facility] Discharge Suicide Risk Assessment   Principal Problem: psychosis Discharge Diagnoses: Active Problems:   Schizophrenia (HCC)   Total Time spent with patient: 45 minutes Musculoskeletal: Strength & Muscle Tone: within normal limits Gait & Station: normal Patient leans: N/A  Psychiatric Specialty Exam: Physical Exam  Review of Systems  Blood pressure 106/78, pulse (!) 110, temperature 98.6 F (37 C), resp. rate 20, SpO2 97 %.There is no height or weight on file to calculate BMI.  General Appearance: Casual  Eye Contact:  Fair  Speech:  Clear and Coherent  Volume:  Normal  Mood:  Euthymic  Affect:  Congruent  Thought Process:  Goal Directed  Orientation:  Full (Time, Place, and Person)  Thought Content:  Illogical and Paranoid Ideation though denied likely residual  Suicidal Thoughts:  No  Homicidal Thoughts:  No  Memory:  Immediate;   Fair Recent;   Fair  Judgement:  Fair  Insight:  Shallow  Psychomotor Activity:  Normal  Concentration:  Concentration: Fair and Attention Span: Fair  Recall:  [employer] of Knowledge:  Good  Language:  Fair  Akathisia:  Negative  Handed:  Right  AIMS (if indicated):     Assets:  Housing Physical Health Resilience Social Support  ADL's:  Intact  Cognition:  WNL  Sleep:  Number of Hours: 5.75   Mental Status Per Nursing Assessment::   On Admission:  Self-harm behaviors, Thoughts of violence towards others  Demographic Factors:  Unemployed  Loss Factors: Decrease in vocational status  Historical Factors: Impulsivity  Risk Reduction Factors:   Positive social support  Continued Clinical Symptoms:  Schizophrenia:   Paranoid or undifferentiated type  Cognitive Features That Contribute To Risk:  Closed-mindedness and Loss of executive function    Suicide Risk:  Minimal: No identifiable suicidal ideation.  Patients presenting with no risk factors but with morbid ruminations; may be classified as minimal risk based on  the severity of the depressive symptoms  Follow-up Information    [facility] Follow up.   Why: Your ACT Team will follow up with you in the community. [phone] (Crisis Line) Contact information: [address] [place] [address] [phone]           Plan Of Care/Follow-up recommendations:  Activity:  full  Chadrick Sprinkle, MD 10/21/2019, 10:48 AM

## 2019-10-21 NOTE — Progress Notes (Signed)
Recreation Therapy Notes  Date: 4.19.21 Time: 1000 Location: 500 Hall Dayroom  Group Topic: Coping Skills  Goal Area(s) Addresses:  Patients will identify positive coping skills. Patients will identify benefit of using coping skills post d/c.  Intervention: Worksheet, pencils  Activity: Web Design.  Patients were to identify situations, feelings, etc they feel have been holding them back and write those inside the web.  Patients then identified and wrote positive coping skills that could be used to combat these feelings and situations on the outside of the web.  Education: Coping Skills, Discharge Planning.   Education Outcome: Acknowledges understanding/In group clarification offered/Needs additional education.   Clinical Observations/Feedback: Pt did not attend group.     Leisa Gault, LRT/CTRS         Presten Joost A 10/21/2019 11:52 AM 

## 2019-10-21 NOTE — Progress Notes (Signed)
  [facility] Adult Case Management Discharge Plan :  Will you be returning to the same living situation after discharge:  Yes,  home. At discharge, do you have transportation home?: Yes,  Safe Transportation at 11am. Do you have the ability to pay for your medications: Yes,  has Medicare.  Release of information consent forms completed and in the chart;  Patient's signature needed at discharge.  Patient to Follow up at: Follow-up Information    [facility] Follow up.   Why: Your ACT Team will follow up with you in the community. [phone] (Crisis Line) Contact information: [address] [place] [address] [phone]           Next level of care provider has access to [facility] Link:no  Safety Planning and Suicide Prevention discussed: Yes,  with mother.  Has patient been referred to the Quitline?: Patient refused referral  Patient has been referred for addiction treatment: Yes  Darreld Mclean, LCSWA 10/21/2019, 9:43 AM

## 2019-12-18 ENCOUNTER — Encounter (HOSPITAL_COMMUNITY): Payer: Self-pay | Admitting: Emergency Medicine

## 2019-12-18 ENCOUNTER — Other Ambulatory Visit: Payer: Self-pay

## 2019-12-18 ENCOUNTER — Ambulatory Visit (HOSPITAL_COMMUNITY)
Admission: EM | Admit: 2019-12-18 | Discharge: 2019-12-18 | Disposition: A | Discharge status: Home / Self Care | Payer: Medicare Other | Attending: Emergency Medicine | Admitting: Emergency Medicine

## 2019-12-18 DIAGNOSIS — Z3202 Encounter for pregnancy test, result negative: Secondary | ICD-10-CM | POA: Diagnosis not present

## 2019-12-18 DIAGNOSIS — R197 Diarrhea, unspecified: Secondary | ICD-10-CM | POA: Diagnosis present

## 2019-12-18 DIAGNOSIS — R1110 Vomiting, unspecified: Secondary | ICD-10-CM | POA: Diagnosis not present

## 2019-12-18 DIAGNOSIS — R112 Nausea with vomiting, unspecified: Secondary | ICD-10-CM | POA: Diagnosis present

## 2019-12-18 LAB — COMPREHENSIVE METABOLIC PANEL
ALT: 25 U/L (ref 0–44)
AST: 22 U/L (ref 15–41)
Albumin: 3.3 g/dL — ABNORMAL LOW (ref 3.5–5.0)
Alkaline Phosphatase: 81 U/L (ref 38–126)
Anion gap: 9 (ref 5–15)
BUN: 8 mg/dL (ref 6–20)
CO2: 20 mmol/L — ABNORMAL LOW (ref 22–32)
Calcium: 8.8 mg/dL — ABNORMAL LOW (ref 8.9–10.3)
Chloride: 105 mmol/L (ref 98–111)
Creatinine, Ser: 0.85 mg/dL (ref 0.44–1.00)
GFR calc Af Amer: >60 mL/min (ref >60)
GFR calc non Af Amer: >60 mL/min (ref >60)
Glucose, Bld: 115 mg/dL — ABNORMAL HIGH (ref 70–99)
Potassium: 4.3 mmol/L (ref 3.5–5.1)
Sodium: 134 mmol/L — ABNORMAL LOW (ref 135–145)
Total Bilirubin: 0.8 mg/dL (ref 0.3–1.2)
Total Protein: 7.9 g/dL (ref 6.5–8.1)

## 2019-12-18 LAB — POC URINE PREG, ED: Preg Test, Ur: NEGATIVE

## 2019-12-18 LAB — POCT URINALYSIS DIP (DEVICE)
Glucose, UA: NEGATIVE mg/dL
Hgb urine dipstick: NEGATIVE
Ketones, ur: NEGATIVE mg/dL
Leukocytes,Ua: NEGATIVE
Nitrite: NEGATIVE
Protein, ur: 100 mg/dL — AB
Specific Gravity, Urine: >=1.03 (ref 1.005–1.030)
Urobilinogen, UA: 0.2 mg/dL (ref 0.0–1.0)
pH: 5 (ref 5.0–8.0)

## 2019-12-18 LAB — CBC WITH DIFFERENTIAL/PLATELET
Abs Immature Granulocytes: 0.05 10*3/uL (ref 0.00–0.07)
Basophils Absolute: 0 10*3/uL (ref 0.0–0.1)
Basophils Relative: 0 %
Eosinophils Absolute: 0 10*3/uL (ref 0.0–0.5)
Eosinophils Relative: 0 %
HCT: 41.8 % (ref 36.0–46.0)
Hemoglobin: 13.2 g/dL (ref 12.0–15.0)
Immature Granulocytes: 1 %
Lymphocytes Relative: 7 %
Lymphs Abs: 0.8 10*3/uL (ref 0.7–4.0)
MCH: 27.6 pg (ref 26.0–34.0)
MCHC: 31.6 g/dL (ref 30.0–36.0)
MCV: 87.3 fL (ref 80.0–100.0)
Monocytes Absolute: 0.5 10*3/uL (ref 0.1–1.0)
Monocytes Relative: 5 %
Neutro Abs: 9.3 10*3/uL — ABNORMAL HIGH (ref 1.7–7.7)
Neutrophils Relative %: 87 %
Platelets: 361 10*3/uL (ref 150–400)
RBC: 4.79 MIL/uL (ref 3.87–5.11)
RDW: 13.8 % (ref 11.5–15.5)
WBC: 10.6 10*3/uL — ABNORMAL HIGH (ref 4.0–10.5)
nRBC: 0 % (ref 0.0–0.2)

## 2019-12-18 LAB — LIPASE, BLOOD: Lipase: 32 U/L (ref 11–51)

## 2019-12-18 MED ORDER — ONDANSETRON 4 MG PO TBDP
4.0000 mg | ORAL_TABLET | Freq: Once | ORAL | Status: AC
  Administered 2019-12-18: 4 mg via ORAL

## 2019-12-18 MED ORDER — ONDANSETRON 4 MG PO TBDP: 4.0000 mg | ORAL_TABLET | Freq: Three times a day (TID) | ORAL | 0 refills | Status: AC | PRN

## 2019-12-18 MED ORDER — NAPROXEN 500 MG PO TABS: 500.0000 mg | ORAL_TABLET | Freq: Two times a day (BID) | ORAL | 0 refills | Status: AC | PRN

## 2019-12-18 MED ORDER — OMEPRAZOLE 20 MG PO CPDR: 20.0000 mg | DELAYED_RELEASE_CAPSULE | Freq: Two times a day (BID) | ORAL | 0 refills | Status: AC

## 2019-12-18 MED ORDER — ONDANSETRON 4 MG PO TBDP
ORAL_TABLET | ORAL | Status: AC
  Filled 2019-12-18: qty 1

## 2019-12-18 NOTE — ED Triage Notes (Signed)
Pt c/o nausea and vomiting with headache x 1 year. She states she is sensitive to smell and after eating she feels sick. She states if she doesn't vomit that she will have diarrhea. She states this is an intermittent problem that happens every other week. Pt has been vomiting in the lobby today.

## 2019-12-18 NOTE — ED Notes (Signed)
Pt states she missed the cup when trying to void. She will try again later. She has ginger ale that she is drinking.

## 2019-12-18 NOTE — Discharge Instructions (Signed)
Begin Zofran as needed for nausea/vomiting Begin omeprazole twice daily for the next 2 weeks to help with underlying acid or gastritis Naprosyn twice daily with food as needed for headaches Blood work pending-I will call if this is abnormal Please follow-up with gastroenterology for further evaluation of recurrent symptoms If developing worsening abdominal pain or symptoms please follow-up in emergency room

## 2019-12-18 NOTE — ED Provider Notes (Signed)
[name]    CSN: [id number] Arrival date & time: 12/18/19  1028      History   Chief Complaint Chief Complaint  Patient presents with  . Emesis  . Diarrhea    HPI Wendy Berry is a 34 y.o. female history of schizophrenia, presenting today for evaluation of episodic nausea and vomiting.  Patient reports that over the past year she has had episodes of feeling very nauseous with vomiting, occasional diarrhea.  Symptoms will last for few hours and then resolved.  Tolerates oral intake in between.  She denies significant abdominal pain.  Denies chest pain or shortness of breath.  Denies any URI symptoms.  Denies pelvic pain or vaginal discharge.  Denies urinary symptoms.  Last menstrual cycle was 2 weeks ago.  Recently started birth control.  Denies prior abdominal surgeries.  Denies history of GI issues.  She is unsure of any specific triggers, occasionally will notice that when she is going places, in the car, but is also happened at home.-Denies blood in stool or vomit.  HPI  Past Medical History:  Diagnosis Date  . Cerebral aneurysm   . Heart murmur   . Paranoid schizophrenia [facility])     Patient Active Problem List   Diagnosis Date Noted  . Schizophrenia ([place]) 10/09/2019  . Psychotic disorder ([place]) 12/04/2018  . 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 BRAIN SURGERY      OB History   No obstetric history on file.      Home Medications    Prior to Admission medications   Medication Sig Start Date End Date Taking? Authorizing Provider  benztropine (COGENTIN) 1 MG tablet Take 1 tablet (1 mg total) by mouth 2 (two) times daily. 10/21/19 10/20/20  [name], MD  divalproex (DEPAKOTE) 500 MG DR tablet Take 1 tablet (500 mg total) by mouth every 12 (twelve) hours. 10/21/19   [name], MD  haloperidol (HALDOL) 10 MG tablet 1 in am 2 at hs 10/21/19   [name], MD  metoprolol succinate  (TOPROL-XL) 50 MG 24 hr tablet Take 1 tablet (50 mg total) by mouth daily. 10/21/19   [name], MD  naproxen (NAPROSYN) 500 MG tablet Take 1 tablet (500 mg total) by mouth 2 (two) times daily as needed for headache. With food 12/18/19   Maleka Contino C, PA-C  omeprazole (PRILOSEC) 20 MG capsule Take 1 capsule (20 mg total) by mouth 2 (two) times daily before a meal for 14 days. 12/18/19 01/01/20  Beatriz Quintela C, PA-C  ondansetron (ZOFRAN ODT) 4 MG disintegrating tablet Take 1 tablet (4 mg total) by mouth every 8 (eight) hours as needed for nausea or vomiting. 12/18/19   Kaileen Bronkema C, PA-C  traZODone (DESYREL) 150 MG tablet Take 1 tablet (150 mg total) by mouth at bedtime as needed for sleep. 10/21/19   [name], MD  triamterene-hydrochlorothiazide (MAXZIDE-25) 37.5-25 MG tablet Take 1 tablet by mouth daily. 10/21/19   [name], MD  pantoprazole (PROTONIX) 20 MG tablet Take 1 tablet (20 mg total) by mouth daily. Patient not taking: Reported on 10/10/2019 01/03/19 12/18/19  Mesner, Corene Cornea, MD    Family History Family History  Family history unknown: Yes    Social History Social History   Tobacco Use  . Smoking status: Never Smoker  . Smokeless tobacco: Never Used  Substance Use Topics  . Alcohol use: No  . Drug use: No  Allergies   Patient has no known allergies.   Review of Systems Review of Systems  Constitutional: Negative for fever.  Respiratory: Negative for shortness of breath.   Cardiovascular: Negative for chest pain.  Gastrointestinal: Positive for abdominal pain, diarrhea, nausea and vomiting.  Genitourinary: Negative for dysuria, flank pain, genital sores, hematuria, menstrual problem, vaginal bleeding, vaginal discharge and vaginal pain.  Musculoskeletal: Negative for back pain.  Skin: Negative for rash.  Neurological: Positive for headaches. Negative for dizziness and light-headedness.     Physical Exam Triage Vital Signs ED Triage Vitals  Enc  Vitals Group     BP 12/18/19 1123 137/87     Pulse Rate 12/18/19 1123 (!) 112     Resp 12/18/19 1113 18     Temp 12/18/19 1113 98.2 F (36.8 C)     Temp Source 12/18/19 1113 Oral     SpO2 12/18/19 1113 98 %     Weight --      Height --      Head Circumference --      Peak Flow --      Pain Score 12/18/19 1122 9     Pain Loc --      Pain Edu? --      Excl. in GC? --    No data found.  Updated Vital Signs BP 137/87 (BP Location: Right Arm)   Pulse (!) 112   Temp 98.2 F (36.8 C) (Oral)   Resp 18   LMP 12/04/2019   SpO2 98%   Visual Acuity Right Eye Distance:   Left Eye Distance:   Bilateral Distance:    Right Eye Near:   Left Eye Near:    Bilateral Near:     Physical Exam Vitals and nursing note reviewed.  Constitutional:      Appearance: She is well-developed.     Comments: No acute distress  HENT:     Head: Normocephalic and atraumatic.     Nose: Nose normal.     Mouth/Throat:     Comments: Oral mucosa pink and moist, no tonsillar enlargement or exudate. Posterior pharynx patent and nonerythematous, no uvula deviation or swelling. Normal phonation. Eyes:     Conjunctiva/sclera: Conjunctivae normal.  Cardiovascular:     Rate and Rhythm: Tachycardia present.  Pulmonary:     Effort: Pulmonary effort is normal. No respiratory distress.     Comments: Breathing comfortably at rest, CTABL, no wheezing, rales or other adventitious sounds auscultated Abdominal:     General: There is no distension.     Comments: Soft, nondistended, mild tenderness palpation epigastrium and mid upper abdomen, nontender to palpation of bilateral lower quadrants  Musculoskeletal:        General: Normal range of motion.     Cervical back: Neck supple.  Skin:    General: Skin is warm and dry.  Neurological:     Mental Status: She is alert and oriented to person, place, and time.      UC Treatments / Results  Labs (all labs ordered are listed, but only abnormal results are  displayed) Labs Reviewed  POCT URINALYSIS DIP (DEVICE) - Abnormal; Notable for the following components:      Result Value   Bilirubin Urine SMALL (*)    Protein, ur 100 (*)    All other components within normal limits  CBC WITH DIFFERENTIAL/PLATELET  COMPREHENSIVE METABOLIC PANEL  LIPASE, BLOOD  POC URINE PREG, ED    EKG   Radiology No results found.  Procedures  Procedures (including critical care time)  Medications Ordered in UC Medications  ondansetron (ZOFRAN-ODT) disintegrating tablet 4 mg (4 mg Oral Given 12/18/19 1131)    Initial Impression / Assessment and Plan / UC Course  I have reviewed the triage vital signs and the nursing notes.  Pertinent labs & imaging results that were available during my care of the patient were reviewed by me and considered in my medical decision making (see chart for details).     Episodic nausea and vomiting x1 year, intermittent throughout the week.  Provided Zofran in clinic.  Pregnancy negative.  Checking CBC, CMP and lipase.  Given intermittent nature and length of symptoms recommending further evaluation by gastroenterology.  Recommend symptomatic and supportive care today, providing Zofran to use as needed, initiating on 2-week trial of PPI along with Naprosyn for headache management.  Rest and fluids.  Discussed strict return precautions. Patient verbalized understanding and is agreeable with plan.  Final Clinical Impressions(s) / UC Diagnoses   Final diagnoses:  Nausea vomiting and diarrhea     Discharge Instructions     Begin Zofran as needed for nausea/vomiting Begin omeprazole twice daily for the next 2 weeks to help with underlying acid or gastritis Naprosyn twice daily with food as needed for headaches Blood work pending-I will call if this is abnormal Please follow-up with gastroenterology for further evaluation of recurrent symptoms If developing worsening abdominal pain or symptoms please follow-up in emergency  room    ED Prescriptions    Medication Sig Dispense Auth. Provider   omeprazole (PRILOSEC) 20 MG capsule Take 1 capsule (20 mg total) by mouth 2 (two) times daily before a meal for 14 days. 28 capsule Emine Lopata C, PA-C   ondansetron (ZOFRAN ODT) 4 MG disintegrating tablet Take 1 tablet (4 mg total) by mouth every 8 (eight) hours as needed for nausea or vomiting. 20 tablet Dwana Garin C, PA-C   naproxen (NAPROSYN) 500 MG tablet Take 1 tablet (500 mg total) by mouth 2 (two) times daily as needed for headache. With food 30 tablet Kaylee Wombles, Rosalia C, PA-C     PDMP not reviewed this encounter.   Lew Dawes, PA-C 12/18/19 1227

## 2022-04-21 ENCOUNTER — Emergency Department (HOSPITAL_COMMUNITY)
Admission: EM | Admit: 2022-04-21 | Discharge: 2022-04-21 | Disposition: A | Discharge status: Home / Self Care | Payer: Medicare Other | Attending: Emergency Medicine | Admitting: Emergency Medicine

## 2022-04-21 ENCOUNTER — Other Ambulatory Visit: Payer: Self-pay

## 2022-04-21 DIAGNOSIS — H9201 Otalgia, right ear: Secondary | ICD-10-CM | POA: Diagnosis present

## 2022-04-21 MED ORDER — IBUPROFEN 600 MG PO TABS: 600.0000 mg | ORAL_TABLET | Freq: Four times a day (QID) | ORAL | 0 refills | Status: AC | PRN

## 2022-04-21 MED ORDER — AMOXICILLIN 400 MG/5ML PO SUSR: 50.0000 mg/kg/d | Freq: Three times a day (TID) | ORAL | 0 refills | Status: AC

## 2022-04-21 NOTE — ED Provider Notes (Signed)
[facility] EMERGENCY DEPARTMENT Provider Note   CSN: [id number] Arrival date & time: 04/21/22  4098     History  Chief Complaint  Patient presents with   Otalgia    Wendy Berry is a 36 y.o. female.   Otalgia    36 year old female with history of schizophrenia, cerebral aneurysm, heart murmur, presenting complaining of ear pain.  Patient endorses right ear pain ongoing for the past week.  Increasing pain when she lays on the affected side.  She endorsed occasional drainage, from her ear.  States it felt similar to prior infection.  She does not endorse any fever, headache, congestion, sneezing coughing dental pain or neck pain.  She tries over-the-counter swimmer's ear medication at home without relief.  Home Medications Prior to Admission medications   Medication Sig Start Date End Date Taking? Authorizing Provider  benztropine (COGENTIN) 1 MG tablet Take 1 tablet (1 mg total) by mouth 2 (two) times daily. 10/21/19 10/20/20  [name], MD  divalproex (DEPAKOTE) 500 MG DR tablet Take 1 tablet (500 mg total) by mouth every 12 (twelve) hours. 10/21/19   [name], MD  haloperidol (HALDOL) 10 MG tablet 1 in am 2 at hs 10/21/19   [name], MD  metoprolol succinate (TOPROL-XL) 50 MG 24 hr tablet Take 1 tablet (50 mg total) by mouth daily. 10/21/19   [name], MD  naproxen (NAPROSYN) 500 MG tablet Take 1 tablet (500 mg total) by mouth 2 (two) times daily as needed for headache. With food 12/18/19   Wieters, Hallie C, PA-C  omeprazole (PRILOSEC) 20 MG capsule Take 1 capsule (20 mg total) by mouth 2 (two) times daily before a meal for 14 days. 12/18/19 01/01/20  Wieters, Hallie C, PA-C  ondansetron (ZOFRAN ODT) 4 MG disintegrating tablet Take 1 tablet (4 mg total) by mouth every 8 (eight) hours as needed for nausea or vomiting. 12/18/19   Wieters, Hallie C, PA-C  traZODone (DESYREL) 150 MG tablet Take 1 tablet (150 mg total) by mouth at bedtime as needed for  sleep. 10/21/19   [name], MD  triamterene-hydrochlorothiazide (MAXZIDE-25) 37.5-25 MG tablet Take 1 tablet by mouth daily. 10/21/19   [name], MD  pantoprazole (PROTONIX) 20 MG tablet Take 1 tablet (20 mg total) by mouth daily. Patient not taking: Reported on 10/10/2019 01/03/19 12/18/19  Mesner, Corene Cornea, MD      Allergies    Patient has no known allergies.    Review of Systems   Review of Systems  HENT:  Positive for ear pain.   All other systems reviewed and are negative.   Physical Exam Updated Vital Signs BP (!) 186/140   Pulse 99   Temp 98.9 F (37.2 C) (Oral)   Resp 16   Ht 5\' 4"  (1.626 m)   Wt 86.2 kg   SpO2 100%   BMI 32.61 kg/m  Physical Exam Vitals and nursing note reviewed.  Constitutional:      General: She is not in acute distress.    Appearance: She is well-developed.  HENT:     Head: Atraumatic.     Ears:     Comments: Ears: Cerumen impaction noted to bilateral ears obscuring TM.  Ear canal normal without any tenderness or edema or erythema.  No change in hearing.    Nose: Nose normal.     Mouth/Throat:     Mouth: Mucous membranes are moist.  Eyes:     Conjunctiva/sclera: Conjunctivae normal.  Pulmonary:  Effort: Pulmonary effort is normal.  Musculoskeletal:     Cervical back: Neck supple.  Skin:    Findings: No rash.  Neurological:     Mental Status: She is alert.  Psychiatric:        Mood and Affect: Mood normal.     ED Results / Procedures / Treatments   Labs (all labs ordered are listed, but only abnormal results are displayed) Labs Reviewed - No data to display  EKG None  Radiology No results found.  Procedures Procedures    Medications Ordered in ED Medications - No data to display  ED Course/ Medical Decision Making/ A&P                           Medical Decision Making  BP (!) 186/140   Pulse 99   Temp 98.9 F (37.2 C) (Oral)   Resp 16   Ht 5\' 4"  (1.626 m)   Wt 86.2 kg   SpO2 100%   BMI 32.61 kg/m    31:76 AM  36 year old female with history of schizophrenia, cerebral aneurysm, heart murmur, presenting complaining of ear pain.  Patient endorses right ear pain ongoing for the past week.  Increasing pain when she lays on the affected side.  She endorsed occasional drainage, from her ear.  States it felt similar to prior infection.  She does not endorse any fever, headache, congestion, sneezing coughing dental pain or neck pain.  She tries over-the-counter swimmer's ear medication at home without relief.  She denies any tinnitus, denies any hearing changes or hearing loss.  On exam, patient has cerumen impaction to both ears and I am unable to visualize the TM.  She does not have any signs suggestive of otitis externa.  She does not have any dental pain.  Patient requesting for amoxicillin as this has helped her in the past.  In the setting of prolonged ear discomfort for more than a week as well as my inability to visualize the TM, I have agreed to prescribe amoxicillin as treatment.  Please note her blood pressure is elevated at 186/140 but patient does not exhibit symptoms concerning for hypertensive emergency.  I also have low suspicion for dissection or other acute emergent medical condition.  Return precaution given.        Final Clinical Impression(s) / ED Diagnoses Final diagnoses:  Otalgia of right ear    Rx / DC Orders ED Discharge Orders          Ordered    amoxicillin (AMOXIL) 400 MG/5ML suspension  3 times daily        04/21/22 0740    ibuprofen (ADVIL) 600 MG tablet  Every 6 hours PRN        04/21/22 0740              04/23/22, PA-C 04/21/22 0741    Long, 04/23/22, MD 04/21/22 1130

## 2022-04-21 NOTE — ED Triage Notes (Signed)
Pt. Stated, Ive had rt. Ear pain for one week. No other symptoms.

## 2022-04-21 NOTE — Discharge Instructions (Addendum)
You may take antibiotic prescribed however if you take it for 2 to 3 days and you notice no improvement then discontinue the medication.  Follow-up with your doctor for further care.  Return if you have any concern.

## 2022-04-23 ENCOUNTER — Other Ambulatory Visit: Payer: Self-pay

## 2022-04-23 ENCOUNTER — Encounter (HOSPITAL_COMMUNITY): Payer: Self-pay

## 2022-04-23 ENCOUNTER — Emergency Department (HOSPITAL_COMMUNITY)
Admission: EM | Admit: 2022-04-23 | Discharge: 2022-04-23 | Disposition: A | Discharge status: Home / Self Care | Payer: Medicare Other | Attending: Student | Admitting: Student

## 2022-04-23 DIAGNOSIS — H9201 Otalgia, right ear: Secondary | ICD-10-CM

## 2022-04-23 DIAGNOSIS — H6123 Impacted cerumen, bilateral: Secondary | ICD-10-CM | POA: Diagnosis not present

## 2022-04-23 MED ORDER — OFLOXACIN 0.3 % OT SOLN: 5.0000 [drp] | Freq: Two times a day (BID) | OTIC | 0 refills | Status: AC

## 2022-04-23 MED ORDER — CARBAMIDE PEROXIDE 6.5 % OT SOLN: 5.0000 [drp] | Freq: Two times a day (BID) | OTIC | 0 refills | Status: AC

## 2022-04-23 MED ORDER — CEPACOL REGULAR STRENGTH 3 MG MT LOZG: 1.0000 | LOZENGE | OROMUCOSAL | 12 refills | Status: AC | PRN

## 2022-04-23 NOTE — Discharge Instructions (Signed)
You were seen in the emergency department for ear ache. Please continue taking your augmentin. I have prescribed you eardrops called Debrox which helped to soften the earwax in your ears.  Please use this in both of your ears daily over the next week.  Additionally I am prescribing you eardrops that you will also use in your right ear twice a day over the next week.  Please return for worsening symptoms or please go to the [facility] which I have placed in your discharge paperwork for follow-up of your ear pain.

## 2022-04-23 NOTE — ED Provider Notes (Signed)
[facility] EMERGENCY DEPARTMENT Provider Note   CSN: [id number] Arrival date & time: 04/23/22  0840     History  Chief Complaint  Patient presents with   Otalgia    Wendy Berry is a 36 y.o. female.  With past medical history schizophrenia who presents to the emergency department with otalgia.  Patient states that her right ear has been hurting her for the past week.  She states that now she is having some right-sided sore throat.  She denies having swelling to the ear, cough, fever, decreased hearing or tinnitus, ear drainage, rhinorrhea, sick contacts.  Denies recent swimming or submerging the ear such as in a bathtub.  HPI     Home Medications Prior to Admission medications   Medication Sig Start Date End Date Taking? Authorizing Provider  carbamide peroxide (DEBROX) 6.5 % OTIC solution Place 5 drops into both ears 2 (two) times daily for 5 days. 04/23/22 04/28/22 Yes Cristopher Peru, PA-C  menthol-cetylpyridinium (CEPACOL REGULAR STRENGTH) 3 MG lozenge Take 1 lozenge (3 mg total) by mouth as needed for sore throat. 04/23/22  Yes Cristopher Peru, PA-C  ofloxacin (FLOXIN) 0.3 % OTIC solution Place 5 drops into the right ear 2 (two) times daily. 04/23/22  Yes Cristopher Peru, PA-C  amoxicillin (AMOXIL) 400 MG/5ML suspension Take 18 mLs (1,440 mg total) by mouth 3 (three) times daily for 5 days. 04/21/22 04/26/22  Fayrene Helper, PA-C  benztropine (COGENTIN) 1 MG tablet Take 1 tablet (1 mg total) by mouth 2 (two) times daily. 10/21/19 10/20/20  Malvin Johns, MD  divalproex (DEPAKOTE) 500 MG DR tablet Take 1 tablet (500 mg total) by mouth every 12 (twelve) hours. 10/21/19   Malvin Johns, MD  haloperidol (HALDOL) 10 MG tablet 1 in am 2 at hs 10/21/19   Malvin Johns, MD  ibuprofen (ADVIL) 600 MG tablet Take 1 tablet (600 mg total) by mouth every 6 (six) hours as needed. 04/21/22   Fayrene Helper, PA-C  metoprolol succinate (TOPROL-XL) 50 MG 24 hr tablet Take 1 tablet (50  mg total) by mouth daily. 10/21/19   Malvin Johns, MD  naproxen (NAPROSYN) 500 MG tablet Take 1 tablet (500 mg total) by mouth 2 (two) times daily as needed for headache. With food 12/18/19   Wieters, Hallie C, PA-C  omeprazole (PRILOSEC) 20 MG capsule Take 1 capsule (20 mg total) by mouth 2 (two) times daily before a meal for 14 days. 12/18/19 01/01/20  Wieters, Hallie C, PA-C  ondansetron (ZOFRAN ODT) 4 MG disintegrating tablet Take 1 tablet (4 mg total) by mouth every 8 (eight) hours as needed for nausea or vomiting. 12/18/19   Wieters, Hallie C, PA-C  traZODone (DESYREL) 150 MG tablet Take 1 tablet (150 mg total) by mouth at bedtime as needed for sleep. 10/21/19   Malvin Johns, MD  triamterene-hydrochlorothiazide (MAXZIDE-25) 37.5-25 MG tablet Take 1 tablet by mouth daily. 10/21/19   Malvin Johns, MD  pantoprazole (PROTONIX) 20 MG tablet Take 1 tablet (20 mg total) by mouth daily. Patient not taking: Reported on 10/10/2019 01/03/19 12/18/19  Mesner, Barbara Cower, MD      Allergies    Patient has no known allergies.    Review of Systems   Review of Systems  HENT:  Positive for ear pain and sore throat. Negative for ear discharge.   All other systems reviewed and are negative.   Physical Exam Updated Vital Signs BP (!) 182/136 (BP Location: Right Arm)   Pulse 93  Temp 98.6 F (37 C) (Oral)   Resp 16   SpO2 99%  Physical Exam Vitals and nursing note reviewed.  HENT:     Head: Normocephalic and atraumatic.     Right Ear: Hearing normal. No swelling or tenderness. There is impacted cerumen. No mastoid tenderness.     Left Ear: Hearing normal. No swelling or tenderness. There is impacted cerumen. No mastoid tenderness.     Ears:     Comments: Bilateral cerumen impaction Unable to visualize the TM.  There is no external canal redness or swelling.  No mastoid swelling, erythema or tenderness to palpation.    Mouth/Throat:     Mouth: Mucous membranes are moist.     Pharynx: Oropharynx is clear. Uvula  midline. No posterior oropharyngeal erythema.     Tonsils: No tonsillar exudate or tonsillar abscesses. 0 on the right. 0 on the left.  Eyes:     General: No scleral icterus. Pulmonary:     Effort: Pulmonary effort is normal. No respiratory distress.  Skin:    Findings: No rash.  Neurological:     General: No focal deficit present.     Mental Status: She is alert.  Psychiatric:        Mood and Affect: Mood normal.        Behavior: Behavior normal.        Thought Content: Thought content normal.        Judgment: Judgment normal.     ED Results / Procedures / Treatments   Labs (all labs ordered are listed, but only abnormal results are displayed) Labs Reviewed - No data to display  EKG None  Radiology No results found.  Procedures Procedures    Medications Ordered in ED Medications - No data to display  ED Course/ Medical Decision Making/ A&P                           Medical Decision Making Risk OTC drugs. Prescription drug management.  This patient presents to the ED with chief complaint(s) of otalgia with pertinent past medical history of schizophrenia which further complicates the presenting complaint. The complaint involves an extensive differential diagnosis and also carries with it a high risk of complications and morbidity.    The differential diagnosis includes acute otitis media, acute otitis externa, malignant otitis externa, mastoiditis, foreign body, etc.  Additional history obtained: Additional history obtained from  none identified Records reviewed Care Everywhere/External Records and Primary Care Documents ED provider note from 2 days ago  ED Course and Reassessment: 36 year old female who presents to the emergency department with right-sided otalgia.  Bilateral cerumen impaction.  I am unable to visualize the TM.  There is no evidence of acute otitis externa or malignant otitis externa on exam.  Does not appear to have any fungal infection.   There is no mastoid swelling, erythema or tenderness concerning for mastoiditis. On chart review she was seen 2 days ago for the same complaint.  She was given p.o. Augmentin for presumed otitis media.  States that she is taking this medication but question this as she seemed confused initially.  We will prescribe her Debrox drops for the bilateral cerumen impaction.  We will also provide her ofloxacin drops.  I have instructed her to continue Augmentin p.o.  And she verbalized understanding.  We will provide her Cepacol lozenges for sore throat.  There is no evidence of PTA, RPA, tonsillar swelling or exudates concerning for  strep pharyngitis or viral infection.  Is likely eustachian tube drainage causing her symptoms.  Discharge  Independent labs interpretation:  The following labs were independently interpreted: Not indicated  Independent visualization of imaging: Not indicated  Consultation: - Consulted or discussed management/test interpretation w/ external professional: Not indicated  Consideration for admission or further workup: Not indicated Social Determinants of health: Mental health Final Clinical Impression(s) / ED Diagnoses Final diagnoses:  Right ear pain    Rx / DC Orders ED Discharge Orders          Ordered    carbamide peroxide (DEBROX) 6.5 % OTIC solution  2 times daily        04/23/22 0905    ofloxacin (FLOXIN) 0.3 % OTIC solution  2 times daily        04/23/22 0905    menthol-cetylpyridinium (CEPACOL REGULAR STRENGTH) 3 MG lozenge  As needed        04/23/22 0906              Cristopher Peru, PA-C 04/23/22 0912    Cathren Laine, MD 04/23/22 [phone]

## 2022-04-23 NOTE — ED Triage Notes (Signed)
Patient complains of right ear pain with sore throat x 2-3 days. Patient alert and oriented, NAD

## 2023-03-05 DEATH — deceased
